# Patient Record
Sex: Female | Born: 1950 | Race: White | Hispanic: No | Marital: Married | Smoking: Never smoker
Health system: Southern US, Community
[De-identification: ages and names within clinical notes are randomized; demographics above are authoritative.]

## PROBLEM LIST (undated history)

## (undated) DIAGNOSIS — I739 Peripheral vascular disease, unspecified: Secondary | ICD-10-CM

## (undated) DIAGNOSIS — E119 Type 2 diabetes mellitus without complications: Secondary | ICD-10-CM

## (undated) DIAGNOSIS — I639 Cerebral infarction, unspecified: Secondary | ICD-10-CM

## (undated) DIAGNOSIS — E785 Hyperlipidemia, unspecified: Secondary | ICD-10-CM

## (undated) DIAGNOSIS — I10 Essential (primary) hypertension: Secondary | ICD-10-CM

## (undated) DIAGNOSIS — C801 Malignant (primary) neoplasm, unspecified: Secondary | ICD-10-CM

## (undated) DIAGNOSIS — E039 Hypothyroidism, unspecified: Secondary | ICD-10-CM

## (undated) HISTORY — PX: TONSILLECTOMY: SUR1361

## (undated) HISTORY — PX: APPENDECTOMY: SHX54

## (undated) HISTORY — PX: CORONARY ARTERY BYPASS GRAFT: SHX141

## (undated) HISTORY — DX: Cerebral infarction, unspecified: I63.9

## (undated) HISTORY — DX: Peripheral vascular disease, unspecified: I73.9

## (undated) HISTORY — PX: CATARACT EXTRACTION: SUR2

---

## 2007-03-03 ENCOUNTER — Ambulatory Visit: Payer: Self-pay | Admitting: Vascular Surgery

## 2010-06-06 NOTE — Procedures (Signed)
BYPASS GRAFT EVALUATION   INDICATION:  Followup, right fem-pop bypass graft done at [facility].   HISTORY:  Diabetes:  Yes.  Cardiac:  No.  Hypertension:  No.  Smoking:  No.  Previous Surgery:  Please see above.   SINGLE LEVEL ARTERIAL EXAM                               RIGHT              LEFT  Brachial:                                       132  Anterior tibial:             166                159  Posterior tibial:            142                147  Peroneal:  Ankle/brachial index:        1.26               1.20   PREVIOUS ABI:  Date:  RIGHT:  LEFT:   LOWER EXTREMITY BYPASS GRAFT DUPLEX EXAM:   DUPLEX:  Patent right fem-pop bypass graft with no evidence of focal  stenosis.   IMPRESSION:  1. Patent right femoropopliteal bypass graft with no evidence of focal      stenosis.  2. Normal ankle brachial index with biphasic Doppler waveform noted in      bilateral legs.  3. Status post right femoropopliteal bypass graft.  4. Right lesser saphenous vein and distal greater saphenous vein      appears to have no reflux.   ___________________________________________  Nelda Severe. Kellie Simmering, M.D.   MG/MEDQ  D:  03/03/2007  T:  03/04/2007  Job:  [id number]

## 2010-06-06 NOTE — Consult Note (Signed)
VASCULAR SURGERY CONSULTATION   Kendra Walker, Kendra Walker  DOB:  [birth year]                                       03/03/2007  [id number]   Patient is a 60 year old diabetic (type 1) female referred for vascular  consultation by Dr. Isabell Jarvis from the wound center in [place].  This 68-year-  old lady fractured her right ankle in 10/07, requiring surgery with  multiple plates and screws.  She states that she lost blood flow to  her foot two days later, and there was consideration for surgery at that  time, but because of her deteriorating condition, which included renal  insufficiency and hypotension, that was not done at that time.  She  developed some infection and had further surgery on her ankle in the  ensuing days and weeks.  Eventually, she had a vascular evaluation and  underwent a right common femoral to popliteal bypass using an in situ  saphenous vein graft in [place], [place].  She continued to have a  nonhealing ulcer over her right medial malleolus, which has been  debrided by the orthopedic surgeons in the past.  She was also seen by  Dr. Gildardo Cranker recently in evaluation from an orthopedic  standpoint, who did not recommend any further exploration at this time.  She has been treated in the wound center in [place] with hypobaric oxygen  treatments and has had multiple types of graft material apparently  placed in [place], [place] prior to her moving to [place] in 11/08.  She  continues to have a nonhealing area in the right leg.  She has had no  history of deep venous thrombosis, thrombophlebitis, or pulmonary  emboli.   PAST MEDICAL HISTORY:  1. Type 1 diabetes mellitus.  2. Coronary artery disease, status post MI in 04/04.  3. Negative for hypertension, hyperlipidemia, and congestive heart      failure.  She does have COPD.   PREVIOUS SURGERY:  1. Right femoropopliteal bypass graft in 07/08.  2. Coronary artery bypass graft, 04/04.  3. Thyroidectomy.  4.  Tonsillectomy.  5. Appendectomy.   FAMILY HISTORY:  Positive for coronary artery disease in her  grandmother, diabetes in her grandfather.  Negative for stroke.   SOCIAL HISTORY:  She is married and has one child.  Is retired.  She  does not use tobacco or alcohol.   REVIEW OF SYSTEMS:  Please see health history form.   MEDICATIONS:  Please see health history form.   ALLERGIES:  Procrit.   PHYSICAL EXAMINATION:  Blood pressure 122/68.  Heart rate is 98.  Respirations 16.  Generally, she is a middle-aged female who is in no  apparent distress.  She is alert and oriented x3.  Her neck is supple,  3+ carotid pulses are palpable.  No bruits are audible.  Neurologic:  Normal.  No palpable adenopathy in the neck.  Upper extremity pulses are  3+ bilaterally.  Chest:  Clear to auscultation.  Cardiovascular:  Regular rhythm with no murmurs.  Abdomen is soft and nontender with no  masses.  She has a well-healed median sternotomy and incisions.  Extremities exam reveals the right leg to have a 3+ femoral graft pulse  along the medial aspect of the right thigh with a well-perfused right  foot.  She does have incisions from her previous orthopedic procedure  from the ankle fracture.  There is a nonhealing ulcer over the medial  malleolus measuring about 1.5 cm in diameter, which has no purulent  drainage.  No orthopedic hardware is visible.  No bone is visible.  The  left foot is well perfused with a 2+ dorsalis pedis and posterior tibial  pulse palpable.   Lower extremity arterial Dopplers reveal an ABI of 1.2 on the right and  1.2 on the left.  Waveforms are biphasic distally, and her bypass graft  of the right leg is widely patent with no evidence of stenosis.   I reassured her regarding her arterial situation that there is no  evidence of arterial insufficiency, and the graft is working well.  Any  further treatment would be related to this wound and either plastic  surgery or  orthopedic decisions regarding coverage or further  explorations.   Kendra Walker, M.D.  Electronically Signed  JDL/MEDQ  D:  03/03/2007  T:  03/04/2007  Job:  [id number]   cc:   Wynelle Cleveland  Dr. Isabell Jarvis, [facility]

## 2010-11-27 DIAGNOSIS — E611 Iron deficiency: Secondary | ICD-10-CM | POA: Insufficient documentation

## 2010-11-27 DIAGNOSIS — Z951 Presence of aortocoronary bypass graft: Secondary | ICD-10-CM | POA: Insufficient documentation

## 2010-11-27 DIAGNOSIS — E11319 Type 2 diabetes mellitus with unspecified diabetic retinopathy without macular edema: Secondary | ICD-10-CM | POA: Insufficient documentation

## 2010-11-27 DIAGNOSIS — E875 Hyperkalemia: Secondary | ICD-10-CM | POA: Insufficient documentation

## 2011-05-14 DIAGNOSIS — N183 Chronic kidney disease, stage 3 unspecified: Secondary | ICD-10-CM | POA: Insufficient documentation

## 2011-07-19 DIAGNOSIS — S81809A Unspecified open wound, unspecified lower leg, initial encounter: Secondary | ICD-10-CM | POA: Insufficient documentation

## 2011-08-31 DIAGNOSIS — E271 Primary adrenocortical insufficiency: Secondary | ICD-10-CM | POA: Insufficient documentation

## 2011-09-27 DIAGNOSIS — D649 Anemia, unspecified: Secondary | ICD-10-CM | POA: Insufficient documentation

## 2012-01-28 DIAGNOSIS — E039 Hypothyroidism, unspecified: Secondary | ICD-10-CM | POA: Insufficient documentation

## 2013-04-28 ENCOUNTER — Encounter (HOSPITAL_COMMUNITY): Payer: Self-pay | Admitting: Pharmacy Technician

## 2013-05-08 NOTE — Discharge Instructions (Signed)
Kendra Walker  05/08/2013     Instructions    Activity: No Restrictions.   Diet: Resume Diet you were on at home.   Pain Medication: Tylenol if Needed.   CONTACT YOUR DOCTOR IF YOU HAVE PAIN, REDNESS IN YOUR EYE, OR DECREASED VISION.   Follow-up: may 12 th at 1:30  with Williams Che, MD.     Dr. Iona Hansen: [phone]         If you find that you cannot contact your physician, but feel that your signs and   Symptoms warrant a physician's attention, call the Emergency Room at   [phone] ext.[phone].

## 2013-05-11 ENCOUNTER — Ambulatory Visit (HOSPITAL_COMMUNITY)
Admission: RE | Admit: 2013-05-11 | Discharge: 2013-05-11 | Disposition: A | Discharge status: Home / Self Care | Payer: Medicare PPO | Source: Ambulatory Visit | Attending: Ophthalmology | Admitting: Ophthalmology

## 2013-05-11 ENCOUNTER — Encounter (HOSPITAL_COMMUNITY)
Admission: RE | Disposition: A | Discharge status: Home / Self Care | Payer: Self-pay | Source: Ambulatory Visit | Attending: Ophthalmology

## 2013-05-11 ENCOUNTER — Encounter (HOSPITAL_COMMUNITY): Payer: Self-pay | Admitting: *Deleted

## 2013-05-11 DIAGNOSIS — H26499 Other secondary cataract, unspecified eye: Secondary | ICD-10-CM | POA: Diagnosis not present

## 2013-05-11 HISTORY — DX: Hypothyroidism, unspecified: E03.9

## 2013-05-11 HISTORY — PX: YAG LASER APPLICATION: SHX6189

## 2013-05-11 HISTORY — DX: Hyperlipidemia, unspecified: E78.5

## 2013-05-11 HISTORY — DX: Type 2 diabetes mellitus without complications: E11.9

## 2013-05-11 HISTORY — DX: Essential (primary) hypertension: I10

## 2013-05-11 SURGERY — TREATMENT, USING YAG LASER
Anesthesia: LOCAL | Laterality: Right

## 2013-05-11 MED ORDER — TETRACAINE HCL 0.5 % OP SOLN
OPHTHALMIC | Status: AC
  Filled 2013-05-11: qty 2

## 2013-05-11 MED ORDER — APRACLONIDINE HCL 1 % OP SOLN
OPHTHALMIC | Status: AC
  Filled 2013-05-11: qty 0.1

## 2013-05-11 MED ORDER — APRACLONIDINE HCL 1 % OP SOLN
1.0000 [drp] | OPHTHALMIC | Status: AC
  Administered 2013-05-11 (×3): 1 [drp] via OPHTHALMIC

## 2013-05-11 MED ORDER — TROPICAMIDE 1 % OP SOLN
1.0000 [drp] | OPHTHALMIC | Status: AC
  Administered 2013-05-11 (×3): 1 [drp] via OPHTHALMIC

## 2013-05-11 MED ORDER — TROPICAMIDE 1 % OP SOLN
OPHTHALMIC | Status: AC
  Filled 2013-05-11: qty 3

## 2013-05-11 MED ORDER — TETRACAINE HCL 0.5 % OP SOLN
1.0000 [drp] | Freq: Once | OPHTHALMIC | Status: AC
  Administered 2013-05-11: 1 [drp] via OPHTHALMIC

## 2013-05-11 NOTE — Brief Op Note (Signed)
Kendra Walker 05/11/2013  Williams Che, MD  Yag Laser Self Test Completedyes. Procedure: Posterior Capsulotomy, right eye.  Eye Protection Worn by Staff yes. Laser In Use Sign on Door yes.  Laser: Nd:YAG Spot Size: Fixed Burst Mode: III Power Setting: 1.6 mJ/burst Position treated: posterior capsule Number of shots: 38  Total energy delivered: 60.8 mJ  The patient tolerated the procedure without difficulty. No complications were encountered.   Patient verbalizes understanding of discharge instructions yes.   Notes:moderately opacified posterior capsule

## 2013-05-11 NOTE — H&P (Signed)
I have reviewed the pre printed H&P, the patient was re-examined, and I have identified no significant interval changes in the patient's medical condition.  There is no change in the plan of care since the history and physical of record. 

## 2013-05-12 DIAGNOSIS — R8281 Pyuria: Secondary | ICD-10-CM | POA: Insufficient documentation

## 2013-05-13 ENCOUNTER — Encounter (HOSPITAL_COMMUNITY): Payer: Self-pay | Admitting: Ophthalmology

## 2013-05-25 NOTE — Op Note (Signed)
Procedure- no op note required

## 2013-11-11 ENCOUNTER — Encounter: Payer: Self-pay | Admitting: Cardiology

## 2013-11-11 ENCOUNTER — Ambulatory Visit (INDEPENDENT_AMBULATORY_CARE_PROVIDER_SITE_OTHER): Payer: 59 | Admitting: Cardiology

## 2013-11-11 VITALS — BP 113/68 | HR 73 | Ht 61.0 in | Wt 121.0 lb

## 2013-11-11 DIAGNOSIS — I2510 Atherosclerotic heart disease of native coronary artery without angina pectoris: Secondary | ICD-10-CM

## 2013-11-11 DIAGNOSIS — I739 Peripheral vascular disease, unspecified: Secondary | ICD-10-CM | POA: Insufficient documentation

## 2013-11-11 DIAGNOSIS — E785 Hyperlipidemia, unspecified: Secondary | ICD-10-CM | POA: Insufficient documentation

## 2013-11-11 DIAGNOSIS — Z136 Encounter for screening for cardiovascular disorders: Secondary | ICD-10-CM

## 2013-11-11 DIAGNOSIS — I10 Essential (primary) hypertension: Secondary | ICD-10-CM

## 2013-11-11 MED ORDER — ATORVASTATIN CALCIUM 80 MG PO TABS: 80.0000 mg | ORAL_TABLET | Freq: Every day | ORAL | Status: DC

## 2013-11-11 NOTE — Progress Notes (Addendum)
Clinical Summary Ms. Kelsay is a 63 y.o.female seen today for follow up of the following medical problems.   1. CAD -  previously followed by [facility]. From ntoes MI in 2004 requiring 2 vessel CABG (anatomy not described) in [place].  - LVEF March 2011 reported normal LVEF. Echo 04/2011 LVEF 55-60%. - denies any chest pain, denies any significant DOE. Highest level of activity is housework, which she tolerates well. - compliant with meds. She has been on plavix for unclear reasons, the exact history/indication is unclear. Reports heavy bruising on ASA/plavix.    2. PAD - hx of right common femoral to popliteal bypass in [place] - denies any claudication like pain  3. Hyperlipidemia - 07/2013 TC 212 TG 89 HDL 96 LDL 96 - compliant with statin  4. 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 Adrenal insufficiency - followed by endocrine  6.HTN - compliant with meds  7. CKD - followed by pcp  Past Medical History  Diagnosis Date  . Diabetes mellitus without complication   . Hypothyroidism   . Hyperlipemia   . Hypertension      Allergies  Allergen Reactions  . Bactrim [Sulfamethoxazole-Tmp Ds] Other (See Comments)    Elevates potassium.   . Tape Other (See Comments)    not allergic but causes sensitivity.  . Fluorescein Rash  . Procrit [Epoetin (Alfa)] Rash     Current Outpatient Prescriptions  Medication Sig Dispense Refill  . acetaminophen (TYLENOL) 500 MG tablet Take 500 mg by mouth daily as needed for mild pain or headache.      . Ascorbic Acid (VITAMIN C PO) Take 1 tablet by mouth daily.      Marland Kitchen aspirin EC 81 MG tablet Take 81 mg by mouth daily.      . cholecalciferol (VITAMIN D) 1000 UNITS tablet Take 1,000 Units by mouth daily.      . clopidogrel (PLAVIX) 75 MG tablet Take 75 mg by mouth daily with breakfast.      . FLUDROCORTISONE ACETATE PO Take 0.5 tablets by mouth daily.      . insulin aspart (NOVOLOG) 100 UNIT/ML injection Inject  into the skin as directed. Per insulin pump.      . Insulin Human (INSULIN PUMP) SOLN Inject into the skin.      Marland Kitchen iron polysaccharides (FERREX 150) 150 MG capsule Take 150 mg by mouth daily.      Marland Kitchen levothyroxine (SYNTHROID, LEVOTHROID) 125 MCG tablet Take 125 mcg by mouth daily before breakfast.      . magnesium chloride (SLOW-MAG) 64 MG TBEC SR tablet Take 1 tablet by mouth 2 (two) times daily.      . Multiple Vitamin (MULTIVITAMIN WITH MINERALS) TABS tablet Take 1 tablet by mouth daily.      . Multiple Vitamins-Minerals (PRESERVISION AREDS 2 PO) Take 1 tablet by mouth every other day.      . Omega-3 Fatty Acids (FISH OIL) 1000 MG CAPS Take 1 capsule by mouth daily.      . predniSONE (DELTASONE) 5 MG tablet Take 5 mg by mouth daily with breakfast.      . simvastatin (ZOCOR) 40 MG tablet Take 40 mg by mouth at bedtime.      . sodium polystyrene (KAYEXALATE) 15 GM/60ML suspension Take 15 g by mouth once.       No current facility-administered medications for this visit.     Past Surgical History  Procedure Laterality Date  . Tonsillectomy    .  Appendectomy    . Coronary artery bypass graft    . Cataract extraction Bilateral   . Yag laser application Right 0000000    Procedure: YAG LASER APPLICATION;  Surgeon: Williams Che, MD;  Location: [facility];  Service: Ophthalmology;  Laterality: Right;     Allergies  Allergen Reactions  . Bactrim [Sulfamethoxazole-Tmp Ds] Other (See Comments)    Elevates potassium.   . Tape Other (See Comments)    not allergic but causes sensitivity.  . Fluorescein Rash  . Procrit [Epoetin (Alfa)] Rash      No family history on file.   Social History Ms. Muzio reports that she has never smoked. She does not have any smokeless tobacco history on file. Ms. Matusek has no alcohol history on file.   Review of Systems CONSTITUTIONAL: No weight loss, fever, chills, weakness or fatigue.  HEENT: Eyes: No visual loss, blurred vision, double vision or  yellow sclerae.No hearing loss, sneezing, congestion, runny nose or sore throat.  SKIN: No rash or itching.  CARDIOVASCULAR: per HPI RESPIRATORY: No shortness of breath, cough or sputum.  GASTROINTESTINAL: No anorexia, nausea, vomiting or diarrhea. No abdominal pain or blood.  GENITOURINARY: No burning on urination, no polyuria NEUROLOGICAL: No headache, dizziness, syncope, paralysis, ataxia, numbness or tingling in the extremities. No change in bowel or bladder control.  MUSCULOSKELETAL: No muscle, back pain, joint pain or stiffness.  LYMPHATICS: No enlarged nodes. No history of splenectomy.  PSYCHIATRIC: No history of depression or anxiety.  ENDOCRINOLOGIC: No reports of sweating, cold or heat intolerance. No polyuria or polydipsia.  Marland Kitchen   Physical Examination p 73 bp 113/68 Wt 121 lbs BMI 23 Gen: resting comfortably, no acute distress HEENT: no scleral icterus, pupils equal round and reactive, no palptable cervical adenopathy,  CV: RRR, no m/r/g, no JVD, no carotid bruits Resp: Clear to auscultation bilaterally GI: abdomen is soft, non-tender, non-distended, normal bowel sounds, no hepatosplenomegaly MSK: extremities are warm, no edema.  Skin: warm, no rash Neuro:  no focal deficits Psych: appropriate affect   Diagnostic Studies  04/2011 Echo FINDINGS:  LEFT VENTRICLE The left ventricular size is normal. There is normal left ventricular wall  thickness. Left ventricular systolic function is normal. LV ejection fraction  = 55-60%. Left ventricular filling pattern is indeterminate. The left  ventricular wall motion is normal. LV WALL MOTION -  RIGHT VENTRICLE The right ventricle is normal in size and function. The right ventricular  systolic function is normal. The right ventricular wall motion is normal.  LEFT ATRIUM The left atrium is borderline dilated.  RIGHT ATRIUM  Right atrial size is normal. - AORTIC VALVE The aortic valve is normal in structure and function.  The aortic valve opens  well. Trace (trivial) aortic regurgitation. - MITRAL VALVE The mitral valve leaflets appear normal. There is no evidence of stenosis,  fluttering, or prolapse. There is mild mitral regurgitation. - TRICUSPID VALVE Structurally normal tricuspid valve. There is mild tricuspid regurgitation. - PULMONIC VALVE The pulmonic valve is normal in structure and function. Trace pulmonic  valvular regurgitation. - ARTERIES The aortic root is normal size. - VENOUS Pulmonary venous flow pattern is blunted. IVC size was normal. - EFFUSION There is no pericardial effusion. - - MMode/2D Measurements & Calculations IVSd: 0.95 cm LVIDd: 3.9 cm LVPWd: 0.99 cm LVIDs: 2.4 cm LA dim: 3.8 cm Ao root: 2.5 cm EDV(MOD-sp4): 24.0 ml ESV(MOD-sp4): 11.0 ml LVOT diam: 1.5 cm LAV(MOD-sp4): 41.5 ml Doppler Measurements & Calculations MV E max vel: 114.0  cm/sec MV A max vel: 82.4 cm/sec MV E/A: 1.4 Med Peak E' Vel: 7.6 cm/sec Lat Peak E' Vel: 12.2 cm/sec E/Lat E`: 9.4 E/Med E`: 15.0 MV dec time: 0.16 sec SV(LVOT): 45.4 ml Ao mean PG: 7.1 mmHg AVA (VTI): 1.3 cm2 LV V1 VTI: 27.2 cm TR max vel: 230.8 cm/sec TR max PG: 21.3 mmHg RVSP(TR): 26.3 mmHg RAP systole: 5.0 mmHg   11/11/13 EKG NSR  Assessment and Plan   1. CAD - denies any current symptoms, normal LVEF by last echo 2013 - no indication for continued plavix, reports heavy bruising. Will stop. - continue risk factor modification and secondary prevention  2. PAD - no current claudication symptoms, continue risk factor modification and secondary prevention  3. Hyperlipidemia - given known hx of cardiovascular disease, recommendation would be for high dose statin. Stop zocor, start atorva 80 mg daily.  4. HTN - at goal, continue current meds     Arnoldo Lenis, M.D.

## 2013-11-11 NOTE — Patient Instructions (Signed)
   Stop Zocor  Start Atorvastatin 80mg  daily. Sent to pharmacy. Continue all other medications.   Your physician wants you to follow up in:  1 year.  You will receive a reminder letter in the mail one-two months in advance.  If you don't receive a letter, please call our office to schedule the follow up appointment.

## 2014-11-15 ENCOUNTER — Encounter: Payer: Self-pay | Admitting: *Deleted

## 2014-11-15 ENCOUNTER — Ambulatory Visit (INDEPENDENT_AMBULATORY_CARE_PROVIDER_SITE_OTHER): Payer: Medicare HMO | Admitting: Cardiology

## 2014-11-15 ENCOUNTER — Encounter: Payer: Self-pay | Admitting: Cardiology

## 2014-11-15 VITALS — BP 153/72 | HR 72 | Ht 61.0 in | Wt 113.1 lb

## 2014-11-15 DIAGNOSIS — E785 Hyperlipidemia, unspecified: Secondary | ICD-10-CM | POA: Diagnosis not present

## 2014-11-15 DIAGNOSIS — I10 Essential (primary) hypertension: Secondary | ICD-10-CM

## 2014-11-15 DIAGNOSIS — I2510 Atherosclerotic heart disease of native coronary artery without angina pectoris: Secondary | ICD-10-CM | POA: Diagnosis not present

## 2014-11-15 DIAGNOSIS — R011 Cardiac murmur, unspecified: Secondary | ICD-10-CM

## 2014-11-15 DIAGNOSIS — I739 Peripheral vascular disease, unspecified: Secondary | ICD-10-CM

## 2014-11-15 NOTE — Patient Instructions (Signed)
Your physician recommends that you schedule a follow-up appointment in: 6 weeks with Dr. Harl Bowie  Your physician has recommended you make the following change in your medication:   [place] physician has requested that you have an echocardiogram. Echocardiography is a painless test that uses sound waves to create images of your heart. It provides your doctor with information about the size and shape of your heart and how well your heart's chambers and valves are working. This procedure takes approximately one hour. There are no restrictions for this procedure.  Thank you for choosing [facility]!!

## 2014-11-15 NOTE — Progress Notes (Signed)
Patient ID: Kendra Walker, female   DOB: 27-Mar-1950, 63 y.o.   MRN: [id number]     Clinical Summary Kendra Walker is a 64 y.o.female seen today for follow up of the following medical problems.   1. CAD - previously followed by Kendra Walker. From notes MI in 2004 requiring 2 vessel CABG (anatomy not described) in [place] Walker.  - LVEF March 2011 reported normal LVEF. Echo 04/2011 LVEF 55-60%.  - denies any chest pain. Noticed some DOE with activities, example with housework like vacuuming or sweeping. No LE edema, no orthopnea - compliant with meds. She remains on both ASA and plavix. Last clinic note mentions stopping plavix however she reports this was not communicated to her.   2. PAD - hx of right common femoral to popliteal bypass in [place] - denies any claudication like pain. No foot sores  3. Hyperlipidemia - 07/2013 TC 212 TG 89 HDL 96 LDL 96 - compliant with statin  4. 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 Adrenal insufficiency - followed by endocrine  6.HTN - does not check regularly at home - compliant with meds  7. CKD - followed by pcp Past Medical History  Diagnosis Date  . Diabetes mellitus without complication   . Hypothyroidism   . Hyperlipemia   . Hypertension      Allergies  Allergen Reactions  . Bactrim [Sulfamethoxazole-Trimethoprim] Other (See Comments)    Elevates potassium.   . Tape Other (See Comments)    not allergic but causes sensitivity.  . Fluorescein Rash  . Procrit [Epoetin (Alfa)] Rash     Current Outpatient Prescriptions  Medication Sig Dispense Refill  . acetaminophen (TYLENOL) 500 MG tablet Take 500 mg by mouth daily as needed for mild pain or headache.    . Ascorbic Acid (VITAMIN C PO) Take 1 tablet by mouth daily.    Marland Kitchen aspirin EC 81 MG tablet Take 81 mg by mouth daily.    Marland Kitchen atorvastatin (LIPITOR) 80 MG tablet Take 1 tablet (80 mg total) by mouth daily. 30 tablet 6  . cholecalciferol (VITAMIN D) 1000 UNITS  tablet Take 1,000 Units by mouth daily.    . clopidogrel (PLAVIX) 75 MG tablet Take 75 mg by mouth daily with breakfast.    . fludrocortisone (FLORINEF) 0.1 MG tablet Take 0.5 tablets by mouth daily.    . insulin aspart (NOVOLOG) 100 UNIT/ML injection Inject into the skin as directed. Per insulin pump.    . Insulin Human (INSULIN PUMP) SOLN Inject into the skin.    Marland Kitchen iron polysaccharides (FERREX 150) 150 MG capsule Take 150 mg by mouth daily.    Marland Kitchen levothyroxine (SYNTHROID, LEVOTHROID) 125 MCG tablet Take 125 mcg by mouth daily before breakfast.    . magnesium chloride (SLOW-MAG) 64 MG TBEC SR tablet Take 1 tablet by mouth 2 (two) times daily.    . Multiple Vitamin (MULTIVITAMIN WITH MINERALS) TABS tablet Take 1 tablet by mouth daily.    . Multiple Vitamins-Minerals (PRESERVISION AREDS 2 PO) Take 1 tablet by mouth daily.     . Omega-3 Fatty Acids (FISH OIL) 1000 MG CAPS Take 1 capsule by mouth daily.    . predniSONE (DELTASONE) 5 MG tablet Take 5 mg by mouth daily with breakfast.    . sodium polystyrene (KAYEXALATE) 15 GM/60ML suspension Take 15 g by mouth 2 (two) times a week.      No current facility-administered medications for this visit.     Past Surgical History  Procedure Laterality Date  . Tonsillectomy    . Appendectomy    . Coronary artery bypass graft    . Cataract extraction Bilateral   . Yag laser application Right 0000000    Procedure: YAG LASER APPLICATION;  Surgeon: Kendra Che, MD;  Location: [facility];  Service: Ophthalmology;  Laterality: Right;     Allergies  Allergen Reactions  . Bactrim [Sulfamethoxazole-Trimethoprim] Other (See Comments)    Elevates potassium.   . Tape Other (See Comments)    not allergic but causes sensitivity.  . Fluorescein Rash  . Procrit [Epoetin (Alfa)] Rash      No family history on file.   Social History [name] reports that she has never smoked. She has never used smokeless tobacco. [name] has no alcohol history  on file.   Review of Systems CONSTITUTIONAL: No weight loss, fever, chills, weakness or fatigue.  HEENT: Eyes: No visual loss, blurred vision, double vision or yellow sclerae.No hearing loss, sneezing, congestion, runny nose or sore throat.  SKIN: No rash or itching.  CARDIOVASCULAR: per HPI RESPIRATORY: No shortness of breath, cough or sputum.  GASTROINTESTINAL: No anorexia, nausea, vomiting or diarrhea. No abdominal pain or blood.  GENITOURINARY: No burning on urination, no polyuria NEUROLOGICAL: No headache, dizziness, syncope, paralysis, ataxia, numbness or tingling in the extremities. No change in bowel or bladder control.  MUSCULOSKELETAL: No muscle, back pain, joint pain or stiffness.  LYMPHATICS: No enlarged nodes. No history of splenectomy.  PSYCHIATRIC: No history of depression or anxiety.  ENDOCRINOLOGIC: No reports of sweating, cold or heat intolerance. No polyuria or polydipsia.  Marland Kitchen   Physical Examination Filed Vitals:   11/15/14 0853  BP: 153/72  Pulse: 72   Filed Vitals:   11/15/14 0853  Height: 5\' 1"  (1.549 m)  Weight: 113 lb 1.9 oz (51.311 kg)   Manual bp 128/70 Gen: resting comfortably, no acute distress HEENT: no scleral icterus, pupils equal round and reactive, no palptable cervical adenopathy,  CV: RRR, 2/6 systolic murmur RUSB, no jvd Resp: Clear to auscultation bilaterally GI: abdomen is soft, non-tender, non-distended, normal bowel sounds, no hepatosplenomegaly MSK: extremities are warm, no edema.  Skin: warm, no rash Neuro:  no focal deficits Psych: appropriate affect   Diagnostic Studies 04/2011 Echo FINDINGS:  LEFT VENTRICLE The left ventricular size is normal. There is normal left ventricular wall  thickness. Left ventricular systolic function is normal. LV ejection fraction  = 55-60%. Left ventricular filling pattern is indeterminate. The left  ventricular wall motion is normal. LV WALL MOTION -  RIGHT VENTRICLE The right ventricle is  normal in size and function. The right ventricular  systolic function is normal. The right ventricular wall motion is normal.  LEFT ATRIUM The left atrium is borderline dilated.  RIGHT ATRIUM  Right atrial size is normal. - AORTIC VALVE The aortic valve is normal in structure and function. The aortic valve opens  well. Trace (trivial) aortic regurgitation. - MITRAL VALVE The mitral valve leaflets appear normal. There is no evidence of stenosis,  fluttering, or prolapse. There is mild mitral regurgitation. - TRICUSPID VALVE Structurally normal tricuspid valve. There is mild tricuspid regurgitation. - PULMONIC VALVE The pulmonic valve is normal in structure and function. Trace pulmonic  valvular regurgitation. - ARTERIES The aortic root is normal size. - VENOUS Pulmonary venous flow pattern is blunted. IVC size was normal. - EFFUSION There is no pericardial effusion. - - MMode/2D Measurements & Calculations IVSd: 0.95 cm LVIDd: 3.9 cm LVPWd: 0.99 cm  LVIDs: 2.4 cm LA dim: 3.8 cm Ao root: 2.5 cm EDV(MOD-sp4): 24.0 ml ESV(MOD-sp4): 11.0 ml LVOT diam: 1.5 cm LAV(MOD-sp4): 41.5 ml Doppler Measurements & Calculations MV E max vel: 114.0 cm/sec MV A max vel: 82.4 cm/sec MV E/A: 1.4 Med Peak E' Vel: 7.6 cm/sec Lat Peak E' Vel: 12.2 cm/sec E/Lat E`: 9.4 E/Med E`: 15.0 MV dec time: 0.16 sec SV(LVOT): 45.4 ml Ao mean PG: 7.1 mmHg AVA (VTI): 1.3 cm2 LV V1 VTI: 27.2 cm TR max vel: 230.8 cm/sec TR max PG: 21.3 mmHg RVSP(TR): 26.3 mmHg RAP systole: 5.0 mmHg      Assessment and Plan  1. CAD - no chest pain but does note some DOE with recent activities - pending echo result for heart murmur may require stress testing. She reports last stress test was at [facility] several years ago.   2. PAD - no current claudication symptoms, continue risk factor modification and secondary prevention  3. Hyperlipidemia -continue high dose statin.    4. HTN - at goal, not  currently requiring bp meds. Due to adrenal insufficiency prior issues have been more related to hypotension  5. Heart murmur - obtain echo    F/u 6 weeks  Arnoldo Lenis, M.D.,

## 2014-12-01 ENCOUNTER — Other Ambulatory Visit: Payer: Self-pay

## 2014-12-01 ENCOUNTER — Ambulatory Visit (INDEPENDENT_AMBULATORY_CARE_PROVIDER_SITE_OTHER): Payer: Medicare HMO

## 2014-12-01 DIAGNOSIS — R011 Cardiac murmur, unspecified: Secondary | ICD-10-CM | POA: Diagnosis not present

## 2014-12-03 ENCOUNTER — Telehealth: Payer: Self-pay | Admitting: *Deleted

## 2014-12-03 NOTE — Telephone Encounter (Signed)
Pt aware, routed to pcp, confirmed 12/7 appt

## 2014-12-03 NOTE — Telephone Encounter (Signed)
-----   Message from Arnoldo Lenis, MD sent at 12/03/2014 11:36 AM EST ----- Overall echo looks good. Her aortic valve is a little thickened but works well, this is the cause of her murmur. Will discuss results further at f/u  Zandra Abts MD

## 2014-12-29 ENCOUNTER — Encounter: Payer: Self-pay | Admitting: *Deleted

## 2014-12-29 ENCOUNTER — Encounter: Payer: Medicare HMO | Admitting: Cardiology

## 2014-12-29 ENCOUNTER — Encounter: Payer: Self-pay | Admitting: Cardiology

## 2014-12-29 VITALS — BP 103/66 | HR 82 | Ht 61.0 in | Wt 113.0 lb

## 2014-12-29 DIAGNOSIS — I2510 Atherosclerotic heart disease of native coronary artery without angina pectoris: Secondary | ICD-10-CM

## 2014-12-29 DIAGNOSIS — R0602 Shortness of breath: Secondary | ICD-10-CM

## 2014-12-29 NOTE — Patient Instructions (Signed)
Your physician recommends that you schedule a follow-up appointment TO BE DETERMINED AFTER TESTING  Your physician recommends that you continue on your current medications as directed. Please refer to the Current Medication list given to you today.  Your physician has requested that you have a lexiscan myoview. For further information please visit [email or web address]. Please follow instruction sheet, as given.  Thank you for choosing [facility]!!   

## 2014-12-29 NOTE — Progress Notes (Addendum)
Patient ID: Kendra Walker, female   DOB: 12-17-1950, 64 y.o.   MRN: [id number]      Clinical Summary Kendra Walker is a 64 y.o.female seen today for focused visit on her history of CAD and recent chest pain and SOB.   1. CAD - previously followed by [facility]. 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 - LVEF March 2011 reported normal LVEF. Echo 04/2011 LVEF 55-60%. - 11/2014 echo LVEF 60-65%, mod AI  - reports some DOE with activities, example with housework like vacuuming or sweeping. No LE edema, no orthopnea  Past Medical History  Diagnosis Date  . Diabetes mellitus without complication ([place])   . Hypothyroidism   . Hyperlipemia   . Hypertension      Allergies  Allergen Reactions  . Bactrim [Sulfamethoxazole-Trimethoprim] Other (See Comments)    Elevates potassium.   . Tape Other (See Comments)    not allergic but causes sensitivity.  . Fluorescein Rash  . Procrit [Epoetin (Alfa)] Rash     Current Outpatient Prescriptions  Medication Sig Dispense Refill  . acetaminophen (TYLENOL) 500 MG tablet Take 500 mg by mouth daily as needed for mild pain or headache.    . Ascorbic Acid (VITAMIN C PO) Take 1 tablet by mouth daily.    Marland Kitchen aspirin EC 81 MG tablet Take 81 mg by mouth daily.    Marland Kitchen atorvastatin (LIPITOR) 80 MG tablet Take 1 tablet (80 mg total) by mouth daily. 30 tablet 6  . cholecalciferol (VITAMIN D) 1000 UNITS tablet Take 1,000 Units by mouth daily.    . fludrocortisone (FLORINEF) 0.1 MG tablet Take 0.5 tablets by mouth daily.    . insulin aspart (NOVOLOG) 100 UNIT/ML injection Inject into the skin as directed. Per insulin pump.    . Insulin Human (INSULIN PUMP) SOLN Inject into the skin.    Marland Kitchen iron polysaccharides (FERREX 150) 150 MG capsule Take 150 mg by mouth daily.    Marland Kitchen levothyroxine (SYNTHROID, LEVOTHROID) 100 MCG tablet Take 1 tablet by mouth daily.    . magnesium chloride (SLOW-MAG) 64 MG TBEC SR tablet Take 1 tablet by  mouth 3 (three) times daily.     . Multiple Vitamin (MULTIVITAMIN WITH MINERALS) TABS tablet Take 1 tablet by mouth daily.    . Multiple Vitamins-Minerals (PRESERVISION AREDS 2 PO) Take 1 tablet by mouth daily.     . Omega-3 Fatty Acids (FISH OIL) 1000 MG CAPS Take 1 capsule by mouth daily.    . predniSONE (DELTASONE) 5 MG tablet Take 5 mg by mouth daily with breakfast.    . sodium polystyrene (KAYEXALATE) 15 GM/60ML suspension Take 15 g by mouth 2 (two) times a week.      No current facility-administered medications for this visit.     Past Surgical History  Procedure Laterality Date  . Tonsillectomy    . Appendectomy    . Coronary artery bypass graft    . Cataract extraction Bilateral   . Yag laser application Right 0000000    Procedure: YAG LASER APPLICATION;  Surgeon: Williams Che, MD;  Location: [facility];  Service: Ophthalmology;  Laterality: Right;     Allergies  Allergen Reactions  . Bactrim [Sulfamethoxazole-Trimethoprim] Other (See Comments)    Elevates potassium.   . Tape Other (See Comments)    not allergic but causes sensitivity.  . Fluorescein Rash  . Procrit [Epoetin (Alfa)] Rash      No family history on file.  Social History Kendra Walker reports that she has never smoked. She has never used smokeless tobacco. Kendra Walker has no alcohol history on file.   Review of Systems CONSTITUTIONAL: No weight loss, fever, chills, weakness or fatigue.  HEENT: Eyes: No visual loss, blurred vision, double vision or yellow sclerae.No hearing loss, sneezing, congestion, runny nose or sore throat.  SKIN: No rash or itching.  CARDIOVASCULAR: per HPI RESPIRATORY: No cough or sputum.  GASTROINTESTINAL: No anorexia, nausea, vomiting or diarrhea. No abdominal pain or blood.  GENITOURINARY: No burning on urination, no polyuria NEUROLOGICAL: No headache, dizziness, syncope, paralysis, ataxia, numbness or tingling in the extremities. No change in bowel or bladder control.    MUSCULOSKELETAL: No muscle, back pain, joint pain or stiffness.  LYMPHATICS: No enlarged nodes. No history of splenectomy.  PSYCHIATRIC: No history of depression or anxiety.  ENDOCRINOLOGIC: No reports of sweating, cold or heat intolerance. No polyuria or polydipsia.  Marland Kitchen   Physical Examination Filed Vitals:   12/29/14 1043  BP: 103/66  Pulse: 82   Filed Weights   12/29/14 1043  Weight: 113 lb (51.256 kg)    Gen: resting comfortably, no acute distress HEENT: no scleral icterus, pupils equal round and reactive, no palptable cervical adenopathy,  CV: RRR, 2/6 systolic murmur RUSB, no jVD Resp: Clear to auscultation bilaterally GI: abdomen is soft, non-tender, non-distended, normal bowel sounds, no hepatosplenomegaly MSK: extremities are warm, no edema.  Skin: warm, no rash Neuro:  no focal deficits Psych: appropriate affect    Assessment and Plan  1. CAD - recent signiifcant DOE. Echo without potential etiology. Her last stress test was in 2011, a negative DSE at [facility]. Given her symptoms will obtain Lexiscan   F/u pending stress results      Arnoldo Lenis, M.D.

## 2014-12-31 NOTE — Addendum Note (Signed)
Addended by: Carlyle Dolly F on: 12/31/2014 08:04 PM   Modules accepted: Level of Service

## 2015-01-04 ENCOUNTER — Encounter (HOSPITAL_COMMUNITY): Payer: Medicare HMO

## 2015-01-10 ENCOUNTER — Encounter (HOSPITAL_COMMUNITY)
Admission: RE | Admit: 2015-01-10 | Discharge: 2015-01-10 | Disposition: A | Discharge status: Home / Self Care | Payer: Medicare HMO | Source: Ambulatory Visit | Attending: Cardiology | Admitting: Cardiology

## 2015-01-10 ENCOUNTER — Inpatient Hospital Stay (HOSPITAL_COMMUNITY): Admission: RE | Admit: 2015-01-10 | Payer: Medicare HMO | Source: Ambulatory Visit

## 2015-01-10 ENCOUNTER — Encounter (HOSPITAL_COMMUNITY): Payer: Self-pay

## 2015-01-10 DIAGNOSIS — R0602 Shortness of breath: Secondary | ICD-10-CM | POA: Insufficient documentation

## 2015-01-10 HISTORY — DX: Malignant (primary) neoplasm, unspecified: C80.1

## 2015-01-10 LAB — NM MYOCAR MULTI W/SPECT W/WALL MOTION / EF
LV dias vol: 51 mL
LV sys vol: 15 mL
Peak HR: 93 {beats}/min
RATE: 0
Rest HR: 74 {beats}/min
SDS: 2
SRS: 0
SSS: 2
TID: 0.98

## 2015-01-10 IMAGING — NM NM MYOCAR MULTI W/SPECT W/WALL MOTION & EF
2 series · 12 of 12 positions shown · non-contrast
Comparison: none

[Series 1: rest · 8.28mm/px · 6 of 64 frames shown]
[frame 6/64]
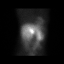
[frame 16/64]
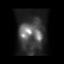
[frame 27/64]
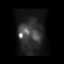
[frame 38/64]
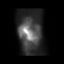
[frame 48/64]
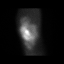
[frame 59/64]
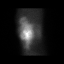

[Series 2: stress gated · 8.28mm/px · 6 of 64 frames shown]
[frame 6/64]
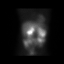
[frame 16/64]
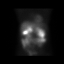
[frame 27/64]
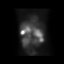
[frame 38/64]
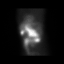
[frame 48/64]
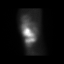
[frame 59/64]
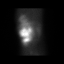

[12 of 12 positions shown; findings below may reference images not displayed]

Canned report from images found in remote index.

Refer to host system for actual result text.

## 2015-01-10 MED ORDER — SODIUM CHLORIDE 0.9 % IJ SOLN
INTRAMUSCULAR | Status: AC
  Administered 2015-01-10: 10 mL via INTRAVENOUS
  Filled 2015-01-10: qty 3

## 2015-01-10 MED ORDER — REGADENOSON 0.4 MG/5ML IV SOLN
INTRAVENOUS | Status: AC
  Administered 2015-01-10: 0.4 mg via INTRAVENOUS
  Filled 2015-01-10: qty 5

## 2015-01-10 MED ORDER — TECHNETIUM TC 99M SESTAMIBI - CARDIOLITE
10.0000 | Freq: Once | INTRAVENOUS | Status: AC | PRN
  Administered 2015-01-10: 10.8 via INTRAVENOUS

## 2015-01-10 MED ORDER — TECHNETIUM TC 99M SESTAMIBI GENERIC - CARDIOLITE
30.0000 | Freq: Once | INTRAVENOUS | Status: AC | PRN
  Administered 2015-01-10: 30 via INTRAVENOUS

## 2015-01-11 ENCOUNTER — Telehealth: Payer: Self-pay | Admitting: *Deleted

## 2015-01-11 NOTE — Telephone Encounter (Signed)
Pt aware, routed to pcp, scheduled 6 week f/u

## 2015-01-11 NOTE — Telephone Encounter (Signed)
-----   Message from Arnoldo Lenis, MD sent at 01/11/2015  1:04 PM EST ----- Stress test looks good, no evidence of new blockages. I would ask she follow up with her pcp to discuss noncardiac causes of her SOB as her heart studies have come back ok. I'd like to see her back in 6 weeks.   Zandra Abts MD

## 2015-01-27 DIAGNOSIS — E271 Primary adrenocortical insufficiency: Secondary | ICD-10-CM | POA: Diagnosis not present

## 2015-01-27 DIAGNOSIS — Z9641 Presence of insulin pump (external) (internal): Secondary | ICD-10-CM | POA: Diagnosis not present

## 2015-01-27 DIAGNOSIS — E2740 Unspecified adrenocortical insufficiency: Secondary | ICD-10-CM | POA: Diagnosis not present

## 2015-01-27 DIAGNOSIS — N189 Chronic kidney disease, unspecified: Secondary | ICD-10-CM | POA: Diagnosis not present

## 2015-01-27 DIAGNOSIS — E10319 Type 1 diabetes mellitus with unspecified diabetic retinopathy without macular edema: Secondary | ICD-10-CM | POA: Diagnosis not present

## 2015-01-27 DIAGNOSIS — E1065 Type 1 diabetes mellitus with hyperglycemia: Secondary | ICD-10-CM | POA: Diagnosis not present

## 2015-02-22 DIAGNOSIS — E039 Hypothyroidism, unspecified: Secondary | ICD-10-CM | POA: Diagnosis not present

## 2015-02-22 DIAGNOSIS — E1121 Type 2 diabetes mellitus with diabetic nephropathy: Secondary | ICD-10-CM | POA: Diagnosis not present

## 2015-02-22 DIAGNOSIS — I10 Essential (primary) hypertension: Secondary | ICD-10-CM | POA: Diagnosis not present

## 2015-02-22 DIAGNOSIS — D631 Anemia in chronic kidney disease: Secondary | ICD-10-CM | POA: Diagnosis not present

## 2015-02-22 DIAGNOSIS — E782 Mixed hyperlipidemia: Secondary | ICD-10-CM | POA: Diagnosis not present

## 2015-02-23 ENCOUNTER — Encounter: Payer: Self-pay | Admitting: Cardiology

## 2015-02-23 ENCOUNTER — Encounter: Payer: Self-pay | Admitting: *Deleted

## 2015-02-23 ENCOUNTER — Ambulatory Visit (INDEPENDENT_AMBULATORY_CARE_PROVIDER_SITE_OTHER): Payer: Medicare Other | Admitting: Cardiology

## 2015-02-23 VITALS — BP 125/76 | HR 76 | Ht 61.0 in | Wt 118.0 lb

## 2015-02-23 DIAGNOSIS — I10 Essential (primary) hypertension: Secondary | ICD-10-CM

## 2015-02-23 DIAGNOSIS — I2510 Atherosclerotic heart disease of native coronary artery without angina pectoris: Secondary | ICD-10-CM

## 2015-02-23 DIAGNOSIS — E785 Hyperlipidemia, unspecified: Secondary | ICD-10-CM

## 2015-02-23 DIAGNOSIS — I739 Peripheral vascular disease, unspecified: Secondary | ICD-10-CM

## 2015-02-23 NOTE — Progress Notes (Signed)
Patient ID: Kendra Walker, female   DOB: [birth year], 65 y.o.   MRN: [id number]     Clinical Summary Kendra Walker is a 65 y.o.female seen today for follow up of the following medical problems.   1. CAD - previously followed by [facility]. 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  - LVEF March 2011 reported normal LVEF. Echo 04/2011 LVEF 55-60%. - 12/2014 Lexiscan without ischemia - since last visit breathing somewhat improved. Reports deconditioning due to lack of exercise.  - compliant with meds  2. PAD - hx of right common femoral to popliteal bypass in [place] - she has not had regular vascular follow up since her surgery  - denies any claudication like pain. No foot sores  3. Hyperlipidemia - 07/2013 TC 212 TG 89 HDL 96 LDL 96 - compliant with statin  4. 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 Adrenal insufficiency - followed by endocrine  6.HTN - does not check regularly at home - compliant with meds  7. CKD - followed at [facility] Past Medical History  Diagnosis Date  . Diabetes mellitus without complication ([place])   . Hypothyroidism   . Hyperlipemia   . Hypertension   . Cancer (H[facility])     Squamous Cell     Allergies  Allergen Reactions  . Bactrim [Sulfamethoxazole-Trimethoprim] Other (See Comments)    Elevates potassium.   . Tape Other (See Comments)    not allergic but causes sensitivity.  . Fluorescein Rash  . Procrit [Epoetin (Alfa)] Rash     Current Outpatient Prescriptions  Medication Sig Dispense Refill  . acetaminophen (TYLENOL) 500 MG tablet Take 500 mg by mouth daily as needed for mild pain or headache.    . Ascorbic Acid (VITAMIN C PO) Take 1 tablet by mouth daily.    Marland Kitchen aspirin EC 81 MG tablet Take 81 mg by mouth daily.    Marland Kitchen atorvastatin (LIPITOR) 80 MG tablet Take 1 tablet (80 mg total) by mouth daily. 30 tablet 6  . cholecalciferol (VITAMIN D) 1000 UNITS tablet Take 1,000 Units by  mouth daily.    . fludrocortisone (FLORINEF) 0.1 MG tablet Take 0.5 tablets by mouth daily.    . insulin aspart (NOVOLOG) 100 UNIT/ML injection Inject into the skin as directed. Per insulin pump.    . Insulin Human (INSULIN PUMP) SOLN Inject into the skin.    Marland Kitchen iron polysaccharides (FERREX 150) 150 MG capsule Take 150 mg by mouth daily.    Marland Kitchen levothyroxine (SYNTHROID, LEVOTHROID) 100 MCG tablet Take 1 tablet by mouth daily.    . magnesium chloride (SLOW-MAG) 64 MG TBEC SR tablet Take 1 tablet by mouth 3 (three) times daily.     . Multiple Vitamin (MULTIVITAMIN WITH MINERALS) TABS tablet Take 1 tablet by mouth daily.    . Multiple Vitamins-Minerals (PRESERVISION AREDS 2 PO) Take 1 tablet by mouth daily.     . Omega-3 Fatty Acids (FISH OIL) 1000 MG CAPS Take 1 capsule by mouth daily.    . predniSONE (DELTASONE) 5 MG tablet Take 5 mg by mouth daily with breakfast.    . sodium polystyrene (KAYEXALATE) 15 GM/60ML suspension Take 15 g by mouth 2 (two) times a week.      No current facility-administered medications for this visit.     Past Surgical History  Procedure Laterality Date  . Tonsillectomy    . Appendectomy    . Coronary artery bypass graft    .  Cataract extraction Bilateral   . Yag laser application Right 0000000    Procedure: YAG LASER APPLICATION;  Surgeon: Williams Che, MD;  Location: [facility];  Service: Ophthalmology;  Laterality: Right;     Allergies  Allergen Reactions  . Bactrim [Sulfamethoxazole-Trimethoprim] Other (See Comments)    Elevates potassium.   . Tape Other (See Comments)    not allergic but causes sensitivity.  . Fluorescein Rash  . Procrit [Epoetin (Alfa)] Rash      No family history on file.   Social History Kendra Walker reports that she has never smoked. She has never used smokeless tobacco. Kendra Walker has no alcohol history on file.   Review of Systems CONSTITUTIONAL: No weight loss, fever, chills, weakness or fatigue.  HEENT: Eyes: No  visual loss, blurred vision, double vision or yellow sclerae.No hearing loss, sneezing, congestion, runny nose or sore throat.  SKIN: No rash or itching.  CARDIOVASCULAR: per HPI RESPIRATORY: No cough or sputum.  GASTROINTESTINAL: No anorexia, nausea, vomiting or diarrhea. No abdominal pain or blood.  GENITOURINARY: No burning on urination, no polyuria NEUROLOGICAL: No headache, dizziness, syncope, paralysis, ataxia, numbness or tingling in the extremities. No change in bowel or bladder control.  MUSCULOSKELETAL: No muscle, back pain, joint pain or stiffness.  LYMPHATICS: No enlarged nodes. No history of splenectomy.  PSYCHIATRIC: No history of depression or anxiety.  ENDOCRINOLOGIC: No reports of sweating, cold or heat intolerance. No polyuria or polydipsia.  Marland Kitchen   Physical Examination Filed Vitals:   02/23/15 1424  BP: 125/76  Pulse: 76   Filed Vitals:   02/23/15 1424  Height: 5\' 1"  (1.549 m)  Weight: 118 lb (53.524 kg)    Gen: resting comfortably, no acute distress HEENT: no scleral icterus, pupils equal round and reactive, no palptable cervical adenopathy,  CV: RRR, no m/r/g, no jvd Resp: Clear to auscultation bilaterally GI: abdomen is soft, non-tender, non-distended, normal bowel sounds, no hepatosplenomegaly MSK: extremities are warm, no edema.  Skin: warm, no rash Neuro:  no focal deficits Psych: appropriate affect   Diagnostic Studies 04/2011 Echo FINDINGS:  LEFT VENTRICLE The left ventricular size is normal. There is normal left ventricular wall  thickness. Left ventricular systolic function is normal. LV ejection fraction  = 55-60%. Left ventricular filling pattern is indeterminate. The left  ventricular wall motion is normal. LV WALL MOTION -  RIGHT VENTRICLE The right ventricle is normal in size and function. The right ventricular  systolic function is normal. The right ventricular wall motion is normal.  LEFT ATRIUM The left atrium is borderline  dilated.  RIGHT ATRIUM  Right atrial size is normal. - AORTIC VALVE The aortic valve is normal in structure and function. The aortic valve opens  well. Trace (trivial) aortic regurgitation. - MITRAL VALVE The mitral valve leaflets appear normal. There is no evidence of stenosis,  fluttering, or prolapse. There is mild mitral regurgitation. - TRICUSPID VALVE Structurally normal tricuspid valve. There is mild tricuspid regurgitation. - PULMONIC VALVE The pulmonic valve is normal in structure and function. Trace pulmonic  valvular regurgitation. - ARTERIES The aortic root is normal size. - VENOUS Pulmonary venous flow pattern is blunted. IVC size was normal. - EFFUSION There is no pericardial effusion. - - MMode/2D Measurements & Calculations IVSd: 0.95 cm LVIDd: 3.9 cm LVPWd: 0.99 cm LVIDs: 2.4 cm LA dim: 3.8 cm Ao root: 2.5 cm EDV(MOD-sp4): 24.0 ml ESV(MOD-sp4): 11.0 ml LVOT diam: 1.5 cm LAV(MOD-sp4): 41.5 ml Doppler Measurements & Calculations MV E max vel:  114.0 cm/sec MV A max vel: 82.4 cm/sec MV E/A: 1.4 Med Peak E' Vel: 7.6 cm/sec Lat Peak E' Vel: 12.2 cm/sec E/Lat E`: 9.4 E/Med E`: 15.0 MV dec time: 0.16 sec SV(LVOT): 45.4 ml Ao mean PG: 7.1 mmHg AVA (VTI): 1.3 cm2 LV V1 VTI: 27.2 cm TR max vel: 230.8 cm/sec TR max PG: 21.3 mmHg RVSP(TR): 26.3 mmHg RAP systole: 5.0 mmHg   11/2014 echo Study Conclusions  - Left ventricle: The cavity size was normal. Systolic function was normal. The estimated ejection fraction was in the range of 60% to 65%. Wall motion was normal; there were no regional wall motion abnormalities. Diastolic dysfunction, grade indeterminate. Medial annular velocity is slightly low with elevated E/e&' ratio of 18, indicative of elevated filling pressures. Mild to moderate concentric left ventricular hypertrophy. - Aortic valve: Moderately calcified annulus. Trileaflet. There was mild regurgitation. - Mitral  valve: Mildly calcified annulus. Mildly thickened leaflets . There was trivial regurgitation. - Right ventricle: Systolic function was normal. TAPSE: 17.6 mm . - Tricuspid valve: There was mild-moderate regurgitation. - Pulmonary arteries: Systolic pressure was mildly increased. PA peak pressure: 35 mm Hg (S).   12/2014 Lexiscan MPI  No diagnostic ST segment abnormalities. Rare PACs.  Very small fixed perfusion defect at the inferior apex most consistent with attenuation artifact rather than scar. No ischemic findings.  This is a low risk study.  Nuclear stress EF: 70%.    Assessment and Plan   1. CAD - recent lexiscan without ischemia - SOB is improving since last visit, probable deconditioning as the etiology. No evidence of significant cardiac pathology at this time.   2. PAD - no current claudication symptoms - we will refer her to vascular in [place] given her prior bypass in 2008, she has not had any follow up since that time.   3. Hyperlipidemia -continue high dose statin.  - request labs from pcp  4. HTN - at goal, not currently requiring bp meds. Due to adrenal insufficiency prior issues have been more related to hypotension       Arnoldo Lenis, M.D.

## 2015-02-23 NOTE — Patient Instructions (Signed)
Your physician wants you to follow-up in: [place] DR. BRANCH You will receive a reminder letter in the mail two months in advance. If you don't receive a letter, please call our office to schedule the follow-up appointment.  Your physician recommends that you continue on your current medications as directed. Please refer to the Current Medication list given to you today.  You have been referred to VASCULAR   Thank you for choosing [place]!!

## 2015-03-01 ENCOUNTER — Other Ambulatory Visit: Payer: Self-pay | Admitting: *Deleted

## 2015-03-01 DIAGNOSIS — E1121 Type 2 diabetes mellitus with diabetic nephropathy: Secondary | ICD-10-CM | POA: Diagnosis not present

## 2015-03-01 DIAGNOSIS — E279 Disorder of adrenal gland, unspecified: Secondary | ICD-10-CM | POA: Diagnosis not present

## 2015-03-01 DIAGNOSIS — E039 Hypothyroidism, unspecified: Secondary | ICD-10-CM | POA: Diagnosis not present

## 2015-03-01 DIAGNOSIS — I739 Peripheral vascular disease, unspecified: Secondary | ICD-10-CM

## 2015-03-01 DIAGNOSIS — E782 Mixed hyperlipidemia: Secondary | ICD-10-CM | POA: Diagnosis not present

## 2015-03-01 DIAGNOSIS — E1169 Type 2 diabetes mellitus with other specified complication: Secondary | ICD-10-CM | POA: Diagnosis not present

## 2015-03-14 ENCOUNTER — Other Ambulatory Visit: Payer: Self-pay

## 2015-03-14 DIAGNOSIS — I7092 Chronic total occlusion of artery of the extremities: Secondary | ICD-10-CM

## 2015-03-24 DIAGNOSIS — I739 Peripheral vascular disease, unspecified: Secondary | ICD-10-CM | POA: Diagnosis not present

## 2015-03-24 DIAGNOSIS — E1142 Type 2 diabetes mellitus with diabetic polyneuropathy: Secondary | ICD-10-CM | POA: Diagnosis not present

## 2015-03-24 DIAGNOSIS — M2041 Other hammer toe(s) (acquired), right foot: Secondary | ICD-10-CM | POA: Diagnosis not present

## 2015-03-24 DIAGNOSIS — M2042 Other hammer toe(s) (acquired), left foot: Secondary | ICD-10-CM | POA: Diagnosis not present

## 2015-03-24 DIAGNOSIS — B351 Tinea unguium: Secondary | ICD-10-CM | POA: Diagnosis not present

## 2015-04-25 ENCOUNTER — Encounter (HOSPITAL_COMMUNITY): Payer: Medicare Other

## 2015-04-25 ENCOUNTER — Other Ambulatory Visit (HOSPITAL_COMMUNITY): Payer: Medicare Other

## 2015-04-25 ENCOUNTER — Encounter: Payer: Medicare Other | Admitting: Surgery

## 2015-04-29 DIAGNOSIS — Z794 Long term (current) use of insulin: Secondary | ICD-10-CM | POA: Diagnosis not present

## 2015-04-29 DIAGNOSIS — E108 Type 1 diabetes mellitus with unspecified complications: Secondary | ICD-10-CM | POA: Diagnosis not present

## 2015-05-12 DIAGNOSIS — E271 Primary adrenocortical insufficiency: Secondary | ICD-10-CM | POA: Diagnosis not present

## 2015-05-12 DIAGNOSIS — E109 Type 1 diabetes mellitus without complications: Secondary | ICD-10-CM | POA: Diagnosis not present

## 2015-05-12 DIAGNOSIS — Z9641 Presence of insulin pump (external) (internal): Secondary | ICD-10-CM | POA: Diagnosis not present

## 2015-05-12 DIAGNOSIS — E2740 Unspecified adrenocortical insufficiency: Secondary | ICD-10-CM | POA: Diagnosis not present

## 2015-05-12 DIAGNOSIS — Z7952 Long term (current) use of systemic steroids: Secondary | ICD-10-CM | POA: Diagnosis not present

## 2015-05-12 DIAGNOSIS — Z794 Long term (current) use of insulin: Secondary | ICD-10-CM | POA: Diagnosis not present

## 2015-05-12 DIAGNOSIS — N189 Chronic kidney disease, unspecified: Secondary | ICD-10-CM | POA: Diagnosis not present

## 2015-05-12 DIAGNOSIS — E1022 Type 1 diabetes mellitus with diabetic chronic kidney disease: Secondary | ICD-10-CM | POA: Diagnosis not present

## 2015-05-12 DIAGNOSIS — E1065 Type 1 diabetes mellitus with hyperglycemia: Secondary | ICD-10-CM | POA: Diagnosis not present

## 2015-06-16 DIAGNOSIS — E10319 Type 1 diabetes mellitus with unspecified diabetic retinopathy without macular edema: Secondary | ICD-10-CM | POA: Diagnosis not present

## 2015-06-16 DIAGNOSIS — I252 Old myocardial infarction: Secondary | ICD-10-CM | POA: Diagnosis not present

## 2015-06-16 DIAGNOSIS — N183 Chronic kidney disease, stage 3 (moderate): Secondary | ICD-10-CM | POA: Diagnosis not present

## 2015-06-16 DIAGNOSIS — E271 Primary adrenocortical insufficiency: Secondary | ICD-10-CM | POA: Diagnosis not present

## 2015-06-16 DIAGNOSIS — R809 Proteinuria, unspecified: Secondary | ICD-10-CM | POA: Diagnosis not present

## 2015-06-16 DIAGNOSIS — Z7952 Long term (current) use of systemic steroids: Secondary | ICD-10-CM | POA: Diagnosis not present

## 2015-06-16 DIAGNOSIS — Z794 Long term (current) use of insulin: Secondary | ICD-10-CM | POA: Diagnosis not present

## 2015-06-16 DIAGNOSIS — D649 Anemia, unspecified: Secondary | ICD-10-CM | POA: Diagnosis not present

## 2015-06-16 DIAGNOSIS — E2740 Unspecified adrenocortical insufficiency: Secondary | ICD-10-CM | POA: Diagnosis not present

## 2015-06-16 DIAGNOSIS — I129 Hypertensive chronic kidney disease with stage 1 through stage 4 chronic kidney disease, or unspecified chronic kidney disease: Secondary | ICD-10-CM | POA: Diagnosis not present

## 2015-06-16 DIAGNOSIS — E1022 Type 1 diabetes mellitus with diabetic chronic kidney disease: Secondary | ICD-10-CM | POA: Diagnosis not present

## 2015-06-16 DIAGNOSIS — E1042 Type 1 diabetes mellitus with diabetic polyneuropathy: Secondary | ICD-10-CM | POA: Diagnosis not present

## 2015-06-16 DIAGNOSIS — Z79899 Other long term (current) drug therapy: Secondary | ICD-10-CM | POA: Diagnosis not present

## 2015-06-16 DIAGNOSIS — E785 Hyperlipidemia, unspecified: Secondary | ICD-10-CM | POA: Diagnosis not present

## 2015-06-16 DIAGNOSIS — E875 Hyperkalemia: Secondary | ICD-10-CM | POA: Diagnosis not present

## 2015-06-16 DIAGNOSIS — Z951 Presence of aortocoronary bypass graft: Secondary | ICD-10-CM | POA: Diagnosis not present

## 2015-07-07 DIAGNOSIS — M2041 Other hammer toe(s) (acquired), right foot: Secondary | ICD-10-CM | POA: Diagnosis not present

## 2015-07-07 DIAGNOSIS — E1142 Type 2 diabetes mellitus with diabetic polyneuropathy: Secondary | ICD-10-CM | POA: Diagnosis not present

## 2015-07-07 DIAGNOSIS — B351 Tinea unguium: Secondary | ICD-10-CM | POA: Diagnosis not present

## 2015-07-07 DIAGNOSIS — M2042 Other hammer toe(s) (acquired), left foot: Secondary | ICD-10-CM | POA: Diagnosis not present

## 2015-07-07 DIAGNOSIS — I739 Peripheral vascular disease, unspecified: Secondary | ICD-10-CM | POA: Diagnosis not present

## 2015-07-11 DIAGNOSIS — Z1231 Encounter for screening mammogram for malignant neoplasm of breast: Secondary | ICD-10-CM | POA: Diagnosis not present

## 2015-07-27 DIAGNOSIS — E109 Type 1 diabetes mellitus without complications: Secondary | ICD-10-CM | POA: Diagnosis not present

## 2015-08-10 DIAGNOSIS — Z794 Long term (current) use of insulin: Secondary | ICD-10-CM | POA: Diagnosis not present

## 2015-08-10 DIAGNOSIS — E108 Type 1 diabetes mellitus with unspecified complications: Secondary | ICD-10-CM | POA: Diagnosis not present

## 2015-08-23 ENCOUNTER — Ambulatory Visit (INDEPENDENT_AMBULATORY_CARE_PROVIDER_SITE_OTHER): Payer: Medicare Other | Admitting: Cardiology

## 2015-08-23 ENCOUNTER — Encounter: Payer: Self-pay | Admitting: Cardiology

## 2015-08-23 VITALS — BP 110/66 | HR 69 | Ht 61.0 in | Wt 118.0 lb

## 2015-08-23 DIAGNOSIS — I2510 Atherosclerotic heart disease of native coronary artery without angina pectoris: Secondary | ICD-10-CM

## 2015-08-23 DIAGNOSIS — E785 Hyperlipidemia, unspecified: Secondary | ICD-10-CM

## 2015-08-23 DIAGNOSIS — I10 Essential (primary) hypertension: Secondary | ICD-10-CM

## 2015-08-23 DIAGNOSIS — I739 Peripheral vascular disease, unspecified: Secondary | ICD-10-CM | POA: Diagnosis not present

## 2015-08-23 NOTE — Progress Notes (Signed)
Clinical Summary Ms. Bota is a 65 y.o.female seen today for follow up of the following medical problems.   1. CAD - previously followed by [facility]. 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  - LVEF March 2011 reported normal LVEF. Echo 04/2011 LVEF 55-60%. - 12/2014 Lexiscan without ischemia - breathing overall stable since last visit. Fairly sedentary lifestyle due to an old ankle injru. No recent chest pain - compliant with meds  2. PAD - hx of right common femoral to popliteal bypass in [place] - denies any claudication like pain. No foot sores. Has upcoming appt with vascular.   3. Hyperlipidemia - 02/2015 TC 148 TG 38 HDL 104 LDL 36 - compliant with statin  4. 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 Adrenal insufficiency - followed by endocrine  6.HTN - does not check regularly at home -she is compliant with meds  7. CKD - followed at [facility] Past Medical History:  Diagnosis Date  . Cancer (HCC)    Squamous Cell  . Diabetes mellitus without complication ([place])   . Hyperlipemia   . Hypertension   . Hypothyroidism      Allergies  Allergen Reactions  . Bactrim [Sulfamethoxazole-Trimethoprim] Other (See Comments)    Elevates potassium.   . Tape Other (See Comments)    not allergic but causes sensitivity.  . Fluorescein Rash  . Procrit [Epoetin (Alfa)] Rash     Current Outpatient Prescriptions  Medication Sig Dispense Refill  . acetaminophen (TYLENOL) 500 MG tablet Take 500 mg by mouth daily as needed for mild pain or headache.    . Ascorbic Acid (VITAMIN C PO) Take 1 tablet by mouth daily.    Marland Kitchen aspirin EC 81 MG tablet Take 81 mg by mouth daily.    Marland Kitchen atorvastatin (LIPITOR) 80 MG tablet Take 1 tablet (80 mg total) by mouth daily. 30 tablet 6  . cholecalciferol (VITAMIN D) 1000 UNITS tablet Take 1,000 Units by mouth daily.    . fludrocortisone (FLORINEF) 0.1 MG tablet Take 0.5  tablets by mouth daily.    . insulin aspart (NOVOLOG) 100 UNIT/ML injection Inject into the skin as directed. Per insulin pump.    . iron polysaccharides (FERREX 150) 150 MG capsule Take 150 mg by mouth daily.    Marland Kitchen levothyroxine (SYNTHROID, LEVOTHROID) 100 MCG tablet Take 1 tablet by mouth daily.    . magnesium chloride (SLOW-MAG) 64 MG TBEC SR tablet Take 1 tablet by mouth 3 (three) times daily.     . Multiple Vitamin (MULTIVITAMIN WITH MINERALS) TABS tablet Take 1 tablet by mouth daily.    . Multiple Vitamins-Minerals (PRESERVISION AREDS 2 PO) Take 1 tablet by mouth daily.     . Omega-3 Fatty Acids (FISH OIL) 1000 MG CAPS Take 1 capsule by mouth daily.    . predniSONE (DELTASONE) 5 MG tablet Take 5 mg by mouth daily with breakfast.    . sodium polystyrene (KAYEXALATE) 15 GM/60ML suspension Take 15 g by mouth 2 (two) times a week.      No current facility-administered medications for this visit.      Past Surgical History:  Procedure Laterality Date  . APPENDECTOMY    . CATARACT EXTRACTION Bilateral   . CORONARY ARTERY BYPASS GRAFT    . TONSILLECTOMY    . YAG LASER APPLICATION Right 0000000   Procedure: YAG LASER APPLICATION;  Surgeon: Williams Che, MD;  Location: [facility];  Service: Ophthalmology;  Laterality: Right;     Allergies  Allergen Reactions  . Bactrim [Sulfamethoxazole-Trimethoprim] Other (See Comments)    Elevates potassium.   . Tape Other (See Comments)    not allergic but causes sensitivity.  . Fluorescein Rash  . Procrit [Epoetin (Alfa)] Rash      No family history on file.   Social History Ms. Reeb reports that she has never smoked. She has never used smokeless tobacco. Ms. Mcglocklin has no alcohol history on file.   Review of Systems CONSTITUTIONAL: No weight loss, fever, chills, weakness or fatigue.  HEENT: Eyes: No visual loss, blurred vision, double vision or yellow sclerae.No hearing loss, sneezing, congestion, runny nose or sore throat.    SKIN: No rash or itching.  CARDIOVASCULAR: per HPI RESPIRATORY: No shortness of breath, cough or sputum.  GASTROINTESTINAL: No anorexia, nausea, vomiting or diarrhea. No abdominal pain or blood.  GENITOURINARY: No burning on urination, no polyuria NEUROLOGICAL: No headache, dizziness, syncope, paralysis, ataxia, numbness or tingling in the extremities. No change in bowel or bladder control.  MUSCULOSKELETAL: No muscle, back pain, joint pain or stiffness.  LYMPHATICS: No enlarged nodes. No history of splenectomy.  PSYCHIATRIC: No history of depression or anxiety.  ENDOCRINOLOGIC: No reports of sweating, cold or heat intolerance. No polyuria or polydipsia.  Marland Kitchen   Physical Examination Vitals:   08/23/15 1043  BP: 110/66  Pulse: 69   Vitals:   08/23/15 1043  Weight: 118 lb (53.5 kg)  Height: 5\' 1"  (1.549 m)    Gen: resting comfortably, no acute distress HEENT: no scleral icterus, pupils equal round and reactive, no palptable cervical adenopathy,  CV: RRR, no m/r/g, no jvd Resp: Clear to auscultation bilaterally GI: abdomen is soft, non-tender, non-distended, normal bowel sounds, no hepatosplenomegaly MSK: extremities are warm, no edema.  Skin: warm, no rash Neuro:  no focal deficits Psych: appropriate affect   Diagnostic Studies 04/2011 Echo FINDINGS:  LEFT VENTRICLE The left ventricular size is normal. There is normal left ventricular wall  thickness. Left ventricular systolic function is normal. LV ejection fraction  = 55-60%. Left ventricular filling pattern is indeterminate. The left  ventricular wall motion is normal. LV WALL MOTION -  RIGHT VENTRICLE The right ventricle is normal in size and function. The right ventricular  systolic function is normal. The right ventricular wall motion is normal.  LEFT ATRIUM The left atrium is borderline dilated.  RIGHT ATRIUM  Right atrial size is normal. - AORTIC VALVE The aortic valve is normal in structure and  function. The aortic valve opens  well. Trace (trivial) aortic regurgitation. - MITRAL VALVE The mitral valve leaflets appear normal. There is no evidence of stenosis,  fluttering, or prolapse. There is mild mitral regurgitation. - TRICUSPID VALVE Structurally normal tricuspid valve. There is mild tricuspid regurgitation. - PULMONIC VALVE The pulmonic valve is normal in structure and function. Trace pulmonic  valvular regurgitation. - ARTERIES The aortic root is normal size. - VENOUS Pulmonary venous flow pattern is blunted. IVC size was normal. - EFFUSION There is no pericardial effusion. - - MMode/2D Measurements & Calculations IVSd: 0.95 cm LVIDd: 3.9 cm LVPWd: 0.99 cm LVIDs: 2.4 cm LA dim: 3.8 cm Ao root: 2.5 cm EDV(MOD-sp4): 24.0 ml ESV(MOD-sp4): 11.0 ml LVOT diam: 1.5 cm LAV(MOD-sp4): 41.5 ml Doppler Measurements & Calculations MV E max vel: 114.0 cm/sec MV A max vel: 82.4 cm/sec MV E/A: 1.4 Med Peak E' Vel: 7.6 cm/sec Lat Peak E' Vel: 12.2 cm/sec E/Lat E`: 9.4 E/Med  E`: 15.0 MV dec time: 0.16 sec SV(LVOT): 45.4 ml Ao mean PG: 7.1 mmHg AVA (VTI): 1.3 cm2 LV V1 VTI: 27.2 cm TR max vel: 230.8 cm/sec TR max PG: 21.3 mmHg RVSP(TR): 26.3 mmHg RAP systole: 5.0 mmHg   11/2014 echo Study Conclusions  - Left ventricle: The cavity size was normal. Systolic function was normal. The estimated ejection fraction was in the range of 60% to 65%. Wall motion was normal; there were no regional wall motion abnormalities. Diastolic dysfunction, grade indeterminate. Medial annular velocity is slightly low with elevated E/e&' ratio of 18, indicative of elevated filling pressures. Mild to moderate concentric left ventricular hypertrophy. - Aortic valve: Moderately calcified annulus. Trileaflet. There was mild regurgitation. - Mitral valve: Mildly calcified annulus. Mildly thickened leaflets . There was trivial regurgitation. - Right ventricle:  Systolic function was normal. TAPSE: 17.6 mm . - Tricuspid valve: There was mild-moderate regurgitation. - Pulmonary arteries: Systolic pressure was mildly increased. PA peak pressure: 35 mm Hg (S).   12/2014 Lexiscan MPI  No diagnostic ST segment abnormalities. Rare PACs.  Very small fixed perfusion defect at the inferior apex most consistent with attenuation artifact rather than scar. No ischemic findings.  This is a low risk study.  Nuclear stress EF: 70%.       Assessment and Plan  1. CAD - recent lexiscan without ischemia - no recent chest pain, stable chronic SOB likely related to deconditioning - continue current meds  2. PAD - no claudication symptoms - has f/u with vascular coming up  3. Hyperlipidemia -continue high dose statin.    4. HTN - at goal, not currently requiring bp meds. Due to adrenal insufficiency prior issues have been more related to hypotension    F/u 1 year  Arnoldo Lenis, M.D.

## 2015-08-23 NOTE — Patient Instructions (Signed)
Medication Instructions:  Continue all current medications.  Labwork: None.  Testing/Procedures: None.  Follow-Up: Your physician wants you to follow up in:  1 year.  You will receive a reminder letter in the mail one-two months in advance.  If you don't receive a letter, please call our office to schedule the follow up appointment   Any Other Special Instructions Will Be Listed Below (If Applicable).  If you need a refill on your cardiac medications before your next appointment, please call your pharmacy.

## 2015-08-29 DIAGNOSIS — E782 Mixed hyperlipidemia: Secondary | ICD-10-CM | POA: Diagnosis not present

## 2015-08-29 DIAGNOSIS — I10 Essential (primary) hypertension: Secondary | ICD-10-CM | POA: Diagnosis not present

## 2015-08-29 DIAGNOSIS — E1169 Type 2 diabetes mellitus with other specified complication: Secondary | ICD-10-CM | POA: Diagnosis not present

## 2015-09-01 DIAGNOSIS — E1121 Type 2 diabetes mellitus with diabetic nephropathy: Secondary | ICD-10-CM | POA: Diagnosis not present

## 2015-09-01 DIAGNOSIS — E782 Mixed hyperlipidemia: Secondary | ICD-10-CM | POA: Diagnosis not present

## 2015-09-01 DIAGNOSIS — E1169 Type 2 diabetes mellitus with other specified complication: Secondary | ICD-10-CM | POA: Diagnosis not present

## 2015-09-01 DIAGNOSIS — E279 Disorder of adrenal gland, unspecified: Secondary | ICD-10-CM | POA: Diagnosis not present

## 2015-09-01 DIAGNOSIS — H6123 Impacted cerumen, bilateral: Secondary | ICD-10-CM | POA: Diagnosis not present

## 2015-09-01 DIAGNOSIS — E039 Hypothyroidism, unspecified: Secondary | ICD-10-CM | POA: Diagnosis not present

## 2015-09-12 DIAGNOSIS — E1065 Type 1 diabetes mellitus with hyperglycemia: Secondary | ICD-10-CM | POA: Diagnosis not present

## 2015-09-12 DIAGNOSIS — E10649 Type 1 diabetes mellitus with hypoglycemia without coma: Secondary | ICD-10-CM | POA: Diagnosis not present

## 2015-09-12 DIAGNOSIS — E271 Primary adrenocortical insufficiency: Secondary | ICD-10-CM | POA: Diagnosis not present

## 2015-09-12 DIAGNOSIS — N183 Chronic kidney disease, stage 3 (moderate): Secondary | ICD-10-CM | POA: Diagnosis not present

## 2015-09-12 DIAGNOSIS — E039 Hypothyroidism, unspecified: Secondary | ICD-10-CM | POA: Diagnosis not present

## 2015-09-12 DIAGNOSIS — Z794 Long term (current) use of insulin: Secondary | ICD-10-CM | POA: Diagnosis not present

## 2015-09-12 DIAGNOSIS — Z7982 Long term (current) use of aspirin: Secondary | ICD-10-CM | POA: Diagnosis not present

## 2015-09-12 DIAGNOSIS — Z9641 Presence of insulin pump (external) (internal): Secondary | ICD-10-CM | POA: Diagnosis not present

## 2015-09-20 DIAGNOSIS — H35373 Puckering of macula, bilateral: Secondary | ICD-10-CM | POA: Diagnosis not present

## 2015-09-20 DIAGNOSIS — H353222 Exudative age-related macular degeneration, left eye, with inactive choroidal neovascularization: Secondary | ICD-10-CM | POA: Diagnosis not present

## 2015-09-20 DIAGNOSIS — E113593 Type 2 diabetes mellitus with proliferative diabetic retinopathy without macular edema, bilateral: Secondary | ICD-10-CM | POA: Diagnosis not present

## 2015-09-20 DIAGNOSIS — H353133 Nonexudative age-related macular degeneration, bilateral, advanced atrophic without subfoveal involvement: Secondary | ICD-10-CM | POA: Diagnosis not present

## 2015-09-27 DIAGNOSIS — E10649 Type 1 diabetes mellitus with hypoglycemia without coma: Secondary | ICD-10-CM | POA: Diagnosis not present

## 2015-10-25 DIAGNOSIS — Z23 Encounter for immunization: Secondary | ICD-10-CM | POA: Diagnosis not present

## 2015-11-23 ENCOUNTER — Encounter: Payer: Self-pay | Admitting: Surgery

## 2015-11-28 ENCOUNTER — Ambulatory Visit (HOSPITAL_COMMUNITY)
Admission: RE | Admit: 2015-11-28 | Discharge: 2015-11-28 | Disposition: A | Discharge status: Home / Self Care | Payer: Medicare Other | Source: Ambulatory Visit | Attending: Surgery | Admitting: Surgery

## 2015-11-28 ENCOUNTER — Ambulatory Visit (INDEPENDENT_AMBULATORY_CARE_PROVIDER_SITE_OTHER): Payer: Medicare Other | Admitting: Surgery

## 2015-11-28 ENCOUNTER — Ambulatory Visit (INDEPENDENT_AMBULATORY_CARE_PROVIDER_SITE_OTHER)
Admission: RE | Admit: 2015-11-28 | Discharge: 2015-11-28 | Disposition: A | Discharge status: Home / Self Care | Payer: Medicare Other | Source: Ambulatory Visit | Attending: Surgery | Admitting: Surgery

## 2015-11-28 ENCOUNTER — Encounter: Payer: Self-pay | Admitting: Surgery

## 2015-11-28 VITALS — BP 134/64 | HR 63 | Temp 98.0°F | Resp 16 | Ht 62.0 in | Wt 123.0 lb

## 2015-11-28 DIAGNOSIS — I7092 Chronic total occlusion of artery of the extremities: Secondary | ICD-10-CM | POA: Insufficient documentation

## 2015-11-28 DIAGNOSIS — I70211 Atherosclerosis of native arteries of extremities with intermittent claudication, right leg: Secondary | ICD-10-CM

## 2015-11-28 DIAGNOSIS — R0989 Other specified symptoms and signs involving the circulatory and respiratory systems: Secondary | ICD-10-CM | POA: Diagnosis present

## 2015-11-28 NOTE — Progress Notes (Signed)
[facility] [place]  Patient name: Kendra Walker MRN: [id number] DOB: Sep 25, 1950 Sex: female  REFERRING PHYSICIAN: Dr. Harl Bowie  REASON FOR CONSULT: establish vascular care  HPI: Kendra Walker is a 65 y.o. female, who is referred today for reestablishing vascular care.  The patient has a history of a right femoral to above-knee popliteal artery bypass graft with vein in 2008 in [place].  This was done for a nonhealing wound.  The patient had a right ankle fracture with orthopedic hardware and became infected.  Fortunately she was able to have all of this heal.  She has not had any vascular follow-up since her surgery.  She does state that she gets cramping in her legs with walking approximately one half of an acre.  She denies rest pain or open wounds.  The patient suffers from coronary artery disease.  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  She has a Lexi-scan from 2016 without ischemia.  She suffers from hypercholesterolemia which is treated with a statin.  She has a history of chronic renal insufficiency.  Baseline creatinine is in the high 1 and a low 2 range.  She suffers from diabetes on an insulin pump.  She has had some resistance from the injection site and has switched to the contralateral side.  She also suffers from Addison's disease.  Past Medical History:  Diagnosis Date  . Cancer (HCC)    Squamous Cell  . Diabetes mellitus without complication ([place])   . Hyperlipemia   . Hypertension   . Hypothyroidism     No family history on file.  SOCIAL HISTORY: Social History   Social History  . Marital status: Married    Spouse name: Kendra Walker  . Number of children: Kendra Walker  . Years of education: Kendra Walker   Occupational History  . Not on file.   Social History Main Topics  . Smoking status: Never Smoker  . Smokeless tobacco: Never Used  . Alcohol use Not on file  . Drug use: Unknown  . Sexual activity: Not on file    Other Topics Concern  . Not on file   Social History Narrative  . No narrative on file    Allergies  Allergen Reactions  . Bactrim [Sulfamethoxazole-Trimethoprim] Other (See Comments)    Elevates potassium.   . Tape Other (See Comments)    not allergic but causes sensitivity.  . Fluorescein Rash  . Procrit [Epoetin (Alfa)] Rash    Current Outpatient Prescriptions  Medication Sig Dispense Refill  . acetaminophen (TYLENOL) 500 MG tablet Take 500 mg by mouth daily as needed for mild pain or headache.    . Ascorbic Acid (VITAMIN C PO) Take 1 tablet by mouth daily.    Marland Kitchen aspirin EC 81 MG tablet Take 81 mg by mouth daily.    Marland Kitchen atorvastatin (LIPITOR) 80 MG tablet Take 1 tablet (80 mg total) by mouth daily. 30 tablet 6  . cholecalciferol (VITAMIN D) 1000 UNITS tablet Take 1,000 Units by mouth daily.    . fludrocortisone (FLORINEF) 0.1 MG tablet Take 0.5 tablets by mouth daily.    Marland Kitchen HUMALOG 100 UNIT/ML injection as directed.    . iron polysaccharides (FERREX 150) 150 MG capsule Take 150 mg by mouth daily.    Marland Kitchen levothyroxine (SYNTHROID, LEVOTHROID) 100 MCG tablet Take 1 tablet by mouth daily.    . magnesium chloride (SLOW-MAG) 64 MG TBEC SR tablet Take 1 tablet by mouth 3 (three) times daily.     Marland Kitchen  Multiple Vitamin (MULTIVITAMIN WITH MINERALS) TABS tablet Take 1 tablet by mouth daily.    . Multiple Vitamins-Minerals (PRESERVISION AREDS 2 PO) Take 1 tablet by mouth daily.     . Omega-3 Fatty Acids (FISH OIL) 1000 MG CAPS Take 1 capsule by mouth daily.    . predniSONE (DELTASONE) 5 MG tablet Take 5 mg by mouth daily with breakfast.    . sodium polystyrene (KAYEXALATE) 15 GM/60ML suspension Take 15 g by mouth 2 (two) times a week.      No current facility-administered medications for this visit.     REVIEW OF SYSTEMS:  [X]  denotes positive finding, [ ]  denotes negative finding Cardiac  Comments:  Chest pain or chest pressure:    Shortness of breath upon exertion: x   Short of breath  when lying flat:    Irregular heart rhythm: x       Vascular    Pain in calf, thigh, or hip brought on by ambulation: x   Pain in feet at night that wakes you up from your sleep:     Blood clot in your veins:    Leg swelling:         Pulmonary    Oxygen at home:    Productive cough:     Wheezing:         Neurologic    Sudden weakness in arms or legs:     Sudden numbness in arms or legs:     Sudden onset of difficulty speaking or slurred speech:    Temporary loss of vision in one eye:     Problems with dizziness:         Gastrointestinal    Blood in stool:     Vomited blood:         Genitourinary    Burning when urinating:     Blood in urine:        Psychiatric    Major depression:         Hematologic    Bleeding problems:    Problems with blood clotting too easily:        Skin    Rashes or ulcers:        Constitutional    Fever or chills:      PHYSICAL EXAM: Vitals:   11/28/15 1105  BP: 134/64  Pulse: 63  Resp: 16  Temp: 98 F (36.7 C)  TempSrc: Oral  SpO2: 97%  Weight: 123 lb (55.8 kg)  Height: 5\' 2"  (1.575 m)    GENERAL: The patient is a well-nourished female, in no acute distress. The vital signs are documented above. CARDIAC: There is a regular rate and rhythm.  VASCULAR: palpable left dorsalis pedis pulse, nonpalpable right. PULMONARY: There is good air exchange bilaterally without wheezing or rales. ABDOMEN: Soft and non-tender with normal pitched bowel sounds. No pulsatile mass MUSCULOSKELETAL: There are no major deformities or cyanosis. NEUROLOGIC: No focal weakness or paresthesias are detected. SKIN: There are no ulcers or rashes noted. PSYCHIATRIC: The patient has a normal affect.  DATA:  I have reviewed her vascular lab study today.  There is a widely patent right lower extremity bypass graft with no evidence of stenosis.  ABIs 1.08 on the right and 1.14 on the left.  ASSESSMENT AND PLAN: Status post right femoral-popliteal bypass  graft for infection/ulcer in 2008 in [place]: Her bypass graft is widely patent.  She is compliant with her medications.  I have her scheduled for ultrasound surveillance with follow-up  in one year   Annamarie Major, MD [facility] [phone] Pager [phone]

## 2015-12-14 NOTE — Addendum Note (Signed)
Addended by: Lianne Cure A on: 12/14/2015 08:57 AM   Modules accepted: Orders

## 2015-12-19 DIAGNOSIS — Z794 Long term (current) use of insulin: Secondary | ICD-10-CM | POA: Diagnosis not present

## 2015-12-19 DIAGNOSIS — G8929 Other chronic pain: Secondary | ICD-10-CM | POA: Diagnosis not present

## 2015-12-19 DIAGNOSIS — E10319 Type 1 diabetes mellitus with unspecified diabetic retinopathy without macular edema: Secondary | ICD-10-CM | POA: Diagnosis not present

## 2015-12-19 DIAGNOSIS — E785 Hyperlipidemia, unspecified: Secondary | ICD-10-CM | POA: Diagnosis not present

## 2015-12-19 DIAGNOSIS — E271 Primary adrenocortical insufficiency: Secondary | ICD-10-CM | POA: Diagnosis not present

## 2015-12-19 DIAGNOSIS — I2510 Atherosclerotic heart disease of native coronary artery without angina pectoris: Secondary | ICD-10-CM | POA: Diagnosis not present

## 2015-12-19 DIAGNOSIS — E1051 Type 1 diabetes mellitus with diabetic peripheral angiopathy without gangrene: Secondary | ICD-10-CM | POA: Diagnosis not present

## 2015-12-19 DIAGNOSIS — E1022 Type 1 diabetes mellitus with diabetic chronic kidney disease: Secondary | ICD-10-CM | POA: Diagnosis not present

## 2015-12-19 DIAGNOSIS — N183 Chronic kidney disease, stage 3 (moderate): Secondary | ICD-10-CM | POA: Diagnosis not present

## 2015-12-19 DIAGNOSIS — Z9641 Presence of insulin pump (external) (internal): Secondary | ICD-10-CM | POA: Diagnosis not present

## 2015-12-19 DIAGNOSIS — Z951 Presence of aortocoronary bypass graft: Secondary | ICD-10-CM | POA: Diagnosis not present

## 2015-12-19 DIAGNOSIS — E039 Hypothyroidism, unspecified: Secondary | ICD-10-CM | POA: Diagnosis not present

## 2015-12-19 DIAGNOSIS — Z9889 Other specified postprocedural states: Secondary | ICD-10-CM | POA: Diagnosis not present

## 2015-12-19 DIAGNOSIS — Z7982 Long term (current) use of aspirin: Secondary | ICD-10-CM | POA: Diagnosis not present

## 2015-12-19 DIAGNOSIS — E1065 Type 1 diabetes mellitus with hyperglycemia: Secondary | ICD-10-CM | POA: Diagnosis not present

## 2016-02-01 DIAGNOSIS — Z4681 Encounter for fitting and adjustment of insulin pump: Secondary | ICD-10-CM | POA: Diagnosis not present

## 2016-02-01 DIAGNOSIS — E109 Type 1 diabetes mellitus without complications: Secondary | ICD-10-CM | POA: Diagnosis not present

## 2016-02-27 DIAGNOSIS — E272 Addisonian crisis: Secondary | ICD-10-CM | POA: Diagnosis not present

## 2016-02-27 DIAGNOSIS — R112 Nausea with vomiting, unspecified: Secondary | ICD-10-CM | POA: Diagnosis not present

## 2016-02-27 DIAGNOSIS — R197 Diarrhea, unspecified: Secondary | ICD-10-CM | POA: Diagnosis not present

## 2016-02-27 DIAGNOSIS — E271 Primary adrenocortical insufficiency: Secondary | ICD-10-CM | POA: Diagnosis not present

## 2016-02-27 DIAGNOSIS — J189 Pneumonia, unspecified organism: Secondary | ICD-10-CM | POA: Diagnosis not present

## 2016-02-27 DIAGNOSIS — R531 Weakness: Secondary | ICD-10-CM | POA: Diagnosis not present

## 2016-02-27 DIAGNOSIS — A419 Sepsis, unspecified organism: Secondary | ICD-10-CM | POA: Diagnosis not present

## 2016-02-27 DIAGNOSIS — R6521 Severe sepsis with septic shock: Secondary | ICD-10-CM | POA: Diagnosis not present

## 2016-02-27 DIAGNOSIS — N390 Urinary tract infection, site not specified: Secondary | ICD-10-CM | POA: Diagnosis not present

## 2016-02-27 DIAGNOSIS — R404 Transient alteration of awareness: Secondary | ICD-10-CM | POA: Diagnosis not present

## 2016-02-27 DIAGNOSIS — R509 Fever, unspecified: Secondary | ICD-10-CM | POA: Diagnosis not present

## 2016-02-28 DIAGNOSIS — E875 Hyperkalemia: Secondary | ICD-10-CM | POA: Diagnosis present

## 2016-02-28 DIAGNOSIS — Z79899 Other long term (current) drug therapy: Secondary | ICD-10-CM | POA: Diagnosis not present

## 2016-02-28 DIAGNOSIS — J189 Pneumonia, unspecified organism: Secondary | ICD-10-CM | POA: Diagnosis not present

## 2016-02-28 DIAGNOSIS — I110 Hypertensive heart disease with heart failure: Secondary | ICD-10-CM | POA: Diagnosis present

## 2016-02-28 DIAGNOSIS — R112 Nausea with vomiting, unspecified: Secondary | ICD-10-CM | POA: Diagnosis not present

## 2016-02-28 DIAGNOSIS — E272 Addisonian crisis: Secondary | ICD-10-CM | POA: Diagnosis present

## 2016-02-28 DIAGNOSIS — N390 Urinary tract infection, site not specified: Secondary | ICD-10-CM | POA: Diagnosis present

## 2016-02-28 DIAGNOSIS — E1051 Type 1 diabetes mellitus with diabetic peripheral angiopathy without gangrene: Secondary | ICD-10-CM | POA: Diagnosis present

## 2016-02-28 DIAGNOSIS — R197 Diarrhea, unspecified: Secondary | ICD-10-CM | POA: Diagnosis not present

## 2016-02-28 DIAGNOSIS — H3530 Unspecified macular degeneration: Secondary | ICD-10-CM | POA: Diagnosis present

## 2016-02-28 DIAGNOSIS — R6521 Severe sepsis with septic shock: Secondary | ICD-10-CM | POA: Diagnosis not present

## 2016-02-28 DIAGNOSIS — I509 Heart failure, unspecified: Secondary | ICD-10-CM | POA: Diagnosis present

## 2016-02-28 DIAGNOSIS — Z9641 Presence of insulin pump (external) (internal): Secondary | ICD-10-CM | POA: Diagnosis present

## 2016-02-28 DIAGNOSIS — N189 Chronic kidney disease, unspecified: Secondary | ICD-10-CM | POA: Diagnosis present

## 2016-02-28 DIAGNOSIS — Z91040 Latex allergy status: Secondary | ICD-10-CM | POA: Diagnosis not present

## 2016-02-28 DIAGNOSIS — I130 Hypertensive heart and chronic kidney disease with heart failure and stage 1 through stage 4 chronic kidney disease, or unspecified chronic kidney disease: Secondary | ICD-10-CM | POA: Diagnosis present

## 2016-02-28 DIAGNOSIS — I959 Hypotension, unspecified: Secondary | ICD-10-CM | POA: Diagnosis present

## 2016-02-28 DIAGNOSIS — Z881 Allergy status to other antibiotic agents status: Secondary | ICD-10-CM | POA: Diagnosis not present

## 2016-02-28 DIAGNOSIS — R918 Other nonspecific abnormal finding of lung field: Secondary | ICD-10-CM | POA: Diagnosis not present

## 2016-02-28 DIAGNOSIS — Z888 Allergy status to other drugs, medicaments and biological substances status: Secondary | ICD-10-CM | POA: Diagnosis not present

## 2016-02-28 DIAGNOSIS — E1022 Type 1 diabetes mellitus with diabetic chronic kidney disease: Secondary | ICD-10-CM | POA: Diagnosis present

## 2016-02-28 DIAGNOSIS — E271 Primary adrenocortical insufficiency: Secondary | ICD-10-CM | POA: Diagnosis present

## 2016-02-28 DIAGNOSIS — E039 Hypothyroidism, unspecified: Secondary | ICD-10-CM | POA: Diagnosis present

## 2016-02-28 DIAGNOSIS — E785 Hyperlipidemia, unspecified: Secondary | ICD-10-CM | POA: Diagnosis present

## 2016-02-28 DIAGNOSIS — I129 Hypertensive chronic kidney disease with stage 1 through stage 4 chronic kidney disease, or unspecified chronic kidney disease: Secondary | ICD-10-CM | POA: Diagnosis present

## 2016-02-28 DIAGNOSIS — R509 Fever, unspecified: Secondary | ICD-10-CM | POA: Diagnosis not present

## 2016-02-28 DIAGNOSIS — A419 Sepsis, unspecified organism: Secondary | ICD-10-CM | POA: Diagnosis not present

## 2016-02-28 DIAGNOSIS — Z7952 Long term (current) use of systemic steroids: Secondary | ICD-10-CM | POA: Diagnosis not present

## 2016-03-01 DIAGNOSIS — R509 Fever, unspecified: Secondary | ICD-10-CM | POA: Diagnosis not present

## 2016-03-01 DIAGNOSIS — R6521 Severe sepsis with septic shock: Secondary | ICD-10-CM | POA: Diagnosis not present

## 2016-03-01 DIAGNOSIS — N170 Acute kidney failure with tubular necrosis: Secondary | ICD-10-CM | POA: Diagnosis present

## 2016-03-01 DIAGNOSIS — E272 Addisonian crisis: Secondary | ICD-10-CM | POA: Diagnosis not present

## 2016-03-01 DIAGNOSIS — R7989 Other specified abnormal findings of blood chemistry: Secondary | ICD-10-CM | POA: Diagnosis not present

## 2016-03-01 DIAGNOSIS — R918 Other nonspecific abnormal finding of lung field: Secondary | ICD-10-CM | POA: Diagnosis not present

## 2016-03-01 DIAGNOSIS — N390 Urinary tract infection, site not specified: Secondary | ICD-10-CM | POA: Diagnosis not present

## 2016-03-01 DIAGNOSIS — Z7952 Long term (current) use of systemic steroids: Secondary | ICD-10-CM | POA: Diagnosis not present

## 2016-03-01 DIAGNOSIS — E870 Hyperosmolality and hypernatremia: Secondary | ICD-10-CM | POA: Diagnosis not present

## 2016-03-01 DIAGNOSIS — Z743 Need for continuous supervision: Secondary | ICD-10-CM | POA: Diagnosis not present

## 2016-03-01 DIAGNOSIS — R9431 Abnormal electrocardiogram [ECG] [EKG]: Secondary | ICD-10-CM | POA: Diagnosis not present

## 2016-03-01 DIAGNOSIS — Z794 Long term (current) use of insulin: Secondary | ICD-10-CM | POA: Diagnosis not present

## 2016-03-01 DIAGNOSIS — E271 Primary adrenocortical insufficiency: Secondary | ICD-10-CM | POA: Diagnosis not present

## 2016-03-01 DIAGNOSIS — R748 Abnormal levels of other serum enzymes: Secondary | ICD-10-CM | POA: Diagnosis not present

## 2016-03-01 DIAGNOSIS — R000 Tachycardia, unspecified: Secondary | ICD-10-CM | POA: Diagnosis not present

## 2016-03-01 DIAGNOSIS — I248 Other forms of acute ischemic heart disease: Secondary | ICD-10-CM | POA: Diagnosis present

## 2016-03-01 DIAGNOSIS — Z951 Presence of aortocoronary bypass graft: Secondary | ICD-10-CM | POA: Diagnosis not present

## 2016-03-01 DIAGNOSIS — I129 Hypertensive chronic kidney disease with stage 1 through stage 4 chronic kidney disease, or unspecified chronic kidney disease: Secondary | ICD-10-CM | POA: Diagnosis not present

## 2016-03-01 DIAGNOSIS — E108 Type 1 diabetes mellitus with unspecified complications: Secondary | ICD-10-CM | POA: Diagnosis present

## 2016-03-01 DIAGNOSIS — R579 Shock, unspecified: Secondary | ICD-10-CM | POA: Diagnosis not present

## 2016-03-01 DIAGNOSIS — A419 Sepsis, unspecified organism: Secondary | ICD-10-CM | POA: Diagnosis not present

## 2016-03-01 DIAGNOSIS — E1065 Type 1 diabetes mellitus with hyperglycemia: Secondary | ICD-10-CM | POA: Diagnosis not present

## 2016-03-01 DIAGNOSIS — I2510 Atherosclerotic heart disease of native coronary artery without angina pectoris: Secondary | ICD-10-CM | POA: Diagnosis not present

## 2016-03-01 DIAGNOSIS — I081 Rheumatic disorders of both mitral and tricuspid valves: Secondary | ICD-10-CM | POA: Diagnosis not present

## 2016-03-01 DIAGNOSIS — J9601 Acute respiratory failure with hypoxia: Secondary | ICD-10-CM | POA: Diagnosis not present

## 2016-03-01 DIAGNOSIS — I10 Essential (primary) hypertension: Secondary | ICD-10-CM | POA: Diagnosis not present

## 2016-03-01 DIAGNOSIS — I270 Primary pulmonary hypertension: Secondary | ICD-10-CM | POA: Diagnosis not present

## 2016-03-01 DIAGNOSIS — N179 Acute kidney failure, unspecified: Secondary | ICD-10-CM | POA: Diagnosis not present

## 2016-03-01 DIAGNOSIS — Z7982 Long term (current) use of aspirin: Secondary | ICD-10-CM | POA: Diagnosis not present

## 2016-03-01 DIAGNOSIS — N189 Chronic kidney disease, unspecified: Secondary | ICD-10-CM | POA: Diagnosis not present

## 2016-03-01 DIAGNOSIS — R112 Nausea with vomiting, unspecified: Secondary | ICD-10-CM | POA: Diagnosis not present

## 2016-03-01 DIAGNOSIS — E1022 Type 1 diabetes mellitus with diabetic chronic kidney disease: Secondary | ICD-10-CM | POA: Diagnosis not present

## 2016-03-01 DIAGNOSIS — E039 Hypothyroidism, unspecified: Secondary | ICD-10-CM | POA: Diagnosis not present

## 2016-03-01 DIAGNOSIS — Z9641 Presence of insulin pump (external) (internal): Secondary | ICD-10-CM | POA: Diagnosis not present

## 2016-03-01 DIAGNOSIS — B349 Viral infection, unspecified: Secondary | ICD-10-CM | POA: Diagnosis not present

## 2016-03-01 DIAGNOSIS — E785 Hyperlipidemia, unspecified: Secondary | ICD-10-CM | POA: Diagnosis not present

## 2016-03-01 DIAGNOSIS — J189 Pneumonia, unspecified organism: Secondary | ICD-10-CM | POA: Diagnosis not present

## 2016-03-01 DIAGNOSIS — N183 Chronic kidney disease, stage 3 (moderate): Secondary | ICD-10-CM | POA: Diagnosis not present

## 2016-03-07 DIAGNOSIS — R7989 Other specified abnormal findings of blood chemistry: Secondary | ICD-10-CM | POA: Insufficient documentation

## 2016-03-07 DIAGNOSIS — J189 Pneumonia, unspecified organism: Secondary | ICD-10-CM | POA: Insufficient documentation

## 2016-03-07 DIAGNOSIS — R778 Other specified abnormalities of plasma proteins: Secondary | ICD-10-CM | POA: Insufficient documentation

## 2016-03-07 DIAGNOSIS — R6521 Severe sepsis with septic shock: Secondary | ICD-10-CM | POA: Insufficient documentation

## 2016-03-07 DIAGNOSIS — A419 Sepsis, unspecified organism: Secondary | ICD-10-CM | POA: Insufficient documentation

## 2016-03-10 DIAGNOSIS — Z951 Presence of aortocoronary bypass graft: Secondary | ICD-10-CM | POA: Diagnosis not present

## 2016-03-10 DIAGNOSIS — Z7982 Long term (current) use of aspirin: Secondary | ICD-10-CM | POA: Diagnosis not present

## 2016-03-10 DIAGNOSIS — Z794 Long term (current) use of insulin: Secondary | ICD-10-CM | POA: Diagnosis not present

## 2016-03-10 DIAGNOSIS — N183 Chronic kidney disease, stage 3 (moderate): Secondary | ICD-10-CM | POA: Diagnosis not present

## 2016-03-10 DIAGNOSIS — E1022 Type 1 diabetes mellitus with diabetic chronic kidney disease: Secondary | ICD-10-CM | POA: Diagnosis not present

## 2016-03-10 DIAGNOSIS — J189 Pneumonia, unspecified organism: Secondary | ICD-10-CM | POA: Diagnosis not present

## 2016-03-10 DIAGNOSIS — Z9641 Presence of insulin pump (external) (internal): Secondary | ICD-10-CM | POA: Diagnosis not present

## 2016-03-10 DIAGNOSIS — Z7952 Long term (current) use of systemic steroids: Secondary | ICD-10-CM | POA: Diagnosis not present

## 2016-03-10 DIAGNOSIS — E271 Primary adrenocortical insufficiency: Secondary | ICD-10-CM | POA: Diagnosis not present

## 2016-03-10 DIAGNOSIS — E039 Hypothyroidism, unspecified: Secondary | ICD-10-CM | POA: Diagnosis not present

## 2016-03-10 DIAGNOSIS — I2510 Atherosclerotic heart disease of native coronary artery without angina pectoris: Secondary | ICD-10-CM | POA: Diagnosis not present

## 2016-03-13 DIAGNOSIS — N183 Chronic kidney disease, stage 3 (moderate): Secondary | ICD-10-CM | POA: Diagnosis not present

## 2016-03-13 DIAGNOSIS — E039 Hypothyroidism, unspecified: Secondary | ICD-10-CM | POA: Diagnosis not present

## 2016-03-13 DIAGNOSIS — I2510 Atherosclerotic heart disease of native coronary artery without angina pectoris: Secondary | ICD-10-CM | POA: Diagnosis not present

## 2016-03-13 DIAGNOSIS — E271 Primary adrenocortical insufficiency: Secondary | ICD-10-CM | POA: Diagnosis not present

## 2016-03-13 DIAGNOSIS — E1022 Type 1 diabetes mellitus with diabetic chronic kidney disease: Secondary | ICD-10-CM | POA: Diagnosis not present

## 2016-03-13 DIAGNOSIS — J189 Pneumonia, unspecified organism: Secondary | ICD-10-CM | POA: Diagnosis not present

## 2016-03-14 DIAGNOSIS — E271 Primary adrenocortical insufficiency: Secondary | ICD-10-CM | POA: Diagnosis not present

## 2016-03-14 DIAGNOSIS — I2510 Atherosclerotic heart disease of native coronary artery without angina pectoris: Secondary | ICD-10-CM | POA: Diagnosis not present

## 2016-03-14 DIAGNOSIS — J189 Pneumonia, unspecified organism: Secondary | ICD-10-CM | POA: Diagnosis not present

## 2016-03-14 DIAGNOSIS — N183 Chronic kidney disease, stage 3 (moderate): Secondary | ICD-10-CM | POA: Diagnosis not present

## 2016-03-14 DIAGNOSIS — E1022 Type 1 diabetes mellitus with diabetic chronic kidney disease: Secondary | ICD-10-CM | POA: Diagnosis not present

## 2016-03-14 DIAGNOSIS — E039 Hypothyroidism, unspecified: Secondary | ICD-10-CM | POA: Diagnosis not present

## 2016-03-16 DIAGNOSIS — E039 Hypothyroidism, unspecified: Secondary | ICD-10-CM | POA: Diagnosis not present

## 2016-03-16 DIAGNOSIS — I2510 Atherosclerotic heart disease of native coronary artery without angina pectoris: Secondary | ICD-10-CM | POA: Diagnosis not present

## 2016-03-16 DIAGNOSIS — E1022 Type 1 diabetes mellitus with diabetic chronic kidney disease: Secondary | ICD-10-CM | POA: Diagnosis not present

## 2016-03-16 DIAGNOSIS — N183 Chronic kidney disease, stage 3 (moderate): Secondary | ICD-10-CM | POA: Diagnosis not present

## 2016-03-16 DIAGNOSIS — E271 Primary adrenocortical insufficiency: Secondary | ICD-10-CM | POA: Diagnosis not present

## 2016-03-16 DIAGNOSIS — J189 Pneumonia, unspecified organism: Secondary | ICD-10-CM | POA: Diagnosis not present

## 2016-03-19 DIAGNOSIS — E271 Primary adrenocortical insufficiency: Secondary | ICD-10-CM | POA: Diagnosis not present

## 2016-03-19 DIAGNOSIS — I248 Other forms of acute ischemic heart disease: Secondary | ICD-10-CM | POA: Diagnosis not present

## 2016-03-19 DIAGNOSIS — D631 Anemia in chronic kidney disease: Secondary | ICD-10-CM | POA: Diagnosis not present

## 2016-03-19 DIAGNOSIS — E1022 Type 1 diabetes mellitus with diabetic chronic kidney disease: Secondary | ICD-10-CM | POA: Diagnosis not present

## 2016-03-19 DIAGNOSIS — N390 Urinary tract infection, site not specified: Secondary | ICD-10-CM | POA: Diagnosis not present

## 2016-03-19 DIAGNOSIS — E1169 Type 2 diabetes mellitus with other specified complication: Secondary | ICD-10-CM | POA: Diagnosis not present

## 2016-03-19 DIAGNOSIS — E782 Mixed hyperlipidemia: Secondary | ICD-10-CM | POA: Diagnosis not present

## 2016-03-19 DIAGNOSIS — A4189 Other specified sepsis: Secondary | ICD-10-CM | POA: Diagnosis not present

## 2016-03-19 DIAGNOSIS — N183 Chronic kidney disease, stage 3 (moderate): Secondary | ICD-10-CM | POA: Diagnosis not present

## 2016-03-19 DIAGNOSIS — I2510 Atherosclerotic heart disease of native coronary artery without angina pectoris: Secondary | ICD-10-CM | POA: Diagnosis not present

## 2016-03-19 DIAGNOSIS — E272 Addisonian crisis: Secondary | ICD-10-CM | POA: Diagnosis not present

## 2016-03-19 DIAGNOSIS — E1121 Type 2 diabetes mellitus with diabetic nephropathy: Secondary | ICD-10-CM | POA: Diagnosis not present

## 2016-03-19 DIAGNOSIS — E039 Hypothyroidism, unspecified: Secondary | ICD-10-CM | POA: Diagnosis not present

## 2016-03-19 DIAGNOSIS — J189 Pneumonia, unspecified organism: Secondary | ICD-10-CM | POA: Diagnosis not present

## 2016-03-21 DIAGNOSIS — N183 Chronic kidney disease, stage 3 (moderate): Secondary | ICD-10-CM | POA: Diagnosis not present

## 2016-03-21 DIAGNOSIS — E039 Hypothyroidism, unspecified: Secondary | ICD-10-CM | POA: Diagnosis not present

## 2016-03-21 DIAGNOSIS — J189 Pneumonia, unspecified organism: Secondary | ICD-10-CM | POA: Diagnosis not present

## 2016-03-21 DIAGNOSIS — I2510 Atherosclerotic heart disease of native coronary artery without angina pectoris: Secondary | ICD-10-CM | POA: Diagnosis not present

## 2016-03-21 DIAGNOSIS — E271 Primary adrenocortical insufficiency: Secondary | ICD-10-CM | POA: Diagnosis not present

## 2016-03-21 DIAGNOSIS — E1022 Type 1 diabetes mellitus with diabetic chronic kidney disease: Secondary | ICD-10-CM | POA: Diagnosis not present

## 2016-03-23 DIAGNOSIS — J189 Pneumonia, unspecified organism: Secondary | ICD-10-CM | POA: Diagnosis not present

## 2016-03-23 DIAGNOSIS — I2510 Atherosclerotic heart disease of native coronary artery without angina pectoris: Secondary | ICD-10-CM | POA: Diagnosis not present

## 2016-03-23 DIAGNOSIS — E271 Primary adrenocortical insufficiency: Secondary | ICD-10-CM | POA: Diagnosis not present

## 2016-03-23 DIAGNOSIS — N183 Chronic kidney disease, stage 3 (moderate): Secondary | ICD-10-CM | POA: Diagnosis not present

## 2016-03-23 DIAGNOSIS — E039 Hypothyroidism, unspecified: Secondary | ICD-10-CM | POA: Diagnosis not present

## 2016-03-23 DIAGNOSIS — E1022 Type 1 diabetes mellitus with diabetic chronic kidney disease: Secondary | ICD-10-CM | POA: Diagnosis not present

## 2016-03-26 DIAGNOSIS — E1022 Type 1 diabetes mellitus with diabetic chronic kidney disease: Secondary | ICD-10-CM | POA: Diagnosis not present

## 2016-03-26 DIAGNOSIS — I2510 Atherosclerotic heart disease of native coronary artery without angina pectoris: Secondary | ICD-10-CM | POA: Diagnosis not present

## 2016-03-26 DIAGNOSIS — N183 Chronic kidney disease, stage 3 (moderate): Secondary | ICD-10-CM | POA: Diagnosis not present

## 2016-03-26 DIAGNOSIS — E271 Primary adrenocortical insufficiency: Secondary | ICD-10-CM | POA: Diagnosis not present

## 2016-03-26 DIAGNOSIS — J189 Pneumonia, unspecified organism: Secondary | ICD-10-CM | POA: Diagnosis not present

## 2016-03-26 DIAGNOSIS — E039 Hypothyroidism, unspecified: Secondary | ICD-10-CM | POA: Diagnosis not present

## 2016-03-28 DIAGNOSIS — E039 Hypothyroidism, unspecified: Secondary | ICD-10-CM | POA: Diagnosis not present

## 2016-03-28 DIAGNOSIS — E271 Primary adrenocortical insufficiency: Secondary | ICD-10-CM | POA: Diagnosis not present

## 2016-03-28 DIAGNOSIS — I2510 Atherosclerotic heart disease of native coronary artery without angina pectoris: Secondary | ICD-10-CM | POA: Diagnosis not present

## 2016-03-28 DIAGNOSIS — J189 Pneumonia, unspecified organism: Secondary | ICD-10-CM | POA: Diagnosis not present

## 2016-03-28 DIAGNOSIS — N183 Chronic kidney disease, stage 3 (moderate): Secondary | ICD-10-CM | POA: Diagnosis not present

## 2016-03-28 DIAGNOSIS — E1022 Type 1 diabetes mellitus with diabetic chronic kidney disease: Secondary | ICD-10-CM | POA: Diagnosis not present

## 2016-04-02 DIAGNOSIS — Z9641 Presence of insulin pump (external) (internal): Secondary | ICD-10-CM | POA: Diagnosis not present

## 2016-04-02 DIAGNOSIS — N183 Chronic kidney disease, stage 3 (moderate): Secondary | ICD-10-CM | POA: Diagnosis not present

## 2016-04-02 DIAGNOSIS — Z794 Long term (current) use of insulin: Secondary | ICD-10-CM | POA: Diagnosis not present

## 2016-04-02 DIAGNOSIS — E271 Primary adrenocortical insufficiency: Secondary | ICD-10-CM | POA: Diagnosis not present

## 2016-04-02 DIAGNOSIS — E039 Hypothyroidism, unspecified: Secondary | ICD-10-CM | POA: Diagnosis not present

## 2016-04-02 DIAGNOSIS — E1065 Type 1 diabetes mellitus with hyperglycemia: Secondary | ICD-10-CM | POA: Diagnosis not present

## 2016-04-02 DIAGNOSIS — E1022 Type 1 diabetes mellitus with diabetic chronic kidney disease: Secondary | ICD-10-CM | POA: Diagnosis not present

## 2016-04-03 DIAGNOSIS — I2510 Atherosclerotic heart disease of native coronary artery without angina pectoris: Secondary | ICD-10-CM | POA: Diagnosis not present

## 2016-04-03 DIAGNOSIS — J189 Pneumonia, unspecified organism: Secondary | ICD-10-CM | POA: Diagnosis not present

## 2016-04-03 DIAGNOSIS — N183 Chronic kidney disease, stage 3 (moderate): Secondary | ICD-10-CM | POA: Diagnosis not present

## 2016-04-03 DIAGNOSIS — E039 Hypothyroidism, unspecified: Secondary | ICD-10-CM | POA: Diagnosis not present

## 2016-04-03 DIAGNOSIS — E271 Primary adrenocortical insufficiency: Secondary | ICD-10-CM | POA: Diagnosis not present

## 2016-04-03 DIAGNOSIS — E1022 Type 1 diabetes mellitus with diabetic chronic kidney disease: Secondary | ICD-10-CM | POA: Diagnosis not present

## 2016-04-05 DIAGNOSIS — I2510 Atherosclerotic heart disease of native coronary artery without angina pectoris: Secondary | ICD-10-CM | POA: Diagnosis not present

## 2016-04-05 DIAGNOSIS — J189 Pneumonia, unspecified organism: Secondary | ICD-10-CM | POA: Diagnosis not present

## 2016-04-05 DIAGNOSIS — N183 Chronic kidney disease, stage 3 (moderate): Secondary | ICD-10-CM | POA: Diagnosis not present

## 2016-04-05 DIAGNOSIS — E271 Primary adrenocortical insufficiency: Secondary | ICD-10-CM | POA: Diagnosis not present

## 2016-04-05 DIAGNOSIS — E1022 Type 1 diabetes mellitus with diabetic chronic kidney disease: Secondary | ICD-10-CM | POA: Diagnosis not present

## 2016-04-05 DIAGNOSIS — E039 Hypothyroidism, unspecified: Secondary | ICD-10-CM | POA: Diagnosis not present

## 2016-04-23 DIAGNOSIS — E039 Hypothyroidism, unspecified: Secondary | ICD-10-CM | POA: Diagnosis not present

## 2016-04-23 DIAGNOSIS — R5383 Other fatigue: Secondary | ICD-10-CM | POA: Diagnosis not present

## 2016-04-23 DIAGNOSIS — E782 Mixed hyperlipidemia: Secondary | ICD-10-CM | POA: Diagnosis not present

## 2016-04-23 DIAGNOSIS — I10 Essential (primary) hypertension: Secondary | ICD-10-CM | POA: Diagnosis not present

## 2016-04-23 DIAGNOSIS — E1169 Type 2 diabetes mellitus with other specified complication: Secondary | ICD-10-CM | POA: Diagnosis not present

## 2016-04-23 DIAGNOSIS — D631 Anemia in chronic kidney disease: Secondary | ICD-10-CM | POA: Diagnosis not present

## 2016-04-26 DIAGNOSIS — Z23 Encounter for immunization: Secondary | ICD-10-CM | POA: Diagnosis not present

## 2016-04-26 DIAGNOSIS — Z0001 Encounter for general adult medical examination with abnormal findings: Secondary | ICD-10-CM | POA: Diagnosis not present

## 2016-04-26 DIAGNOSIS — E039 Hypothyroidism, unspecified: Secondary | ICD-10-CM | POA: Diagnosis not present

## 2016-04-26 DIAGNOSIS — D631 Anemia in chronic kidney disease: Secondary | ICD-10-CM | POA: Diagnosis not present

## 2016-04-26 DIAGNOSIS — E2749 Other adrenocortical insufficiency: Secondary | ICD-10-CM | POA: Diagnosis not present

## 2016-04-26 DIAGNOSIS — I10 Essential (primary) hypertension: Secondary | ICD-10-CM | POA: Diagnosis not present

## 2016-04-26 DIAGNOSIS — E782 Mixed hyperlipidemia: Secondary | ICD-10-CM | POA: Diagnosis not present

## 2016-04-26 DIAGNOSIS — E1021 Type 1 diabetes mellitus with diabetic nephropathy: Secondary | ICD-10-CM | POA: Diagnosis not present

## 2016-06-21 DIAGNOSIS — Z9641 Presence of insulin pump (external) (internal): Secondary | ICD-10-CM | POA: Diagnosis not present

## 2016-06-21 DIAGNOSIS — I252 Old myocardial infarction: Secondary | ICD-10-CM | POA: Diagnosis not present

## 2016-06-21 DIAGNOSIS — E785 Hyperlipidemia, unspecified: Secondary | ICD-10-CM | POA: Diagnosis not present

## 2016-06-21 DIAGNOSIS — E10319 Type 1 diabetes mellitus with unspecified diabetic retinopathy without macular edema: Secondary | ICD-10-CM | POA: Diagnosis not present

## 2016-06-21 DIAGNOSIS — E1022 Type 1 diabetes mellitus with diabetic chronic kidney disease: Secondary | ICD-10-CM | POA: Diagnosis not present

## 2016-06-21 DIAGNOSIS — D631 Anemia in chronic kidney disease: Secondary | ICD-10-CM | POA: Diagnosis not present

## 2016-06-21 DIAGNOSIS — I2510 Atherosclerotic heart disease of native coronary artery without angina pectoris: Secondary | ICD-10-CM | POA: Diagnosis not present

## 2016-06-21 DIAGNOSIS — E1051 Type 1 diabetes mellitus with diabetic peripheral angiopathy without gangrene: Secondary | ICD-10-CM | POA: Diagnosis not present

## 2016-06-21 DIAGNOSIS — I129 Hypertensive chronic kidney disease with stage 1 through stage 4 chronic kidney disease, or unspecified chronic kidney disease: Secondary | ICD-10-CM | POA: Diagnosis not present

## 2016-06-21 DIAGNOSIS — E875 Hyperkalemia: Secondary | ICD-10-CM | POA: Diagnosis not present

## 2016-06-21 DIAGNOSIS — E1042 Type 1 diabetes mellitus with diabetic polyneuropathy: Secondary | ICD-10-CM | POA: Diagnosis not present

## 2016-06-21 DIAGNOSIS — E271 Primary adrenocortical insufficiency: Secondary | ICD-10-CM | POA: Diagnosis not present

## 2016-06-21 DIAGNOSIS — N183 Chronic kidney disease, stage 3 (moderate): Secondary | ICD-10-CM | POA: Diagnosis not present

## 2016-06-21 DIAGNOSIS — N179 Acute kidney failure, unspecified: Secondary | ICD-10-CM | POA: Diagnosis not present

## 2016-06-21 DIAGNOSIS — Z951 Presence of aortocoronary bypass graft: Secondary | ICD-10-CM | POA: Diagnosis not present

## 2016-06-21 DIAGNOSIS — R809 Proteinuria, unspecified: Secondary | ICD-10-CM | POA: Diagnosis not present

## 2016-07-04 DIAGNOSIS — Z79899 Other long term (current) drug therapy: Secondary | ICD-10-CM | POA: Diagnosis not present

## 2016-07-04 DIAGNOSIS — Z4681 Encounter for fitting and adjustment of insulin pump: Secondary | ICD-10-CM | POA: Diagnosis not present

## 2016-07-04 DIAGNOSIS — E108 Type 1 diabetes mellitus with unspecified complications: Secondary | ICD-10-CM | POA: Diagnosis not present

## 2016-07-04 DIAGNOSIS — E1065 Type 1 diabetes mellitus with hyperglycemia: Secondary | ICD-10-CM | POA: Diagnosis not present

## 2016-07-04 DIAGNOSIS — E271 Primary adrenocortical insufficiency: Secondary | ICD-10-CM | POA: Diagnosis not present

## 2016-07-04 DIAGNOSIS — N183 Chronic kidney disease, stage 3 (moderate): Secondary | ICD-10-CM | POA: Diagnosis not present

## 2016-07-04 DIAGNOSIS — Z794 Long term (current) use of insulin: Secondary | ICD-10-CM | POA: Diagnosis not present

## 2016-07-04 DIAGNOSIS — Z9641 Presence of insulin pump (external) (internal): Secondary | ICD-10-CM | POA: Diagnosis not present

## 2016-07-04 DIAGNOSIS — E1022 Type 1 diabetes mellitus with diabetic chronic kidney disease: Secondary | ICD-10-CM | POA: Diagnosis not present

## 2016-07-04 DIAGNOSIS — E039 Hypothyroidism, unspecified: Secondary | ICD-10-CM | POA: Diagnosis not present

## 2016-07-12 DIAGNOSIS — Z1231 Encounter for screening mammogram for malignant neoplasm of breast: Secondary | ICD-10-CM | POA: Diagnosis not present

## 2016-07-17 DIAGNOSIS — E1342 Other specified diabetes mellitus with diabetic polyneuropathy: Secondary | ICD-10-CM | POA: Diagnosis not present

## 2016-07-17 DIAGNOSIS — B351 Tinea unguium: Secondary | ICD-10-CM | POA: Diagnosis not present

## 2016-07-17 DIAGNOSIS — L851 Acquired keratosis [keratoderma] palmaris et plantaris: Secondary | ICD-10-CM | POA: Diagnosis not present

## 2016-07-30 DIAGNOSIS — E113593 Type 2 diabetes mellitus with proliferative diabetic retinopathy without macular edema, bilateral: Secondary | ICD-10-CM | POA: Diagnosis not present

## 2016-07-30 DIAGNOSIS — Z961 Presence of intraocular lens: Secondary | ICD-10-CM | POA: Diagnosis not present

## 2016-07-30 DIAGNOSIS — H353132 Nonexudative age-related macular degeneration, bilateral, intermediate dry stage: Secondary | ICD-10-CM | POA: Diagnosis not present

## 2016-08-01 DIAGNOSIS — N189 Chronic kidney disease, unspecified: Secondary | ICD-10-CM | POA: Diagnosis not present

## 2016-08-13 DIAGNOSIS — Z124 Encounter for screening for malignant neoplasm of cervix: Secondary | ICD-10-CM | POA: Diagnosis not present

## 2016-08-13 DIAGNOSIS — N952 Postmenopausal atrophic vaginitis: Secondary | ICD-10-CM | POA: Diagnosis not present

## 2016-08-13 DIAGNOSIS — Z6822 Body mass index (BMI) 22.0-22.9, adult: Secondary | ICD-10-CM | POA: Diagnosis not present

## 2016-08-13 DIAGNOSIS — Z01419 Encounter for gynecological examination (general) (routine) without abnormal findings: Secondary | ICD-10-CM | POA: Diagnosis not present

## 2016-08-23 DIAGNOSIS — H353222 Exudative age-related macular degeneration, left eye, with inactive choroidal neovascularization: Secondary | ICD-10-CM | POA: Diagnosis not present

## 2016-08-23 DIAGNOSIS — E113593 Type 2 diabetes mellitus with proliferative diabetic retinopathy without macular edema, bilateral: Secondary | ICD-10-CM | POA: Diagnosis not present

## 2016-08-23 DIAGNOSIS — H353133 Nonexudative age-related macular degeneration, bilateral, advanced atrophic without subfoveal involvement: Secondary | ICD-10-CM | POA: Diagnosis not present

## 2016-08-23 DIAGNOSIS — H35373 Puckering of macula, bilateral: Secondary | ICD-10-CM | POA: Diagnosis not present

## 2016-09-04 DIAGNOSIS — N189 Chronic kidney disease, unspecified: Secondary | ICD-10-CM | POA: Diagnosis not present

## 2016-09-05 DIAGNOSIS — E872 Acidosis, unspecified: Secondary | ICD-10-CM | POA: Insufficient documentation

## 2016-09-12 ENCOUNTER — Encounter: Payer: Self-pay | Admitting: *Deleted

## 2016-09-12 ENCOUNTER — Ambulatory Visit (INDEPENDENT_AMBULATORY_CARE_PROVIDER_SITE_OTHER): Payer: Medicare Other | Admitting: Cardiology

## 2016-09-12 ENCOUNTER — Encounter: Payer: Self-pay | Admitting: Cardiology

## 2016-09-12 VITALS — BP 114/52 | HR 66 | Ht 61.0 in | Wt 121.4 lb

## 2016-09-12 DIAGNOSIS — E782 Mixed hyperlipidemia: Secondary | ICD-10-CM

## 2016-09-12 DIAGNOSIS — I2510 Atherosclerotic heart disease of native coronary artery without angina pectoris: Secondary | ICD-10-CM | POA: Diagnosis not present

## 2016-09-12 DIAGNOSIS — I10 Essential (primary) hypertension: Secondary | ICD-10-CM

## 2016-09-12 DIAGNOSIS — R0602 Shortness of breath: Secondary | ICD-10-CM | POA: Diagnosis not present

## 2016-09-12 DIAGNOSIS — I739 Peripheral vascular disease, unspecified: Secondary | ICD-10-CM

## 2016-09-12 NOTE — Progress Notes (Signed)
Clinical Summary Ms. Hatlestad is a 66 y.o.female seen today for follow up of the following medical problems.   1. CAD - previously followed by [facility]. 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  - LVEF March 2011 reported normal LVEF. Echo 04/2011 LVEF 55-60%. - 12/2014 Lexiscan without ischemia - breathing overall stable since last visit. Fairly sedentary lifestyle due to an old ankle injury. No recent chest pain - compliant with meds  - no recent chest pain. Can have some DOE at times, example sweeping floors at home which is somewhat new. No recent edema. No cough or wheezing   2. PAD - hx of right common femoral to popliteal bypass in [place] - followed by vascular  - reports recent leg pains over the last few months. Bilateral R>L thigh and calf, tightness/cramping feeling with walking. Better with rest.   3. Hyperlipidemia - 02/2015 TC 148 TG 38 HDL 104 LDL 36 - she remains compliant with statin  4. 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 Adrenal insufficiency - followed by endocrine  6.HTN - compliant with home bp meds  7. CKD - followed at [facility]   Past Medical History:  Diagnosis Date  . Cancer (HCC)    Squamous Cell  . Diabetes mellitus without complication ([place])   . Hyperlipemia   . Hypertension   . Hypothyroidism      Allergies  Allergen Reactions  . Bactrim [Sulfamethoxazole-Trimethoprim] Other (See Comments)    Elevates potassium.   . Tape Other (See Comments)    not allergic but causes sensitivity.  . Fluorescein Rash  . Procrit [Epoetin (Alfa)] Rash     Current Outpatient Prescriptions  Medication Sig Dispense Refill  . acetaminophen (TYLENOL) 500 MG tablet Take 500 mg by mouth daily as needed for mild pain or headache.    . Ascorbic Acid (VITAMIN C PO) Take 1 tablet by mouth daily.    Marland Kitchen aspirin EC 81 MG tablet Take 81 mg by mouth daily.    Marland Kitchen atorvastatin  (LIPITOR) 80 MG tablet Take 1 tablet (80 mg total) by mouth daily. 30 tablet 6  . cholecalciferol (VITAMIN D) 1000 UNITS tablet Take 1,000 Units by mouth daily.    . fludrocortisone (FLORINEF) 0.1 MG tablet Take 0.5 tablets by mouth daily.    Marland Kitchen HUMALOG 100 UNIT/ML injection as directed.    . iron polysaccharides (FERREX 150) 150 MG capsule Take 150 mg by mouth daily.    Marland Kitchen levothyroxine (SYNTHROID, LEVOTHROID) 100 MCG tablet Take 1 tablet by mouth daily.    . magnesium chloride (SLOW-MAG) 64 MG TBEC SR tablet Take 1 tablet by mouth 3 (three) times daily.     . Multiple Vitamin (MULTIVITAMIN WITH MINERALS) TABS tablet Take 1 tablet by mouth daily.    . Multiple Vitamins-Minerals (PRESERVISION AREDS 2 PO) Take 1 tablet by mouth daily.     . Omega-3 Fatty Acids (FISH OIL) 1000 MG CAPS Take 1 capsule by mouth daily.    . predniSONE (DELTASONE) 5 MG tablet Take 5 mg by mouth daily with breakfast.    . sodium polystyrene (KAYEXALATE) 15 GM/60ML suspension Take 15 g by mouth 2 (two) times a week.      No current facility-administered medications for this visit.      Past Surgical History:  Procedure Laterality Date  . APPENDECTOMY    . CATARACT EXTRACTION Bilateral   . CORONARY ARTERY BYPASS  GRAFT    . TONSILLECTOMY    . YAG LASER APPLICATION Right 02/21/8655   Procedure: YAG LASER APPLICATION;  Surgeon: Williams Che, MD;  Location: [facility];  Service: Ophthalmology;  Laterality: Right;     Allergies  Allergen Reactions  . Bactrim [Sulfamethoxazole-Trimethoprim] Other (See Comments)    Elevates potassium.   . Tape Other (See Comments)    not allergic but causes sensitivity.  . Fluorescein Rash  . Procrit [Epoetin (Alfa)] Rash      No family history on file.   Social History Ms. Lakins reports that she has never smoked. She has never used smokeless tobacco. Ms. Condrey has no alcohol history on file.   Review of Systems CONSTITUTIONAL: No weight loss, fever, chills, weakness  or fatigue.  HEENT: Eyes: No visual loss, blurred vision, double vision or yellow sclerae.No hearing loss, sneezing, congestion, runny nose or sore throat.  SKIN: No rash or itching.  CARDIOVASCULAR: per hpi RESPIRATORY: per hpi GASTROINTESTINAL: No anorexia, nausea, vomiting or diarrhea. No abdominal pain or blood.  GENITOURINARY: No burning on urination, no polyuria NEUROLOGICAL: No headache, dizziness, syncope, paralysis, ataxia, numbness or tingling in the extremities. No change in bowel or bladder control.  MUSCULOSKELETAL: No muscle, back pain, joint pain or stiffness.  LYMPHATICS: No enlarged nodes. No history of splenectomy.  PSYCHIATRIC: No history of depression or anxiety.  ENDOCRINOLOGIC: No reports of sweating, cold or heat intolerance. No polyuria or polydipsia.  Marland Kitchen   Physical Examination Vitals:   09/12/16 0819  BP: (!) 114/52  Pulse: 66  SpO2: 97%   Vitals:   09/12/16 0819  Weight: 121 lb 6.4 oz (55.1 kg)  Height: 5\' 1"  (1.549 m)    Gen: resting comfortably, no acute distress HEENT: no scleral icterus, pupils equal round and reactive, no palptable cervical adenopathy,  CV: RRR, 2/6 systolic murmur rusb, no jvd Resp: Clear to auscultation bilaterally GI: abdomen is soft, non-tender, non-distended, normal bowel sounds, no hepatosplenomegaly MSK: extremities are warm, no edema.  Skin: warm, no rash Neuro:  no focal deficits Psych: appropriate affect   Diagnostic Studies  04/2011 Echo FINDINGS:  LEFT VENTRICLE The left ventricular size is normal. There is normal left ventricular wall  thickness. Left ventricular systolic function is normal. LV ejection fraction  = 55-60%. Left ventricular filling pattern is indeterminate. The left  ventricular wall motion is normal. LV WALL MOTION -  RIGHT VENTRICLE The right ventricle is normal in size and function. The right ventricular  systolic function is normal. The right ventricular wall motion is normal.  LEFT  ATRIUM The left atrium is borderline dilated.  RIGHT ATRIUM  Right atrial size is normal. - AORTIC VALVE The aortic valve is normal in structure and function. The aortic valve opens  well. Trace (trivial) aortic regurgitation. - MITRAL VALVE The mitral valve leaflets appear normal. There is no evidence of stenosis,  fluttering, or prolapse. There is mild mitral regurgitation. - TRICUSPID VALVE Structurally normal tricuspid valve. There is mild tricuspid regurgitation. - PULMONIC VALVE The pulmonic valve is normal in structure and function. Trace pulmonic  valvular regurgitation. - ARTERIES The aortic root is normal size. - VENOUS Pulmonary venous flow pattern is blunted. IVC size was normal. - EFFUSION There is no pericardial effusion. - - MMode/2D Measurements &Calculations IVSd: 0.95 cm LVIDd: 3.9 cm LVPWd: 0.99 cm LVIDs: 2.4 cm LA dim: 3.8 cm Ao root: 2.5 cm EDV(MOD-sp4): 24.0 ml ESV(MOD-sp4): 11.0 ml LVOT diam: 1.5 cm LAV(MOD-sp4): 41.5 ml Doppler Measurements &  Calculations MV E max vel: 114.0 cm/sec MV A max vel: 82.4 cm/sec MV E/A: 1.4 Med Peak E' Vel: 7.6 cm/sec Lat Peak E' Vel: 12.2 cm/sec E/Lat E`: 9.4 E/Med E`: 15.0 MV dec time: 0.16 sec SV(LVOT): 45.4 ml Ao mean PG: 7.1 mmHg AVA (VTI): 1.3 cm2 LV V1 VTI: 27.2 cm TR max vel: 230.8 cm/sec TR max PG: 21.3 mmHg RVSP(TR): 26.3 mmHg RAP systole: 5.0 mmHg  11/2014 echo Study Conclusions  - Left ventricle: The cavity size was normal. Systolic function was normal. The estimated ejection fraction was in the range of 60% to 65%. Wall motion was normal; there were no regional wall motion abnormalities. Diastolic dysfunction, grade indeterminate. Medial annular velocity is slightly low with elevated E/e&' ratio of 18, indicative of elevated filling pressures. Mild to moderate concentric left ventricular hypertrophy. - Aortic valve: Moderately calcified annulus. Trileaflet. There  was mild regurgitation. - Mitral valve: Mildly calcified annulus. Mildly thickened leaflets . There was trivial regurgitation. - Right ventricle: Systolic function was normal. TAPSE: 17.6 mm . - Tricuspid valve: There was mild-moderate regurgitation. - Pulmonary arteries: Systolic pressure was mildly increased. PA peak pressure: 35 mm Hg (S).   12/2014 Lexiscan MPI  No diagnostic ST segment abnormalities. Rare PACs.  Very small fixed perfusion defect at the inferior apex most consistent with attenuation artifact rather than scar. No ischemic findings.  This is a low risk study.  Nuclear stress EF: 70%.     Assessment and Plan  1. CAD - fairly recent lexiscan without ischemia - some recent SOB/DOE. We will order an echo  2. PAD - she will f/u with vascular  3. Hyperlipidemia -she will continue statin.    4. HTN - . Due to adrenal insufficiency prior issues have been more related to hypotension - bp at goal, continue current   F/u 3 months   Arnoldo Lenis, M.D.

## 2016-09-12 NOTE — Patient Instructions (Signed)
Your physician recommends that you schedule a follow-up appointment in: 3 MONTHS WITH DR BRANCH  Your physician recommends that you continue on your current medications as directed. Please refer to the Current Medication list given to you today.  Your physician has requested that you have an echocardiogram. Echocardiography is a painless test that uses sound waves to create images of your heart. It provides your doctor with information about the size and shape of your heart and how well your heart's chambers and valves are working. This procedure takes approximately one hour. There are no restrictions for this procedure.   Thank you for choosing [facility]!!    

## 2016-09-18 ENCOUNTER — Ambulatory Visit (INDEPENDENT_AMBULATORY_CARE_PROVIDER_SITE_OTHER)
Admission: RE | Admit: 2016-09-18 | Discharge: 2016-09-18 | Disposition: A | Discharge status: Home / Self Care | Payer: Medicare Other | Source: Ambulatory Visit | Attending: Surgery | Admitting: Surgery

## 2016-09-18 ENCOUNTER — Ambulatory Visit (HOSPITAL_COMMUNITY)
Admission: RE | Admit: 2016-09-18 | Discharge: 2016-09-18 | Disposition: A | Discharge status: Home / Self Care | Payer: Medicare Other | Source: Ambulatory Visit | Attending: Vascular Surgery | Admitting: Vascular Surgery

## 2016-09-18 DIAGNOSIS — I70211 Atherosclerosis of native arteries of extremities with intermittent claudication, right leg: Secondary | ICD-10-CM

## 2016-09-18 DIAGNOSIS — I771 Stricture of artery: Secondary | ICD-10-CM | POA: Insufficient documentation

## 2016-09-18 DIAGNOSIS — Z95820 Peripheral vascular angioplasty status with implants and grafts: Secondary | ICD-10-CM | POA: Insufficient documentation

## 2016-09-20 ENCOUNTER — Ambulatory Visit (INDEPENDENT_AMBULATORY_CARE_PROVIDER_SITE_OTHER): Payer: Medicare Other

## 2016-09-20 ENCOUNTER — Other Ambulatory Visit: Payer: Self-pay

## 2016-09-20 DIAGNOSIS — R0602 Shortness of breath: Secondary | ICD-10-CM

## 2016-09-25 DIAGNOSIS — E1342 Other specified diabetes mellitus with diabetic polyneuropathy: Secondary | ICD-10-CM | POA: Diagnosis not present

## 2016-09-25 DIAGNOSIS — B351 Tinea unguium: Secondary | ICD-10-CM | POA: Diagnosis not present

## 2016-09-25 DIAGNOSIS — L851 Acquired keratosis [keratoderma] palmaris et plantaris: Secondary | ICD-10-CM | POA: Diagnosis not present

## 2016-09-26 ENCOUNTER — Telehealth: Payer: Self-pay | Admitting: *Deleted

## 2016-09-26 NOTE — Telephone Encounter (Signed)
Pt requesting echo results done on 8/30 - pt aware that Dr Harl Bowie hasn't dictated as of yet but would forward message - pt voiced understanding

## 2016-09-27 NOTE — Telephone Encounter (Signed)
-----   Message from Arnoldo Lenis, MD sent at 09/27/2016  9:51 AM EDT ----- Echo looks good. Heart pumping function remains normal. She does have some stiffness to the heart muscle which we have seen before, this can cause some SOB at times. The treatment for this is primarily controlling her bp. Sometimes we try to limit salt intake of start a fluid pill but for her given her adrenal problems would not consider at this time   Zandra Abts MD

## 2016-09-27 NOTE — Telephone Encounter (Signed)
Pt aware voiced understanding - routed to pcp

## 2016-10-01 ENCOUNTER — Encounter: Payer: Self-pay | Admitting: Family

## 2016-10-02 DIAGNOSIS — N183 Chronic kidney disease, stage 3 (moderate): Secondary | ICD-10-CM | POA: Diagnosis not present

## 2016-10-02 DIAGNOSIS — Z4681 Encounter for fitting and adjustment of insulin pump: Secondary | ICD-10-CM | POA: Diagnosis not present

## 2016-10-02 DIAGNOSIS — E1022 Type 1 diabetes mellitus with diabetic chronic kidney disease: Secondary | ICD-10-CM | POA: Diagnosis not present

## 2016-10-02 DIAGNOSIS — E039 Hypothyroidism, unspecified: Secondary | ICD-10-CM | POA: Diagnosis not present

## 2016-10-02 DIAGNOSIS — E1065 Type 1 diabetes mellitus with hyperglycemia: Secondary | ICD-10-CM | POA: Diagnosis not present

## 2016-10-02 DIAGNOSIS — E10319 Type 1 diabetes mellitus with unspecified diabetic retinopathy without macular edema: Secondary | ICD-10-CM | POA: Diagnosis not present

## 2016-10-02 DIAGNOSIS — E271 Primary adrenocortical insufficiency: Secondary | ICD-10-CM | POA: Diagnosis not present

## 2016-10-02 DIAGNOSIS — Z794 Long term (current) use of insulin: Secondary | ICD-10-CM | POA: Diagnosis not present

## 2016-10-02 DIAGNOSIS — Z9641 Presence of insulin pump (external) (internal): Secondary | ICD-10-CM | POA: Diagnosis not present

## 2016-10-03 ENCOUNTER — Ambulatory Visit (INDEPENDENT_AMBULATORY_CARE_PROVIDER_SITE_OTHER): Payer: Medicare Other | Admitting: Family

## 2016-10-03 ENCOUNTER — Encounter: Payer: Self-pay | Admitting: Family

## 2016-10-03 VITALS — BP 131/61 | HR 78 | Temp 98.0°F | Resp 14 | Ht 62.0 in | Wt 119.0 lb

## 2016-10-03 DIAGNOSIS — I779 Disorder of arteries and arterioles, unspecified: Secondary | ICD-10-CM | POA: Diagnosis not present

## 2016-10-03 DIAGNOSIS — Z95828 Presence of other vascular implants and grafts: Secondary | ICD-10-CM

## 2016-10-03 DIAGNOSIS — I739 Peripheral vascular disease, unspecified: Secondary | ICD-10-CM | POA: Diagnosis not present

## 2016-10-03 NOTE — Patient Instructions (Signed)

## 2016-10-03 NOTE — Progress Notes (Signed)
[facility] [place]   CC: Follow up peripheral artery occlusive disease  History of Present Illness Kendra Walker is a 66 y.o. female who has a history of a right femoral to above-knee popliteal artery bypass graft with vein in 2008 in [place].  This was done for a nonhealing wound.  The patient had a right ankle fracture with orthopedic hardware and became infected.  Fortunately she was able to have all of this heal.  She has not had any vascular follow-up since her surgery.  She does state that she gets cramping in both calves with walking approximately 50 feet, relieved by rest.  Dr. Trula Slade last evaluated pt on 11-28-15. At that time her right leg bypass graft was widely patent with no evidence of stenosis.   Her walking is also limited by dyspnea, states her cardiologist is aware, Dr. Harl Bowie. She denies rest pain or open wounds.  The patient suffers from coronary artery disease.  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  She has a Lexi-scan from 2016 without ischemia.  She suffers from hypercholesterolemia which is treated with a statin.   She has a history of chronic renal insufficiency. 09-04-16 serum creatinine was 1.63 (review of records). Her nephrologist is connected with [facility].  She also suffers from Addison's disease.   She denies any hx of stroke or TIA.   Pt Diabetic: Yes, states yesterday her A1C was 8.1, was having too many hypoglycemic episodes. Her endocrinologist is in the [place] at [facility]. She was diagnosed with DM in her mid teens. She uses an insulin pump.  Pt smoker: non-smoker  Pt meds include: Statin :Yes Betablocker: No ASA: Yes Other anticoagulants/antiplatelets: no  Past Medical History:  Diagnosis Date  . Cancer (HCC)    Squamous Cell  . Diabetes mellitus without complication ([place])   . Hyperlipemia   . Hypertension   . Hypothyroidism     Social History Social History  Substance Use Topics   . Smoking status: Never Smoker  . Smokeless tobacco: Never Used  . Alcohol use No    Family History Family History  Problem Relation Age of Onset  . Heart disease Mother   . Hypertension Mother   . Hypertension Father     Past Surgical History:  Procedure Laterality Date  . APPENDECTOMY    . CATARACT EXTRACTION Bilateral   . CORONARY ARTERY BYPASS GRAFT    . TONSILLECTOMY    . YAG LASER APPLICATION Right 06/26/5407   Procedure: YAG LASER APPLICATION;  Surgeon: Williams Che, MD;  Location: [facility];  Service: Ophthalmology;  Laterality: Right;    Allergies  Allergen Reactions  . Bactrim [Sulfamethoxazole-Trimethoprim] Other (See Comments)    Elevates potassium.   . Tape Other (See Comments)    not allergic but causes sensitivity.  . Fluorescein Rash  . Procrit [Epoetin (Alfa)] Rash    Current Outpatient Prescriptions  Medication Sig Dispense Refill  . acetaminophen (TYLENOL) 500 MG tablet Take 500 mg by mouth daily as needed for mild pain or headache.    . Ascorbic Acid (VITAMIN C PO) Take 1 tablet by mouth daily.    Marland Kitchen aspirin EC 81 MG tablet Take 81 mg by mouth daily.    Marland Kitchen atorvastatin (LIPITOR) 80 MG tablet Take 1 tablet (80 mg total) by mouth daily. 30 tablet 6  . cholecalciferol (VITAMIN D) 1000 UNITS tablet Take 1,000 Units by mouth daily.    Marland Kitchen  fludrocortisone (FLORINEF) 0.1 MG tablet Take 0.5 tablets by mouth daily.    Marland Kitchen HUMALOG 100 UNIT/ML injection as directed.    . iron polysaccharides (FERREX 150) 150 MG capsule Take 150 mg by mouth daily.    Marland Kitchen levothyroxine (SYNTHROID, LEVOTHROID) 100 MCG tablet Take 1 tablet by mouth daily.    . magnesium chloride (SLOW-MAG) 64 MG TBEC SR tablet Take 1 tablet by mouth 3 (three) times daily.     . Multiple Vitamin (MULTIVITAMIN WITH MINERALS) TABS tablet Take 1 tablet by mouth daily.    . Multiple Vitamins-Minerals (PRESERVISION AREDS 2 PO) Take 1 tablet by mouth daily.     . Omega-3 Fatty Acids (FISH OIL) 1000 MG CAPS Take  1 capsule by mouth daily.    . Patiromer Sorbitex Calcium (VELTASSA PO) Take by mouth 2 (two) times a week. Packet    . predniSONE (DELTASONE) 5 MG tablet Take 5 mg by mouth daily with breakfast.    . SODIUM BICARBONATE PO Take by mouth daily. 10 gram     No current facility-administered medications for this visit.     ROS: See HPI for pertinent positives and negatives.   Physical Examination  Vitals:   10/03/16 1307  BP: 131/61  Pulse: 78  Resp: 14  Temp: 98 F (36.7 C)  SpO2: 97%  Weight: 119 lb (54 kg)  Height: 5\' 2"  (1.575 m)   Body mass index is 21.77 kg/m.  General: A&O x 3, WDWN, petite female, moon facies. Gait: mild limp Eyes: PERRLA. Pulmonary: Respirations are non labored, CTAB, good air movement Cardiac: regular rhythm with occasional to frequent premature contractions, no detected murmur.         Carotid Bruits Right Left   Negative Negative   Radial pulses are 1+ palpable bilaterally   Adominal aortic pulse is not palpable                         VASCULAR EXAM: Extremities without ischemic changes, without Gangrene; without open wounds. Right ankle with scar tissue and mild depression malformation with darkened discoloration, limited ROM of right ankle.                                                                                                           LE Pulses Right Left       FEMORAL  1+ palpable  1+ palpable        POPLITEAL  not palpable   not palpable       POSTERIOR TIBIAL  not palpable   not palpable        DORSALIS PEDIS      ANTERIOR TIBIAL 1+ palpable  2+ palpable    Abdomen: soft, NT, no palpable masses. Skin: no rashes, no ulcers noted. See Extremities.  Musculoskeletal: no muscle wasting or atrophy. See Extremities.  Neurologic: A&O X 3; Appropriate Affect ; SENSATION: normal; MOTOR FUNCTION:  moving all extremities equally, motor strength 5/5 in upper extremities, 3/5 in lower extremities.  Speech is fluent/normal. CN  2-12  intact.    ASSESSMENT: Kendra Walker is a 66 y.o. female who is s/p right femoral-popliteal bypass graft for infection/ulcer in 2008 in [place]. I discussed with Dr. Donzetta Matters the 305 cm/s velocity at the outflow artery from her right leg bypass graft, but her calf symptoms are equal; see Plan. There are no signs of ischemia in her feet or legs.    DATA  Right LE Arterial Duplex (09-18-16): Plaque in the proximal graft is not flow limiting. Right fem-pop bypass graft with no visualized stenosis, the distal anastomosis is somewhat difficult to visualize due to scar tissue and depth, however, increased velocity noted in the outflow artery (305 cm/s) suggesting 50-74% stenosis.  Increased velocity since the prior exam on 11-28-15.   ABI (Date: 09-18-16)  R:   ABI: 1.51 (was 1.08 on 11-28-15),   PT: bi  DP: bi  TBI:  0.81  L:   ABI: 1.13 (was 1.14),   PT: bi  DP: bi  TBI: 0.97  Right ABI is falsely elevated with dampened biphasic waveform. Normal left ABI with biphasic waveforms. Normal bilateral TBI.     PLAN:  Daily seated leg exercises discussed since her right ankle has limited ROM.  Based on the patient's vascular studies and examination, pt will return to clinic in 6 months with right LE arterial duplex and ABI's, see Dr. Trula Slade afterward. I advised her to notify us if she develops concerns re the circulation in her feet or legs.   I discussed in depth with the patient the nature of atherosclerosis, and emphasized the importance of maximal medical management including strict control of blood pressure, blood glucose, and lipid levels, obtaining regular exercise, and continued cessation of smoking.  The patient is aware that without maximal medical management the underlying atherosclerotic disease process will progress, limiting the benefit of any interventions.  The patient was given information about PAD including signs, symptoms, treatment, what symptoms should  prompt the patient to seek immediate medical care, and risk reduction measures to take.  Clemon Chambers, RN, MSN, FNP-C [facility] Phone: [phone]  Clinic MD: Donzetta Matters  10/03/16 1:23 PM

## 2016-10-18 NOTE — Addendum Note (Signed)
Addended by: Lianne Cure A on: 10/18/2016 02:52 PM   Modules accepted: Orders

## 2016-10-22 DIAGNOSIS — D631 Anemia in chronic kidney disease: Secondary | ICD-10-CM | POA: Diagnosis not present

## 2016-10-22 DIAGNOSIS — I10 Essential (primary) hypertension: Secondary | ICD-10-CM | POA: Diagnosis not present

## 2016-10-22 DIAGNOSIS — E1121 Type 2 diabetes mellitus with diabetic nephropathy: Secondary | ICD-10-CM | POA: Diagnosis not present

## 2016-10-22 DIAGNOSIS — E782 Mixed hyperlipidemia: Secondary | ICD-10-CM | POA: Diagnosis not present

## 2016-10-22 DIAGNOSIS — E1169 Type 2 diabetes mellitus with other specified complication: Secondary | ICD-10-CM | POA: Diagnosis not present

## 2016-10-22 DIAGNOSIS — N189 Chronic kidney disease, unspecified: Secondary | ICD-10-CM | POA: Diagnosis not present

## 2016-10-22 DIAGNOSIS — E559 Vitamin D deficiency, unspecified: Secondary | ICD-10-CM | POA: Diagnosis not present

## 2016-10-22 DIAGNOSIS — E039 Hypothyroidism, unspecified: Secondary | ICD-10-CM | POA: Diagnosis not present

## 2016-10-22 DIAGNOSIS — R5383 Other fatigue: Secondary | ICD-10-CM | POA: Diagnosis not present

## 2016-10-24 DIAGNOSIS — E1021 Type 1 diabetes mellitus with diabetic nephropathy: Secondary | ICD-10-CM | POA: Diagnosis not present

## 2016-10-24 DIAGNOSIS — D631 Anemia in chronic kidney disease: Secondary | ICD-10-CM | POA: Diagnosis not present

## 2016-10-24 DIAGNOSIS — Z23 Encounter for immunization: Secondary | ICD-10-CM | POA: Diagnosis not present

## 2016-10-24 DIAGNOSIS — I5032 Chronic diastolic (congestive) heart failure: Secondary | ICD-10-CM | POA: Diagnosis not present

## 2016-10-24 DIAGNOSIS — E782 Mixed hyperlipidemia: Secondary | ICD-10-CM | POA: Diagnosis not present

## 2016-10-24 DIAGNOSIS — I10 Essential (primary) hypertension: Secondary | ICD-10-CM | POA: Diagnosis not present

## 2016-10-24 DIAGNOSIS — I739 Peripheral vascular disease, unspecified: Secondary | ICD-10-CM | POA: Diagnosis not present

## 2016-10-24 DIAGNOSIS — Z6821 Body mass index (BMI) 21.0-21.9, adult: Secondary | ICD-10-CM | POA: Diagnosis not present

## 2016-12-03 ENCOUNTER — Encounter (HOSPITAL_COMMUNITY): Payer: Medicare Other

## 2016-12-03 ENCOUNTER — Ambulatory Visit: Payer: Medicare Other | Admitting: Surgery

## 2016-12-04 DIAGNOSIS — B351 Tinea unguium: Secondary | ICD-10-CM | POA: Diagnosis not present

## 2016-12-04 DIAGNOSIS — L851 Acquired keratosis [keratoderma] palmaris et plantaris: Secondary | ICD-10-CM | POA: Diagnosis not present

## 2016-12-04 DIAGNOSIS — E1342 Other specified diabetes mellitus with diabetic polyneuropathy: Secondary | ICD-10-CM | POA: Diagnosis not present

## 2016-12-24 ENCOUNTER — Encounter: Payer: Self-pay | Admitting: *Deleted

## 2016-12-24 ENCOUNTER — Encounter: Payer: Self-pay | Admitting: Cardiology

## 2016-12-24 ENCOUNTER — Other Ambulatory Visit: Payer: Self-pay

## 2016-12-24 ENCOUNTER — Ambulatory Visit (INDEPENDENT_AMBULATORY_CARE_PROVIDER_SITE_OTHER): Payer: Medicare Other | Admitting: Cardiology

## 2016-12-24 VITALS — BP 126/62 | HR 74 | Ht 62.0 in | Wt 120.0 lb

## 2016-12-24 DIAGNOSIS — I2510 Atherosclerotic heart disease of native coronary artery without angina pectoris: Secondary | ICD-10-CM | POA: Diagnosis not present

## 2016-12-24 DIAGNOSIS — I779 Disorder of arteries and arterioles, unspecified: Secondary | ICD-10-CM

## 2016-12-24 DIAGNOSIS — E782 Mixed hyperlipidemia: Secondary | ICD-10-CM

## 2016-12-24 DIAGNOSIS — I10 Essential (primary) hypertension: Secondary | ICD-10-CM

## 2016-12-24 NOTE — Patient Instructions (Signed)

## 2016-12-24 NOTE — Progress Notes (Signed)
Clinical Summary Kendra Walker is a 66 y.o.female seen today for follow up of the following medical problems.   1. CAD - previously followed by [facility]. 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  - LVEF March 2011 reported normal LVEF. Echo 04/2011 LVEF 55-60%. - 12/2014 Lexiscan without ischemia - 08/2016 echo LVEF 60-65%, grade II diastolic dysfunction   - no recent chest pain. Can have some DOE at times, example sweeping floors at home which is somewhat new. No recent edema. No cough or wheezing  - breathing is overall stable. No recent edema.  - no recent chest pain - no coughing   2. PAD - hx of right common femoral to popliteal bypass in [place] - followed by vascular   3. Hyperlipidemia - 02/2015 TC 148 TG 38 HDL 104 LDL 36 - compliant with statin  4. 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 Adrenal insufficiency - followed by endocrine  6.HTN - she is compliant withmeds  7. CKD - followed at [facility]  8. Chronic diastolic HF - echo 06/3873 LVEF 60-65%, grade II diastolic dysfunction. Mild to moderate RV dysfunction. PASP 23.  - reports some SOB, but no LE edema or orthopnea, no PND  .  Past Medical History:  Diagnosis Date  . Cancer (HCC)    Squamous Cell  . Diabetes mellitus without complication ([place])   . Hyperlipemia   . Hypertension   . Hypothyroidism      Allergies  Allergen Reactions  . Bactrim [Sulfamethoxazole-Trimethoprim] Other (See Comments)    Elevates potassium.   . Tape Other (See Comments)    not allergic but causes sensitivity.  . Fluorescein Rash  . Procrit [Epoetin (Alfa)] Rash     Current Outpatient Medications  Medication Sig Dispense Refill  . acetaminophen (TYLENOL) 500 MG tablet Take 500 mg by mouth daily as needed for mild pain or headache.    . Ascorbic Acid (VITAMIN C PO) Take 1 tablet by mouth daily.    Marland Kitchen aspirin EC 81 MG tablet Take 81 mg by  mouth daily.    Marland Kitchen atorvastatin (LIPITOR) 80 MG tablet Take 1 tablet (80 mg total) by mouth daily. 30 tablet 6  . cholecalciferol (VITAMIN D) 1000 UNITS tablet Take 1,000 Units by mouth daily.    . fludrocortisone (FLORINEF) 0.1 MG tablet Take 0.5 tablets by mouth daily.    Marland Kitchen HUMALOG 100 UNIT/ML injection as directed.    . iron polysaccharides (FERREX 150) 150 MG capsule Take 150 mg by mouth daily.    Marland Kitchen levothyroxine (SYNTHROID, LEVOTHROID) 100 MCG tablet Take 1 tablet by mouth daily.    . magnesium chloride (SLOW-MAG) 64 MG TBEC SR tablet Take 1 tablet by mouth 3 (three) times daily.     . Multiple Vitamin (MULTIVITAMIN WITH MINERALS) TABS tablet Take 1 tablet by mouth daily.    . Multiple Vitamins-Minerals (PRESERVISION AREDS 2 PO) Take 1 tablet by mouth daily.     . Omega-3 Fatty Acids (FISH OIL) 1000 MG CAPS Take 1 capsule by mouth daily.    . Patiromer Sorbitex Calcium (VELTASSA PO) Take by mouth 2 (two) times a week. Packet    . predniSONE (DELTASONE) 5 MG tablet Take 5 mg by mouth daily with breakfast.    . SODIUM BICARBONATE PO Take by mouth daily. 10 gram     No current facility-administered medications for this visit.  Past Surgical History:  Procedure Laterality Date  . APPENDECTOMY    . CATARACT EXTRACTION Bilateral   . CORONARY ARTERY BYPASS GRAFT    . TONSILLECTOMY    . YAG LASER APPLICATION Right 4/66/5993   Procedure: YAG LASER APPLICATION;  Surgeon: Williams Che, MD;  Location: [facility];  Service: Ophthalmology;  Laterality: Right;     Allergies  Allergen Reactions  . Bactrim [Sulfamethoxazole-Trimethoprim] Other (See Comments)    Elevates potassium.   . Tape Other (See Comments)    not allergic but causes sensitivity.  . Fluorescein Rash  . Procrit [Epoetin (Alfa)] Rash      Family History  Problem Relation Age of Onset  . Heart disease Mother   . Hypertension Mother   . Hypertension Father      Social History Ms. Rother reports that  has  never smoked. she has never used smokeless tobacco. Ms. Pharo reports that she does not drink alcohol.   Review of Systems CONSTITUTIONAL: No weight loss, fever, chills, weakness or fatigue.  HEENT: Eyes: No visual loss, blurred vision, double vision or yellow sclerae.No hearing loss, sneezing, congestion, runny nose or sore throat.  SKIN: No rash or itching.  CARDIOVASCULAR: per hpi RESPIRATORY: per hpi GASTROINTESTINAL: No anorexia, nausea, vomiting or diarrhea. No abdominal pain or blood.  GENITOURINARY: No burning on urination, no polyuria NEUROLOGICAL: No headache, dizziness, syncope, paralysis, ataxia, numbness or tingling in the extremities. No change in bowel or bladder control.  MUSCULOSKELETAL: No muscle, back pain, joint pain or stiffness.  LYMPHATICS: No enlarged nodes. No history of splenectomy.  PSYCHIATRIC: No history of depression or anxiety.  ENDOCRINOLOGIC: No reports of sweating, cold or heat intolerance. No polyuria or polydipsia.  Marland Kitchen   Physical Examination Vitals:   12/24/16 1051  BP: 126/62  Pulse: 74  SpO2: 97%   Vitals:   12/24/16 1051  Weight: 120 lb (54.4 kg)  Height: 5\' 2"  (1.575 m)    Gen: resting comfortably, no acute distress HEENT: no scleral icterus, pupils equal round and reactive, no palptable cervical adenopathy,  CV: RRR, no m/r/g, no jvd Resp: Clear to auscultation bilaterally GI: abdomen is soft, non-tender, non-distended, normal bowel sounds, no hepatosplenomegaly MSK: extremities are warm, no edema.  Skin: warm, no rash Neuro:  no focal deficits Psych: appropriate affect   Diagnostic Studies  04/2011 Echo FINDINGS:  LEFT VENTRICLE The left ventricular size is normal. There is normal left ventricular wall  thickness. Left ventricular systolic function is normal. LV ejection fraction  = 55-60%. Left ventricular filling pattern is indeterminate. The left  ventricular wall motion is normal. LV WALL MOTION -  RIGHT  VENTRICLE The right ventricle is normal in size and function. The right ventricular  systolic function is normal. The right ventricular wall motion is normal.  LEFT ATRIUM The left atrium is borderline dilated.  RIGHT ATRIUM  Right atrial size is normal. - AORTIC VALVE The aortic valve is normal in structure and function. The aortic valve opens  well. Trace (trivial) aortic regurgitation. - MITRAL VALVE The mitral valve leaflets appear normal. There is no evidence of stenosis,  fluttering, or prolapse. There is mild mitral regurgitation. - TRICUSPID VALVE Structurally normal tricuspid valve. There is mild tricuspid regurgitation. - PULMONIC VALVE The pulmonic valve is normal in structure and function. Trace pulmonic  valvular regurgitation. - ARTERIES The aortic root is normal size. - VENOUS Pulmonary venous flow pattern is blunted. IVC size was normal. - EFFUSION There is no pericardial effusion. - -  MMode/2D Measurements &Calculations IVSd: 0.95 cm LVIDd: 3.9 cm LVPWd: 0.99 cm LVIDs: 2.4 cm LA dim: 3.8 cm Ao root: 2.5 cm EDV(MOD-sp4): 24.0 ml ESV(MOD-sp4): 11.0 ml LVOT diam: 1.5 cm LAV(MOD-sp4): 41.5 ml Doppler Measurements &Calculations MV E max vel: 114.0 cm/sec MV A max vel: 82.4 cm/sec MV E/A: 1.4 Med Peak E' Vel: 7.6 cm/sec Lat Peak E' Vel: 12.2 cm/sec E/Lat E`: 9.4 E/Med E`: 15.0 MV dec time: 0.16 sec SV(LVOT): 45.4 ml Ao mean PG: 7.1 mmHg AVA (VTI): 1.3 cm2 LV V1 VTI: 27.2 cm TR max vel: 230.8 cm/sec TR max PG: 21.3 mmHg RVSP(TR): 26.3 mmHg RAP systole: 5.0 mmHg  11/2014 echo Study Conclusions  - Left ventricle: The cavity size was normal. Systolic function was normal. The estimated ejection fraction was in the range of 60% to 65%. Wall motion was normal; there were no regional wall motion abnormalities. Diastolic dysfunction, grade indeterminate. Medial annular velocity is slightly low with elevated E/e&' ratio of 18,  indicative of elevated filling pressures. Mild to moderate concentric left ventricular hypertrophy. - Aortic valve: Moderately calcified annulus. Trileaflet. There was mild regurgitation. - Mitral valve: Mildly calcified annulus. Mildly thickened leaflets . There was trivial regurgitation. - Right ventricle: Systolic function was normal. TAPSE: 17.6 mm . - Tricuspid valve: There was mild-moderate regurgitation. - Pulmonary arteries: Systolic pressure was mildly increased. PA peak pressure: 35 mm Hg (S).   12/2014 Lexiscan MPI  No diagnostic ST segment abnormalities. Rare PACs.  Very small fixed perfusion defect at the inferior apex most consistent with attenuation artifact rather than scar. No ischemic findings.  This is a low risk study.  Nuclear stress EF: 70%.  08/2016 echo Study Conclusions  - Left ventricle: The cavity size was normal. Wall thickness was   normal. Systolic function was normal. The estimated ejection   fraction was in the range of 60% to 65%. Features are consistent   with a pseudonormal left ventricular filling pattern, with   concomitant abnormal relaxation and increased filling pressure   (grade 2 diastolic dysfunction). Doppler parameters are   consistent with high ventricular filling pressure. - Aortic valve: Moderately calcified annulus. Trileaflet. There was   mild regurgitation. - Mitral valve: Calcified annulus. There was mild regurgitation. - Right ventricle: Systolic function was mildly to moderately   reduced.   Assessment and Plan  1. CAD - fairly recent lexiscan without ischemia. Echo with stable LVEF, grade II diastolic dysfunction. Appears euvolemic. At this time no clear cardiac etiology of her SOB. Consider PFTs if progressive issue. If negative and symptoms progress could consider cath, however would not pursue at this time given recent reassuring cardiac studies. - continue current meds  2. PAD - she will f/u with  vascular  3. Hyperlipidemia -continue statin.    4. HTN - . Due to adrenal insufficiency prior issues have been more related to hypotension - bp at goal, continue to monitor  F/u 3 months      Arnoldo Lenis, M.D.

## 2016-12-27 ENCOUNTER — Encounter: Payer: Self-pay | Admitting: Cardiology

## 2017-01-01 DIAGNOSIS — E1065 Type 1 diabetes mellitus with hyperglycemia: Secondary | ICD-10-CM | POA: Diagnosis not present

## 2017-01-01 DIAGNOSIS — E039 Hypothyroidism, unspecified: Secondary | ICD-10-CM | POA: Diagnosis not present

## 2017-01-01 DIAGNOSIS — E10649 Type 1 diabetes mellitus with hypoglycemia without coma: Secondary | ICD-10-CM | POA: Diagnosis not present

## 2017-01-01 DIAGNOSIS — E271 Primary adrenocortical insufficiency: Secondary | ICD-10-CM | POA: Diagnosis not present

## 2017-01-01 DIAGNOSIS — E1022 Type 1 diabetes mellitus with diabetic chronic kidney disease: Secondary | ICD-10-CM | POA: Diagnosis not present

## 2017-01-01 DIAGNOSIS — Z4681 Encounter for fitting and adjustment of insulin pump: Secondary | ICD-10-CM | POA: Diagnosis not present

## 2017-01-01 DIAGNOSIS — E11649 Type 2 diabetes mellitus with hypoglycemia without coma: Secondary | ICD-10-CM | POA: Insufficient documentation

## 2017-01-01 DIAGNOSIS — Z794 Long term (current) use of insulin: Secondary | ICD-10-CM | POA: Diagnosis not present

## 2017-01-10 DIAGNOSIS — E1069 Type 1 diabetes mellitus with other specified complication: Secondary | ICD-10-CM | POA: Diagnosis not present

## 2017-02-07 DIAGNOSIS — E063 Autoimmune thyroiditis: Secondary | ICD-10-CM | POA: Diagnosis not present

## 2017-02-07 DIAGNOSIS — Z794 Long term (current) use of insulin: Secondary | ICD-10-CM | POA: Diagnosis not present

## 2017-02-07 DIAGNOSIS — E271 Primary adrenocortical insufficiency: Secondary | ICD-10-CM | POA: Diagnosis not present

## 2017-02-07 DIAGNOSIS — N179 Acute kidney failure, unspecified: Secondary | ICD-10-CM | POA: Diagnosis not present

## 2017-02-07 DIAGNOSIS — I2510 Atherosclerotic heart disease of native coronary artery without angina pectoris: Secondary | ICD-10-CM | POA: Diagnosis not present

## 2017-02-07 DIAGNOSIS — I129 Hypertensive chronic kidney disease with stage 1 through stage 4 chronic kidney disease, or unspecified chronic kidney disease: Secondary | ICD-10-CM | POA: Diagnosis not present

## 2017-02-07 DIAGNOSIS — D631 Anemia in chronic kidney disease: Secondary | ICD-10-CM | POA: Diagnosis not present

## 2017-02-07 DIAGNOSIS — E872 Acidosis: Secondary | ICD-10-CM | POA: Diagnosis not present

## 2017-02-07 DIAGNOSIS — E875 Hyperkalemia: Secondary | ICD-10-CM | POA: Diagnosis not present

## 2017-02-07 DIAGNOSIS — N183 Chronic kidney disease, stage 3 (moderate): Secondary | ICD-10-CM | POA: Diagnosis not present

## 2017-02-07 DIAGNOSIS — Z951 Presence of aortocoronary bypass graft: Secondary | ICD-10-CM | POA: Diagnosis not present

## 2017-02-07 DIAGNOSIS — Z9641 Presence of insulin pump (external) (internal): Secondary | ICD-10-CM | POA: Diagnosis not present

## 2017-02-07 DIAGNOSIS — E1022 Type 1 diabetes mellitus with diabetic chronic kidney disease: Secondary | ICD-10-CM | POA: Diagnosis not present

## 2017-02-27 DIAGNOSIS — Z6821 Body mass index (BMI) 21.0-21.9, adult: Secondary | ICD-10-CM | POA: Diagnosis not present

## 2017-02-27 DIAGNOSIS — E1021 Type 1 diabetes mellitus with diabetic nephropathy: Secondary | ICD-10-CM | POA: Diagnosis not present

## 2017-02-27 DIAGNOSIS — J09X2 Influenza due to identified novel influenza A virus with other respiratory manifestations: Secondary | ICD-10-CM | POA: Diagnosis not present

## 2017-02-27 DIAGNOSIS — I10 Essential (primary) hypertension: Secondary | ICD-10-CM | POA: Diagnosis not present

## 2017-02-27 DIAGNOSIS — E782 Mixed hyperlipidemia: Secondary | ICD-10-CM | POA: Diagnosis not present

## 2017-02-27 DIAGNOSIS — E039 Hypothyroidism, unspecified: Secondary | ICD-10-CM | POA: Diagnosis not present

## 2017-02-27 DIAGNOSIS — D631 Anemia in chronic kidney disease: Secondary | ICD-10-CM | POA: Diagnosis not present

## 2017-03-12 DIAGNOSIS — D631 Anemia in chronic kidney disease: Secondary | ICD-10-CM | POA: Diagnosis not present

## 2017-03-12 DIAGNOSIS — I5032 Chronic diastolic (congestive) heart failure: Secondary | ICD-10-CM | POA: Diagnosis not present

## 2017-03-12 DIAGNOSIS — E782 Mixed hyperlipidemia: Secondary | ICD-10-CM | POA: Diagnosis not present

## 2017-03-12 DIAGNOSIS — I10 Essential (primary) hypertension: Secondary | ICD-10-CM | POA: Diagnosis not present

## 2017-03-12 DIAGNOSIS — J09X2 Influenza due to identified novel influenza A virus with other respiratory manifestations: Secondary | ICD-10-CM | POA: Diagnosis not present

## 2017-03-12 DIAGNOSIS — E1121 Type 2 diabetes mellitus with diabetic nephropathy: Secondary | ICD-10-CM | POA: Diagnosis not present

## 2017-03-12 DIAGNOSIS — E1021 Type 1 diabetes mellitus with diabetic nephropathy: Secondary | ICD-10-CM | POA: Diagnosis not present

## 2017-03-12 DIAGNOSIS — Z6821 Body mass index (BMI) 21.0-21.9, adult: Secondary | ICD-10-CM | POA: Diagnosis not present

## 2017-03-15 DIAGNOSIS — B351 Tinea unguium: Secondary | ICD-10-CM | POA: Diagnosis not present

## 2017-03-15 DIAGNOSIS — L851 Acquired keratosis [keratoderma] palmaris et plantaris: Secondary | ICD-10-CM | POA: Diagnosis not present

## 2017-03-15 DIAGNOSIS — E1342 Other specified diabetes mellitus with diabetic polyneuropathy: Secondary | ICD-10-CM | POA: Diagnosis not present

## 2017-03-21 ENCOUNTER — Other Ambulatory Visit: Payer: Self-pay

## 2017-03-25 DIAGNOSIS — E272 Addisonian crisis: Secondary | ICD-10-CM | POA: Diagnosis not present

## 2017-03-25 DIAGNOSIS — E1121 Type 2 diabetes mellitus with diabetic nephropathy: Secondary | ICD-10-CM | POA: Diagnosis not present

## 2017-03-25 DIAGNOSIS — E279 Disorder of adrenal gland, unspecified: Secondary | ICD-10-CM | POA: Diagnosis not present

## 2017-03-25 DIAGNOSIS — I10 Essential (primary) hypertension: Secondary | ICD-10-CM | POA: Diagnosis not present

## 2017-03-25 DIAGNOSIS — E782 Mixed hyperlipidemia: Secondary | ICD-10-CM | POA: Diagnosis not present

## 2017-03-25 DIAGNOSIS — E039 Hypothyroidism, unspecified: Secondary | ICD-10-CM | POA: Diagnosis not present

## 2017-03-25 DIAGNOSIS — D631 Anemia in chronic kidney disease: Secondary | ICD-10-CM | POA: Diagnosis not present

## 2017-03-28 DIAGNOSIS — Z6821 Body mass index (BMI) 21.0-21.9, adult: Secondary | ICD-10-CM | POA: Diagnosis not present

## 2017-03-28 DIAGNOSIS — D631 Anemia in chronic kidney disease: Secondary | ICD-10-CM | POA: Diagnosis not present

## 2017-03-28 DIAGNOSIS — E039 Hypothyroidism, unspecified: Secondary | ICD-10-CM | POA: Diagnosis not present

## 2017-03-28 DIAGNOSIS — I10 Essential (primary) hypertension: Secondary | ICD-10-CM | POA: Diagnosis not present

## 2017-03-28 DIAGNOSIS — E782 Mixed hyperlipidemia: Secondary | ICD-10-CM | POA: Diagnosis not present

## 2017-03-28 DIAGNOSIS — I739 Peripheral vascular disease, unspecified: Secondary | ICD-10-CM | POA: Diagnosis not present

## 2017-03-28 DIAGNOSIS — E1021 Type 1 diabetes mellitus with diabetic nephropathy: Secondary | ICD-10-CM | POA: Diagnosis not present

## 2017-03-28 DIAGNOSIS — H3530 Unspecified macular degeneration: Secondary | ICD-10-CM | POA: Diagnosis not present

## 2017-04-02 DIAGNOSIS — E271 Primary adrenocortical insufficiency: Secondary | ICD-10-CM | POA: Diagnosis not present

## 2017-04-02 DIAGNOSIS — E1065 Type 1 diabetes mellitus with hyperglycemia: Secondary | ICD-10-CM | POA: Diagnosis not present

## 2017-04-02 DIAGNOSIS — E109 Type 1 diabetes mellitus without complications: Secondary | ICD-10-CM | POA: Diagnosis not present

## 2017-04-02 DIAGNOSIS — N183 Chronic kidney disease, stage 3 (moderate): Secondary | ICD-10-CM | POA: Diagnosis not present

## 2017-04-02 DIAGNOSIS — Z794 Long term (current) use of insulin: Secondary | ICD-10-CM | POA: Diagnosis not present

## 2017-04-02 DIAGNOSIS — E039 Hypothyroidism, unspecified: Secondary | ICD-10-CM | POA: Diagnosis not present

## 2017-04-02 DIAGNOSIS — Z9641 Presence of insulin pump (external) (internal): Secondary | ICD-10-CM | POA: Diagnosis not present

## 2017-04-02 DIAGNOSIS — E108 Type 1 diabetes mellitus with unspecified complications: Secondary | ICD-10-CM | POA: Diagnosis not present

## 2017-04-02 DIAGNOSIS — E063 Autoimmune thyroiditis: Secondary | ICD-10-CM | POA: Diagnosis not present

## 2017-04-02 DIAGNOSIS — E10649 Type 1 diabetes mellitus with hypoglycemia without coma: Secondary | ICD-10-CM | POA: Diagnosis not present

## 2017-04-04 ENCOUNTER — Ambulatory Visit (HOSPITAL_COMMUNITY)
Admission: RE | Admit: 2017-04-04 | Discharge: 2017-04-04 | Disposition: A | Discharge status: Home / Self Care | Payer: Medicare Other | Source: Ambulatory Visit | Attending: Family | Admitting: Family

## 2017-04-04 ENCOUNTER — Ambulatory Visit (INDEPENDENT_AMBULATORY_CARE_PROVIDER_SITE_OTHER)
Admission: RE | Admit: 2017-04-04 | Discharge: 2017-04-04 | Disposition: A | Discharge status: Home / Self Care | Payer: Medicare Other | Source: Ambulatory Visit | Attending: Family | Admitting: Family

## 2017-04-04 DIAGNOSIS — Z95828 Presence of other vascular implants and grafts: Secondary | ICD-10-CM

## 2017-04-04 DIAGNOSIS — I739 Peripheral vascular disease, unspecified: Secondary | ICD-10-CM | POA: Diagnosis not present

## 2017-04-04 DIAGNOSIS — I779 Disorder of arteries and arterioles, unspecified: Secondary | ICD-10-CM | POA: Diagnosis not present

## 2017-04-08 ENCOUNTER — Ambulatory Visit (INDEPENDENT_AMBULATORY_CARE_PROVIDER_SITE_OTHER): Payer: Medicare Other | Admitting: Family

## 2017-04-08 ENCOUNTER — Other Ambulatory Visit (HOSPITAL_COMMUNITY): Payer: Medicare Other

## 2017-04-08 ENCOUNTER — Encounter (HOSPITAL_COMMUNITY): Payer: Medicare Other

## 2017-04-08 ENCOUNTER — Encounter: Payer: Self-pay | Admitting: Family

## 2017-04-08 VITALS — BP 151/62 | HR 80 | Temp 98.7°F | Resp 17 | Ht 62.0 in | Wt 117.6 lb

## 2017-04-08 DIAGNOSIS — E1051 Type 1 diabetes mellitus with diabetic peripheral angiopathy without gangrene: Secondary | ICD-10-CM | POA: Diagnosis not present

## 2017-04-08 DIAGNOSIS — I739 Peripheral vascular disease, unspecified: Secondary | ICD-10-CM | POA: Diagnosis not present

## 2017-04-08 DIAGNOSIS — I779 Disorder of arteries and arterioles, unspecified: Secondary | ICD-10-CM

## 2017-04-08 DIAGNOSIS — Z95828 Presence of other vascular implants and grafts: Secondary | ICD-10-CM | POA: Diagnosis not present

## 2017-04-08 NOTE — Progress Notes (Signed)
[facility] [place]   CC: Follow up peripheral artery occlusive disease  History of Present Illness Kendra Walker is a 67 y.o. female who has a history of a right femoral to above-knee popliteal artery bypass graft with vein in 2008 in [place]. This was done for a nonhealing wound. The patient had a right ankle fracture with orthopedic hardware and became infected. Fortunately she was able to have all of this heal. She has not had any vascular follow-up since her surgery when Dr. Trula Slade saw her on 11-28-15.   She does state that she gets cramping in both calves with walking approximately 50 feet, relieved by rest.  Dr. Trula Slade last evaluated pt on 11-28-15. At that time her right leg bypass graft was widely patent with no evidence of stenosis.   Her walking is also limited by dyspnea, states her cardiologist is aware, Dr. Harl Bowie. She denies rest pain or open wounds. She also states that her cardiologist is aware of occasional premature contractions of her cardiac rhythm.   The patient suffers from coronary artery disease. 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 She has a Lexi-scan from 2016 without ischemia. She suffers from hypercholesterolemia which is treated with a statin.  She has a history of chronic renal insufficiency. 09-04-16 serum creatinine was 1.63 (review of records). Her nephrologist is connected with [facility]  She also suffers from Addison's disease.   She denies any hx of stroke or TIA.   Pt Diabetic: Yes, states yesterday her A1C was 7.7 (had the flu, had to take extra steroids for Addison's), was having too many hypoglycemic episodes. Her endocrinologist is in the [place] at [facility] She was diagnosed with DM in her mid teens. She uses an insulin pump.  Pt smoker: non-smoker  Pt meds include: Statin :Yes Betablocker: No ASA: Yes Other anticoagulants/antiplatelets: no   Past Medical History:   Diagnosis Date  . Cancer (HCC)    Squamous Cell  . Diabetes mellitus without complication ([place])   . Hyperlipemia   . Hypertension   . Hypothyroidism     Social History Social History   Tobacco Use  . Smoking status: Never Smoker  . Smokeless tobacco: Never Used  Substance Use Topics  . Alcohol use: No    Alcohol/week: 0.0 oz  . Drug use: No    Family History Family History  Problem Relation Age of Onset  . Heart disease Mother   . Hypertension Mother   . Hypertension Father     Past Surgical History:  Procedure Laterality Date  . APPENDECTOMY    . CATARACT EXTRACTION Bilateral   . CORONARY ARTERY BYPASS GRAFT    . TONSILLECTOMY    . YAG LASER APPLICATION Right 0/16/5537   Procedure: YAG LASER APPLICATION;  Surgeon: Williams Che, MD;  Location: [facility];  Service: Ophthalmology;  Laterality: Right;    Allergies  Allergen Reactions  . Bactrim [Sulfamethoxazole-Trimethoprim] Other (See Comments)    Elevates potassium.   . Tape Other (See Comments)    not allergic but causes sensitivity.  . Fluorescein Rash  . Procrit [Epoetin (Alfa)] Rash    Current Outpatient Medications  Medication Sig Dispense Refill  . acetaminophen (TYLENOL) 500 MG tablet Take 500 mg by mouth daily as needed for mild pain or headache.    . Ascorbic Acid (VITAMIN C PO) Take 1 tablet by mouth daily.    Marland Kitchen aspirin EC 81 MG tablet Take 81  mg by mouth daily.    Marland Kitchen atorvastatin (LIPITOR) 80 MG tablet Take 1 tablet (80 mg total) by mouth daily. 30 tablet 6  . cholecalciferol (VITAMIN D) 1000 UNITS tablet Take 1,000 Units by mouth daily.    . fludrocortisone (FLORINEF) 0.1 MG tablet Take 0.5 tablets by mouth daily.    Marland Kitchen HUMALOG 100 UNIT/ML injection as directed.    . iron polysaccharides (FERREX 150) 150 MG capsule Take 150 mg by mouth daily.    Marland Kitchen levothyroxine (SYNTHROID, LEVOTHROID) 100 MCG tablet Take 1 tablet by mouth daily.    . magnesium chloride (SLOW-MAG) 64 MG TBEC SR tablet Take 1  tablet by mouth 3 (three) times daily.     . Multiple Vitamin (MULTIVITAMIN WITH MINERALS) TABS tablet Take 1 tablet by mouth daily.    . Multiple Vitamins-Minerals (PRESERVISION AREDS 2 PO) Take 1 tablet by mouth daily.     . Omega-3 Fatty Acids (FISH OIL) 1000 MG CAPS Take 1 capsule by mouth daily.    . Patiromer Sorbitex Calcium (VELTASSA PO) Take by mouth 2 (two) times a week. Packet    . predniSONE (DELTASONE) 5 MG tablet Take 5 mg by mouth daily with breakfast.    . SODIUM BICARBONATE PO Take by mouth daily. 10 gram    . VELTASSA 8.4 g packet MIX 8.4 GRAM IN ONE GLASS OF WATER AND DRINK EVERY MONDAY, [place] FROM OTHER MEDICATIONS  5   No current facility-administered medications for this visit.     ROS: See HPI for pertinent positives and negatives.   Physical Examination  Vitals:   04/08/17 1242 04/08/17 1245  BP: (!) 159/84 (!) 151/62  Pulse: 80   Resp: 17   Temp: 98.7 F (37.1 C)   TempSrc: Oral   SpO2: 97%   Weight: 117 lb 9.6 oz (53.3 kg)   Height: 5\' 2"  (1.575 m)    Body mass index is 21.51 kg/m.  General: A&O x 3, WDWN, petite female, moon facies. Gait: mild limp HENT: No gross abnormalities  Eyes: PERRLA. Pulmonary: Respirations are non labored, CTAB, good air movement in all fields  Cardiac: regular rhythm with occasional to frequent premature contractions, no detected murmur.         Carotid Bruits Right Left   Negative Negative   Radial pulses are 1+ palpable bilaterally   Adominal aortic pulse is not palpable                         VASCULAR EXAM: Extremities without ischemic changes, without Gangrene; without open wounds. Right ankle with scar tissue and mild depression malformation with darkened discoloration, limited ROM of right ankle. All toes of both feet are pink and warm with brisk capillary refill.  LE Pulses Right Left       FEMORAL  1+ palpable  1+ palpable        POPLITEAL  not palpable   not palpable       POSTERIOR TIBIAL  not palpable   not palpable        DORSALIS PEDIS      ANTERIOR TIBIAL 1+ palpable  not palpable    Abdomen: soft, NT, normal pitched bowel sounds, no palpable masses. Skin: no rashes, no ulcers noted. See Extremities.  Musculoskeletal: no muscle wasting or atrophy. See Extremities.  Neurologic: A&O X 3; appropriate affect, Sensation is normal; MOTOR FUNCTION:  moving all extremities equally, motor strength 5/5 throughout. Speech is fluent/normal. CN 2-12 intact. Psychiatric: Thought content is normal, mood appropriate for clinical situation.     ASSESSMENT: Kendra Walker is a 67 y.o. female who is s/p right femoral-popliteal bypass graft for infection/ulcer in 2008 in [place].  There are no signs of ischemia in her feet or legs.   She has equal bilateral calf pain with walking about 50 feet, this is not life limiting for her.   DATA  Right LE Arterial Duplex (04-04-17): Right: Outflow artery velocities are consistent with a 50-74% stenosis. 252 cm/s at outflow.  Tri and biphasic waveforms.  No significant change compared to previous study on 07-03-16. Right Graft(s): Fem-Pop bypass graft patent with no evidence of stenosis noted   ABI (Date: 04-04-17):  R:   ABI: 1.5 (was 1.5 on 09-18-16),   PT: bi  DP: bi  TBI:  0.65 (was 0.81)  L:   ABI: 1.07 (was 1.1),   PT: tri  DP: tri  TBI: 0.85 (was 0.97)  Stable right ABI with [place] vessels, biphasic waveforms, mild decline in right TBI.  Stable and normal left ABI with triphasic waveforms, slight decline on left TBI, yet normal.     PLAN:  Daily seated leg exercises discussed since her right ankle has limited ROM and she walks with a limp.  Based on the patient's vascular studies and examination, pt will return  to clinic in 9 months with right LE arterial duplex and ABI's,  I advised her to notify us if she develops concerns re the circulation in her feet or legs; will need ABI's and right LE arterial duplex if she returns sooner.    I discussed in depth with the patient the nature of atherosclerosis, and emphasized the importance of maximal medical management including strict control of blood pressure, blood glucose, and lipid levels, obtaining regular exercise, and continued cessation of smoking.  The patient is aware that without maximal medical management the underlying atherosclerotic disease process will progress, limiting the benefit of any interventions.  The patient was given information about PAD including signs, symptoms, treatment, what symptoms should prompt the patient to seek immediate medical care, and risk reduction measures to take.  Clemon Chambers, RN, MSN, FNP-C [facility] Phone: [phone]  Clinic MD: Trula Slade  04/08/17 12:59 PM

## 2017-04-08 NOTE — Patient Instructions (Signed)

## 2017-04-19 ENCOUNTER — Encounter: Payer: Self-pay | Admitting: Family Medicine

## 2017-05-24 DIAGNOSIS — B351 Tinea unguium: Secondary | ICD-10-CM | POA: Diagnosis not present

## 2017-05-24 DIAGNOSIS — E1342 Other specified diabetes mellitus with diabetic polyneuropathy: Secondary | ICD-10-CM | POA: Diagnosis not present

## 2017-05-24 DIAGNOSIS — L851 Acquired keratosis [keratoderma] palmaris et plantaris: Secondary | ICD-10-CM | POA: Diagnosis not present

## 2017-05-29 DIAGNOSIS — I5032 Chronic diastolic (congestive) heart failure: Secondary | ICD-10-CM | POA: Diagnosis not present

## 2017-05-29 DIAGNOSIS — I739 Peripheral vascular disease, unspecified: Secondary | ICD-10-CM | POA: Diagnosis not present

## 2017-05-29 DIAGNOSIS — I10 Essential (primary) hypertension: Secondary | ICD-10-CM | POA: Diagnosis not present

## 2017-05-29 DIAGNOSIS — N183 Chronic kidney disease, stage 3 (moderate): Secondary | ICD-10-CM | POA: Diagnosis not present

## 2017-05-29 DIAGNOSIS — D631 Anemia in chronic kidney disease: Secondary | ICD-10-CM | POA: Diagnosis not present

## 2017-05-29 DIAGNOSIS — E1121 Type 2 diabetes mellitus with diabetic nephropathy: Secondary | ICD-10-CM | POA: Diagnosis not present

## 2017-05-29 DIAGNOSIS — R5383 Other fatigue: Secondary | ICD-10-CM | POA: Diagnosis not present

## 2017-05-29 DIAGNOSIS — E039 Hypothyroidism, unspecified: Secondary | ICD-10-CM | POA: Diagnosis not present

## 2017-05-29 DIAGNOSIS — E782 Mixed hyperlipidemia: Secondary | ICD-10-CM | POA: Diagnosis not present

## 2017-07-01 ENCOUNTER — Encounter: Payer: Self-pay | Admitting: *Deleted

## 2017-07-01 ENCOUNTER — Ambulatory Visit (INDEPENDENT_AMBULATORY_CARE_PROVIDER_SITE_OTHER): Payer: Medicare Other | Admitting: Cardiology

## 2017-07-01 ENCOUNTER — Encounter: Payer: Self-pay | Admitting: Cardiology

## 2017-07-01 ENCOUNTER — Other Ambulatory Visit: Payer: Self-pay

## 2017-07-01 VITALS — BP 135/72 | HR 68 | Ht 61.0 in | Wt 121.0 lb

## 2017-07-01 DIAGNOSIS — I779 Disorder of arteries and arterioles, unspecified: Secondary | ICD-10-CM

## 2017-07-01 DIAGNOSIS — I739 Peripheral vascular disease, unspecified: Secondary | ICD-10-CM | POA: Diagnosis not present

## 2017-07-01 DIAGNOSIS — E782 Mixed hyperlipidemia: Secondary | ICD-10-CM | POA: Diagnosis not present

## 2017-07-01 DIAGNOSIS — I5032 Chronic diastolic (congestive) heart failure: Secondary | ICD-10-CM

## 2017-07-01 DIAGNOSIS — I2510 Atherosclerotic heart disease of native coronary artery without angina pectoris: Secondary | ICD-10-CM | POA: Diagnosis not present

## 2017-07-01 NOTE — Progress Notes (Signed)
Clinical Summary Ms. Morreale is a 67 y.o.female seen today for follow up of the following medical problems.   1. CAD - previously followed by [facility]. 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  - LVEF March 2011 reported normal LVEF. Echo 04/2011 LVEF 55-60%. - 12/2014 Lexiscan without ischemia - 08/2016 echo LVEF 60-65%, grade II diastolic dysfunction     - no recent chest pain. Breathing mildly improved, still with symptoms at higher levels.  - second hand smoke exposure. Not interested in PFTs, husband with ongoing should issuess  2. PAD - hx of right common femoral to popliteal bypass in [place] - followed by vascular  - no recent symptoms   3. 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 Adrenal insufficiency - followed by endocrine  6.HTN -compliant with meds  7. CKD - followed at [facility]  8. Chronic diastolic HF - echo 02/9474 LVEF 60-65%, grade II diastolic dysfunction. Mild to moderate RV dysfunction. PASP 23.  - chronic mild SOB overall unchanged    Past Medical History:  Diagnosis Date  . Cancer (HCC)    Squamous Cell  . Diabetes mellitus without complication ([place])   . Hyperlipemia   . Hypertension   . Hypothyroidism      Allergies  Allergen Reactions  . Bactrim [Sulfamethoxazole-Trimethoprim] Other (See Comments)    Elevates potassium.   . Tape Other (See Comments)    not allergic but causes sensitivity.  . Fluorescein Rash  . Procrit [Epoetin (Alfa)] Rash     Current Outpatient Medications  Medication Sig Dispense Refill  . acetaminophen (TYLENOL) 500 MG tablet Take 500 mg by mouth daily as needed for mild pain or headache.    . Ascorbic Acid (VITAMIN C PO) Take 1 tablet by mouth daily.    Marland Kitchen aspirin EC 81 MG tablet Take 81 mg by mouth daily.    Marland Kitchen atorvastatin (LIPITOR) 80  MG tablet Take 1 tablet (80 mg total) by mouth daily. 30 tablet 6  . cholecalciferol (VITAMIN D) 1000 UNITS tablet Take 1,000 Units by mouth daily.    . fludrocortisone (FLORINEF) 0.1 MG tablet Take 0.5 tablets by mouth daily.    Marland Kitchen HUMALOG 100 UNIT/ML injection as directed.    . iron polysaccharides (FERREX 150) 150 MG capsule Take 150 mg by mouth daily.    Marland Kitchen levothyroxine (SYNTHROID, LEVOTHROID) 100 MCG tablet Take 1 tablet by mouth daily.    . magnesium chloride (SLOW-MAG) 64 MG TBEC SR tablet Take 1 tablet by mouth 3 (three) times daily.     . Multiple Vitamin (MULTIVITAMIN WITH MINERALS) TABS tablet Take 1 tablet by mouth daily.    . Multiple Vitamins-Minerals (PRESERVISION AREDS 2 PO) Take 1 tablet by mouth daily.     . Omega-3 Fatty Acids (FISH OIL) 1000 MG CAPS Take 1 capsule by mouth daily.    . Patiromer Sorbitex Calcium (VELTASSA PO) Take by mouth 2 (two) times a week. Packet    . predniSONE (DELTASONE) 5 MG tablet Take 5 mg by mouth daily with breakfast.    . SODIUM BICARBONATE PO Take by mouth daily. 10 gram    . VELTASSA 8.4 g packet MIX 8.4 GRAM IN ONE GLASS OF WATER AND DRINK EVERY MONDAY, [place]  5   No current facility-administered medications for this visit.      Past Surgical History:  Procedure Laterality Date  . APPENDECTOMY    . CATARACT EXTRACTION Bilateral   . CORONARY ARTERY BYPASS GRAFT    . TONSILLECTOMY    . YAG LASER APPLICATION Right 8/85/0277   Procedure: YAG LASER APPLICATION;  Surgeon: Williams Che, MD;  Location: [facility];  Service: Ophthalmology;  Laterality: Right;     Allergies  Allergen Reactions  . Bactrim [Sulfamethoxazole-Trimethoprim] Other (See Comments)    Elevates potassium.   . Tape Other (See Comments)    not allergic but causes sensitivity.  . Fluorescein Rash  . Procrit [Epoetin (Alfa)] Rash      Family History  Problem Relation Age of Onset  . Heart  disease Mother   . Hypertension Mother   . Hypertension Father      Social History Ms. Hanover reports that she has never smoked. She has never used smokeless tobacco. Ms. Perot reports that she does not drink alcohol.   Review of Systems CONSTITUTIONAL: No weight loss, fever, chills, weakness or fatigue.  HEENT: Eyes: No visual loss, blurred vision, double vision or yellow sclerae.No hearing loss, sneezing, congestion, runny nose or sore throat.  SKIN: No rash or itching.  CARDIOVASCULAR: per hpi RESPIRATORY: per hpi  GASTROINTESTINAL: No anorexia, nausea, vomiting or diarrhea. No abdominal pain or blood.  GENITOURINARY: No burning on urination, no polyuria NEUROLOGICAL: No headache, dizziness, syncope, paralysis, ataxia, numbness or tingling in the extremities. No change in bowel or bladder control.  MUSCULOSKELETAL: No muscle, back pain, joint pain or stiffness.  LYMPHATICS: No enlarged nodes. No history of splenectomy.  PSYCHIATRIC: No history of depression or anxiety.  ENDOCRINOLOGIC: No reports of sweating, cold or heat intolerance. No polyuria or polydipsia.  Marland Kitchen   Physical Examination Vitals:   07/01/17 1029  BP: 135/72  Pulse: 68  SpO2: 97%   Vitals:   07/01/17 1029  Weight: 121 lb (54.9 kg)  Height: 5\' 1"  (1.549 m)    Gen: resting comfortably, no acute distress HEENT: no scleral icterus, pupils equal round and reactive, no palptable cervical adenopathy,  CV: RRR, 2/6 systolic murmur rusb, no jvd Resp: Clear to auscultation bilaterally GI: abdomen is soft, non-tender, non-distended, normal bowel sounds, no hepatosplenomegaly MSK: extremities are warm, no edema.  Skin: warm, no rash Neuro:  no focal deficits Psych: appropriate affect   Diagnostic Studies 04/2011 Echo FINDINGS:  LEFT VENTRICLE The left ventricular size is normal. There is normal left ventricular wall  thickness. Left ventricular systolic function is normal. LV ejection fraction  = 55-60%.  Left ventricular filling pattern is indeterminate. The left  ventricular wall motion is normal. LV WALL MOTION -  RIGHT VENTRICLE The right ventricle is normal in size and function. The right ventricular  systolic function is normal. The right ventricular wall motion is normal.  LEFT ATRIUM The left atrium is borderline dilated.  RIGHT ATRIUM  Right atrial size is normal. - AORTIC VALVE The aortic valve is normal in structure and function. The aortic valve opens  well. Trace (trivial) aortic regurgitation. - MITRAL VALVE The mitral valve leaflets appear normal. There is no evidence of stenosis,  fluttering, or prolapse. There is mild mitral regurgitation. - TRICUSPID VALVE Structurally normal tricuspid valve. There is mild tricuspid regurgitation. - PULMONIC VALVE The pulmonic valve is normal in structure and function. Trace pulmonic  valvular regurgitation. - ARTERIES The aortic root is normal size. - VENOUS  Pulmonary venous flow pattern is blunted. IVC size was normal. - EFFUSION There is no pericardial effusion. - - MMode/2D Measurements &Calculations IVSd: 0.95 cm LVIDd: 3.9 cm LVPWd: 0.99 cm LVIDs: 2.4 cm LA dim: 3.8 cm Ao root: 2.5 cm EDV(MOD-sp4): 24.0 ml ESV(MOD-sp4): 11.0 ml LVOT diam: 1.5 cm LAV(MOD-sp4): 41.5 ml Doppler Measurements &Calculations MV E max vel: 114.0 cm/sec MV A max vel: 82.4 cm/sec MV E/A: 1.4 Med Peak E' Vel: 7.6 cm/sec Lat Peak E' Vel: 12.2 cm/sec E/Lat E`: 9.4 E/Med E`: 15.0 MV dec time: 0.16 sec SV(LVOT): 45.4 ml Ao mean PG: 7.1 mmHg AVA (VTI): 1.3 cm2 LV V1 VTI: 27.2 cm TR max vel: 230.8 cm/sec TR max PG: 21.3 mmHg RVSP(TR): 26.3 mmHg RAP systole: 5.0 mmHg  11/2014 echo Study Conclusions  - Left ventricle: The cavity size was normal. Systolic function was normal. The estimated ejection fraction was in the range of 60% to 65%. Wall motion was normal; there were no regional wall motion abnormalities.  Diastolic dysfunction, grade indeterminate. Medial annular velocity is slightly low with elevated E/e&' ratio of 18, indicative of elevated filling pressures. Mild to moderate concentric left ventricular hypertrophy. - Aortic valve: Moderately calcified annulus. Trileaflet. There was mild regurgitation. - Mitral valve: Mildly calcified annulus. Mildly thickened leaflets . There was trivial regurgitation. - Right ventricle: Systolic function was normal. TAPSE: 17.6 mm . - Tricuspid valve: There was mild-moderate regurgitation. - Pulmonary arteries: Systolic pressure was mildly increased. PA peak pressure: 35 mm Hg (S).   12/2014 Lexiscan MPI  No diagnostic ST segment abnormalities. Rare PACs.  Very small fixed perfusion defect at the inferior apex most consistent with attenuation artifact rather than scar. No ischemic findings.  This is a low risk study.  Nuclear stress EF: 70%.  08/2016 echo Study Conclusions  - Left ventricle: The cavity size was normal. Wall thickness was normal. Systolic function was normal. The estimated ejection fraction was in the range of 60% to 65%. Features are consistent with a pseudonormal left ventricular filling pattern, with concomitant abnormal relaxation and increased filling pressure (grade 2 diastolic dysfunction). Doppler parameters are consistent with high ventricular filling pressure. - Aortic valve: Moderately calcified annulus. Trileaflet. There was mild regurgitation. - Mitral valve: Calcified annulus. There was mild regurgitation. - Right ventricle: Systolic function was mildly to moderately reduced.      Assessment and Plan  1. CAD -no recent chest pain, continue current meds  2. PAD -she will f/u with vascular - continue current medical therapy  3. Hyperlipidemia - request labs from pcp, continue staitn   4. HTN - . Due to adrenal insufficiency prior issues have been more related  to hypotension - bp overall at goal, continue current meds  5. Chronic diastolic HF - euvolemic, continue current therapy     F/u 6 months       Arnoldo Lenis, M.D.

## 2017-07-01 NOTE — Patient Instructions (Signed)

## 2017-07-04 DIAGNOSIS — E10649 Type 1 diabetes mellitus with hypoglycemia without coma: Secondary | ICD-10-CM | POA: Diagnosis not present

## 2017-07-04 DIAGNOSIS — E039 Hypothyroidism, unspecified: Secondary | ICD-10-CM | POA: Diagnosis not present

## 2017-07-04 DIAGNOSIS — E063 Autoimmune thyroiditis: Secondary | ICD-10-CM | POA: Diagnosis not present

## 2017-07-04 DIAGNOSIS — Z79899 Other long term (current) drug therapy: Secondary | ICD-10-CM | POA: Diagnosis not present

## 2017-07-04 DIAGNOSIS — Z7952 Long term (current) use of systemic steroids: Secondary | ICD-10-CM | POA: Diagnosis not present

## 2017-07-04 DIAGNOSIS — Z7982 Long term (current) use of aspirin: Secondary | ICD-10-CM | POA: Diagnosis not present

## 2017-07-04 DIAGNOSIS — E1065 Type 1 diabetes mellitus with hyperglycemia: Secondary | ICD-10-CM | POA: Diagnosis not present

## 2017-07-04 DIAGNOSIS — Z9641 Presence of insulin pump (external) (internal): Secondary | ICD-10-CM | POA: Diagnosis not present

## 2017-07-04 DIAGNOSIS — E271 Primary adrenocortical insufficiency: Secondary | ICD-10-CM | POA: Diagnosis not present

## 2017-07-04 DIAGNOSIS — I129 Hypertensive chronic kidney disease with stage 1 through stage 4 chronic kidney disease, or unspecified chronic kidney disease: Secondary | ICD-10-CM | POA: Diagnosis not present

## 2017-07-04 DIAGNOSIS — E1022 Type 1 diabetes mellitus with diabetic chronic kidney disease: Secondary | ICD-10-CM | POA: Diagnosis not present

## 2017-07-04 DIAGNOSIS — N183 Chronic kidney disease, stage 3 (moderate): Secondary | ICD-10-CM | POA: Diagnosis not present

## 2017-07-05 ENCOUNTER — Encounter: Payer: Self-pay | Admitting: Cardiology

## 2017-07-15 DIAGNOSIS — Z1231 Encounter for screening mammogram for malignant neoplasm of breast: Secondary | ICD-10-CM | POA: Diagnosis not present

## 2017-07-30 DIAGNOSIS — H01025 Squamous blepharitis left lower eyelid: Secondary | ICD-10-CM | POA: Diagnosis not present

## 2017-07-30 DIAGNOSIS — Z961 Presence of intraocular lens: Secondary | ICD-10-CM | POA: Diagnosis not present

## 2017-07-30 DIAGNOSIS — H01024 Squamous blepharitis left upper eyelid: Secondary | ICD-10-CM | POA: Diagnosis not present

## 2017-07-30 DIAGNOSIS — H01022 Squamous blepharitis right lower eyelid: Secondary | ICD-10-CM | POA: Diagnosis not present

## 2017-07-30 DIAGNOSIS — H01021 Squamous blepharitis right upper eyelid: Secondary | ICD-10-CM | POA: Diagnosis not present

## 2017-07-30 DIAGNOSIS — H353132 Nonexudative age-related macular degeneration, bilateral, intermediate dry stage: Secondary | ICD-10-CM | POA: Diagnosis not present

## 2017-07-30 DIAGNOSIS — H04123 Dry eye syndrome of bilateral lacrimal glands: Secondary | ICD-10-CM | POA: Diagnosis not present

## 2017-07-30 DIAGNOSIS — E113593 Type 2 diabetes mellitus with proliferative diabetic retinopathy without macular edema, bilateral: Secondary | ICD-10-CM | POA: Diagnosis not present

## 2017-08-01 DIAGNOSIS — E1022 Type 1 diabetes mellitus with diabetic chronic kidney disease: Secondary | ICD-10-CM | POA: Diagnosis not present

## 2017-08-01 DIAGNOSIS — Z7952 Long term (current) use of systemic steroids: Secondary | ICD-10-CM | POA: Diagnosis not present

## 2017-08-01 DIAGNOSIS — I1310 Hypertensive heart and chronic kidney disease without heart failure, with stage 1 through stage 4 chronic kidney disease, or unspecified chronic kidney disease: Secondary | ICD-10-CM | POA: Diagnosis not present

## 2017-08-01 DIAGNOSIS — Z9641 Presence of insulin pump (external) (internal): Secondary | ICD-10-CM | POA: Diagnosis not present

## 2017-08-01 DIAGNOSIS — Z79899 Other long term (current) drug therapy: Secondary | ICD-10-CM | POA: Diagnosis not present

## 2017-08-01 DIAGNOSIS — E271 Primary adrenocortical insufficiency: Secondary | ICD-10-CM | POA: Diagnosis not present

## 2017-08-01 DIAGNOSIS — Z8679 Personal history of other diseases of the circulatory system: Secondary | ICD-10-CM | POA: Diagnosis not present

## 2017-08-01 DIAGNOSIS — Z7982 Long term (current) use of aspirin: Secondary | ICD-10-CM | POA: Diagnosis not present

## 2017-08-01 DIAGNOSIS — Z8639 Personal history of other endocrine, nutritional and metabolic disease: Secondary | ICD-10-CM | POA: Diagnosis not present

## 2017-08-01 DIAGNOSIS — D631 Anemia in chronic kidney disease: Secondary | ICD-10-CM | POA: Diagnosis not present

## 2017-08-01 DIAGNOSIS — N183 Chronic kidney disease, stage 3 (moderate): Secondary | ICD-10-CM | POA: Diagnosis not present

## 2017-08-01 DIAGNOSIS — E063 Autoimmune thyroiditis: Secondary | ICD-10-CM | POA: Diagnosis not present

## 2017-08-01 DIAGNOSIS — Z951 Presence of aortocoronary bypass graft: Secondary | ICD-10-CM | POA: Diagnosis not present

## 2017-08-02 DIAGNOSIS — E1342 Other specified diabetes mellitus with diabetic polyneuropathy: Secondary | ICD-10-CM | POA: Diagnosis not present

## 2017-08-02 DIAGNOSIS — B351 Tinea unguium: Secondary | ICD-10-CM | POA: Diagnosis not present

## 2017-08-02 DIAGNOSIS — L851 Acquired keratosis [keratoderma] palmaris et plantaris: Secondary | ICD-10-CM | POA: Diagnosis not present

## 2017-08-29 DIAGNOSIS — H353133 Nonexudative age-related macular degeneration, bilateral, advanced atrophic without subfoveal involvement: Secondary | ICD-10-CM | POA: Diagnosis not present

## 2017-08-29 DIAGNOSIS — E113593 Type 2 diabetes mellitus with proliferative diabetic retinopathy without macular edema, bilateral: Secondary | ICD-10-CM | POA: Diagnosis not present

## 2017-08-29 DIAGNOSIS — H35373 Puckering of macula, bilateral: Secondary | ICD-10-CM | POA: Diagnosis not present

## 2017-08-29 DIAGNOSIS — H353222 Exudative age-related macular degeneration, left eye, with inactive choroidal neovascularization: Secondary | ICD-10-CM | POA: Diagnosis not present

## 2017-08-29 DIAGNOSIS — H31009 Unspecified chorioretinal scars, unspecified eye: Secondary | ICD-10-CM | POA: Diagnosis not present

## 2017-09-16 DIAGNOSIS — Z1211 Encounter for screening for malignant neoplasm of colon: Secondary | ICD-10-CM | POA: Diagnosis not present

## 2017-09-30 DIAGNOSIS — R5383 Other fatigue: Secondary | ICD-10-CM | POA: Diagnosis not present

## 2017-09-30 DIAGNOSIS — E782 Mixed hyperlipidemia: Secondary | ICD-10-CM | POA: Diagnosis not present

## 2017-09-30 DIAGNOSIS — D631 Anemia in chronic kidney disease: Secondary | ICD-10-CM | POA: Diagnosis not present

## 2017-09-30 DIAGNOSIS — D529 Folate deficiency anemia, unspecified: Secondary | ICD-10-CM | POA: Diagnosis not present

## 2017-09-30 DIAGNOSIS — E1121 Type 2 diabetes mellitus with diabetic nephropathy: Secondary | ICD-10-CM | POA: Diagnosis not present

## 2017-09-30 DIAGNOSIS — E039 Hypothyroidism, unspecified: Secondary | ICD-10-CM | POA: Diagnosis not present

## 2017-09-30 DIAGNOSIS — N183 Chronic kidney disease, stage 3 (moderate): Secondary | ICD-10-CM | POA: Diagnosis not present

## 2017-09-30 DIAGNOSIS — I10 Essential (primary) hypertension: Secondary | ICD-10-CM | POA: Diagnosis not present

## 2017-09-30 DIAGNOSIS — D519 Vitamin B12 deficiency anemia, unspecified: Secondary | ICD-10-CM | POA: Diagnosis not present

## 2017-10-02 DIAGNOSIS — Z1389 Encounter for screening for other disorder: Secondary | ICD-10-CM | POA: Diagnosis not present

## 2017-10-02 DIAGNOSIS — E782 Mixed hyperlipidemia: Secondary | ICD-10-CM | POA: Diagnosis not present

## 2017-10-02 DIAGNOSIS — Z6821 Body mass index (BMI) 21.0-21.9, adult: Secondary | ICD-10-CM | POA: Diagnosis not present

## 2017-10-02 DIAGNOSIS — J8410 Pulmonary fibrosis, unspecified: Secondary | ICD-10-CM | POA: Diagnosis not present

## 2017-10-02 DIAGNOSIS — Z1331 Encounter for screening for depression: Secondary | ICD-10-CM | POA: Diagnosis not present

## 2017-10-02 DIAGNOSIS — I10 Essential (primary) hypertension: Secondary | ICD-10-CM | POA: Diagnosis not present

## 2017-10-02 DIAGNOSIS — E1021 Type 1 diabetes mellitus with diabetic nephropathy: Secondary | ICD-10-CM | POA: Diagnosis not present

## 2017-10-02 DIAGNOSIS — D631 Anemia in chronic kidney disease: Secondary | ICD-10-CM | POA: Diagnosis not present

## 2017-10-03 DIAGNOSIS — Z7952 Long term (current) use of systemic steroids: Secondary | ICD-10-CM | POA: Diagnosis not present

## 2017-10-03 DIAGNOSIS — E109 Type 1 diabetes mellitus without complications: Secondary | ICD-10-CM | POA: Diagnosis not present

## 2017-10-03 DIAGNOSIS — Z79899 Other long term (current) drug therapy: Secondary | ICD-10-CM | POA: Diagnosis not present

## 2017-10-03 DIAGNOSIS — Z7982 Long term (current) use of aspirin: Secondary | ICD-10-CM | POA: Diagnosis not present

## 2017-10-03 DIAGNOSIS — I10 Essential (primary) hypertension: Secondary | ICD-10-CM | POA: Diagnosis not present

## 2017-10-03 DIAGNOSIS — E039 Hypothyroidism, unspecified: Secondary | ICD-10-CM | POA: Diagnosis not present

## 2017-10-03 DIAGNOSIS — Z9641 Presence of insulin pump (external) (internal): Secondary | ICD-10-CM | POA: Diagnosis not present

## 2017-10-03 DIAGNOSIS — E271 Primary adrenocortical insufficiency: Secondary | ICD-10-CM | POA: Diagnosis not present

## 2017-10-04 DIAGNOSIS — I700 Atherosclerosis of aorta: Secondary | ICD-10-CM | POA: Diagnosis not present

## 2017-10-04 DIAGNOSIS — R918 Other nonspecific abnormal finding of lung field: Secondary | ICD-10-CM | POA: Diagnosis not present

## 2017-10-04 DIAGNOSIS — R0602 Shortness of breath: Secondary | ICD-10-CM | POA: Diagnosis not present

## 2017-10-04 DIAGNOSIS — K769 Liver disease, unspecified: Secondary | ICD-10-CM | POA: Diagnosis not present

## 2017-10-11 DIAGNOSIS — E1342 Other specified diabetes mellitus with diabetic polyneuropathy: Secondary | ICD-10-CM | POA: Diagnosis not present

## 2017-10-11 DIAGNOSIS — B351 Tinea unguium: Secondary | ICD-10-CM | POA: Diagnosis not present

## 2017-10-11 DIAGNOSIS — L851 Acquired keratosis [keratoderma] palmaris et plantaris: Secondary | ICD-10-CM | POA: Diagnosis not present

## 2017-10-29 DIAGNOSIS — Z23 Encounter for immunization: Secondary | ICD-10-CM | POA: Diagnosis not present

## 2017-11-21 DIAGNOSIS — K644 Residual hemorrhoidal skin tags: Secondary | ICD-10-CM | POA: Diagnosis not present

## 2017-11-21 DIAGNOSIS — K6289 Other specified diseases of anus and rectum: Secondary | ICD-10-CM | POA: Diagnosis not present

## 2017-11-21 DIAGNOSIS — E271 Primary adrenocortical insufficiency: Secondary | ICD-10-CM | POA: Diagnosis not present

## 2017-11-21 DIAGNOSIS — Z9641 Presence of insulin pump (external) (internal): Secondary | ICD-10-CM | POA: Diagnosis not present

## 2017-11-21 DIAGNOSIS — I739 Peripheral vascular disease, unspecified: Secondary | ICD-10-CM | POA: Diagnosis not present

## 2017-11-21 DIAGNOSIS — Z888 Allergy status to other drugs, medicaments and biological substances status: Secondary | ICD-10-CM | POA: Diagnosis not present

## 2017-11-21 DIAGNOSIS — Z1211 Encounter for screening for malignant neoplasm of colon: Secondary | ICD-10-CM | POA: Diagnosis not present

## 2017-11-21 DIAGNOSIS — I129 Hypertensive chronic kidney disease with stage 1 through stage 4 chronic kidney disease, or unspecified chronic kidney disease: Secondary | ICD-10-CM | POA: Diagnosis not present

## 2017-11-21 DIAGNOSIS — K598 Other specified functional intestinal disorders: Secondary | ICD-10-CM | POA: Diagnosis not present

## 2017-11-21 DIAGNOSIS — I2510 Atherosclerotic heart disease of native coronary artery without angina pectoris: Secondary | ICD-10-CM | POA: Diagnosis not present

## 2017-11-21 DIAGNOSIS — Q438 Other specified congenital malformations of intestine: Secondary | ICD-10-CM | POA: Diagnosis not present

## 2017-11-21 DIAGNOSIS — I252 Old myocardial infarction: Secondary | ICD-10-CM | POA: Diagnosis not present

## 2017-11-21 DIAGNOSIS — N189 Chronic kidney disease, unspecified: Secondary | ICD-10-CM | POA: Diagnosis not present

## 2017-11-21 DIAGNOSIS — Z883 Allergy status to other anti-infective agents status: Secondary | ICD-10-CM | POA: Diagnosis not present

## 2017-11-21 DIAGNOSIS — Z86718 Personal history of other venous thrombosis and embolism: Secondary | ICD-10-CM | POA: Diagnosis not present

## 2017-11-21 DIAGNOSIS — E1122 Type 2 diabetes mellitus with diabetic chronic kidney disease: Secondary | ICD-10-CM | POA: Diagnosis not present

## 2017-11-21 DIAGNOSIS — E7800 Pure hypercholesterolemia, unspecified: Secondary | ICD-10-CM | POA: Diagnosis not present

## 2017-12-23 ENCOUNTER — Encounter (HOSPITAL_COMMUNITY): Payer: Medicare Other

## 2017-12-23 ENCOUNTER — Ambulatory Visit: Payer: Medicare Other | Admitting: Family

## 2017-12-23 ENCOUNTER — Other Ambulatory Visit (HOSPITAL_COMMUNITY): Payer: Medicare Other

## 2018-01-02 DIAGNOSIS — E039 Hypothyroidism, unspecified: Secondary | ICD-10-CM | POA: Diagnosis not present

## 2018-01-02 DIAGNOSIS — Z9641 Presence of insulin pump (external) (internal): Secondary | ICD-10-CM | POA: Diagnosis not present

## 2018-01-02 DIAGNOSIS — N183 Chronic kidney disease, stage 3 (moderate): Secondary | ICD-10-CM | POA: Diagnosis not present

## 2018-01-02 DIAGNOSIS — E271 Primary adrenocortical insufficiency: Secondary | ICD-10-CM | POA: Diagnosis not present

## 2018-01-02 DIAGNOSIS — E1022 Type 1 diabetes mellitus with diabetic chronic kidney disease: Secondary | ICD-10-CM | POA: Diagnosis not present

## 2018-01-02 DIAGNOSIS — I10 Essential (primary) hypertension: Secondary | ICD-10-CM | POA: Diagnosis not present

## 2018-01-02 DIAGNOSIS — Z7952 Long term (current) use of systemic steroids: Secondary | ICD-10-CM | POA: Diagnosis not present

## 2018-01-02 DIAGNOSIS — E10649 Type 1 diabetes mellitus with hypoglycemia without coma: Secondary | ICD-10-CM | POA: Diagnosis not present

## 2018-01-13 DIAGNOSIS — N184 Chronic kidney disease, stage 4 (severe): Secondary | ICD-10-CM | POA: Diagnosis not present

## 2018-01-13 DIAGNOSIS — E039 Hypothyroidism, unspecified: Secondary | ICD-10-CM | POA: Diagnosis not present

## 2018-01-13 DIAGNOSIS — E782 Mixed hyperlipidemia: Secondary | ICD-10-CM | POA: Diagnosis not present

## 2018-01-13 DIAGNOSIS — E1121 Type 2 diabetes mellitus with diabetic nephropathy: Secondary | ICD-10-CM | POA: Diagnosis not present

## 2018-01-13 DIAGNOSIS — E1169 Type 2 diabetes mellitus with other specified complication: Secondary | ICD-10-CM | POA: Diagnosis not present

## 2018-01-17 DIAGNOSIS — N184 Chronic kidney disease, stage 4 (severe): Secondary | ICD-10-CM | POA: Diagnosis not present

## 2018-01-17 DIAGNOSIS — I10 Essential (primary) hypertension: Secondary | ICD-10-CM | POA: Diagnosis not present

## 2018-01-17 DIAGNOSIS — D631 Anemia in chronic kidney disease: Secondary | ICD-10-CM | POA: Diagnosis not present

## 2018-01-17 DIAGNOSIS — H3530 Unspecified macular degeneration: Secondary | ICD-10-CM | POA: Diagnosis not present

## 2018-01-17 DIAGNOSIS — I739 Peripheral vascular disease, unspecified: Secondary | ICD-10-CM | POA: Diagnosis not present

## 2018-01-17 DIAGNOSIS — Z6822 Body mass index (BMI) 22.0-22.9, adult: Secondary | ICD-10-CM | POA: Diagnosis not present

## 2018-01-17 DIAGNOSIS — E039 Hypothyroidism, unspecified: Secondary | ICD-10-CM | POA: Diagnosis not present

## 2018-01-17 DIAGNOSIS — J8410 Pulmonary fibrosis, unspecified: Secondary | ICD-10-CM | POA: Diagnosis not present

## 2018-01-17 DIAGNOSIS — E1021 Type 1 diabetes mellitus with diabetic nephropathy: Secondary | ICD-10-CM | POA: Diagnosis not present

## 2018-01-17 DIAGNOSIS — Z23 Encounter for immunization: Secondary | ICD-10-CM | POA: Diagnosis not present

## 2018-01-17 DIAGNOSIS — E782 Mixed hyperlipidemia: Secondary | ICD-10-CM | POA: Diagnosis not present

## 2018-01-17 DIAGNOSIS — I5032 Chronic diastolic (congestive) heart failure: Secondary | ICD-10-CM | POA: Diagnosis not present

## 2018-01-17 DIAGNOSIS — E2749 Other adrenocortical insufficiency: Secondary | ICD-10-CM | POA: Diagnosis not present

## 2018-01-31 DIAGNOSIS — E1342 Other specified diabetes mellitus with diabetic polyneuropathy: Secondary | ICD-10-CM | POA: Diagnosis not present

## 2018-01-31 DIAGNOSIS — B351 Tinea unguium: Secondary | ICD-10-CM | POA: Diagnosis not present

## 2018-01-31 DIAGNOSIS — L851 Acquired keratosis [keratoderma] palmaris et plantaris: Secondary | ICD-10-CM | POA: Diagnosis not present

## 2018-02-17 DIAGNOSIS — Z6822 Body mass index (BMI) 22.0-22.9, adult: Secondary | ICD-10-CM | POA: Diagnosis not present

## 2018-02-17 DIAGNOSIS — R600 Localized edema: Secondary | ICD-10-CM | POA: Diagnosis not present

## 2018-02-21 ENCOUNTER — Other Ambulatory Visit (HOSPITAL_COMMUNITY): Payer: Medicare Other

## 2018-02-21 ENCOUNTER — Encounter (HOSPITAL_COMMUNITY): Payer: Medicare Other

## 2018-02-21 ENCOUNTER — Ambulatory Visit: Payer: Medicare Other | Admitting: Family

## 2018-04-03 DIAGNOSIS — I739 Peripheral vascular disease, unspecified: Secondary | ICD-10-CM | POA: Diagnosis not present

## 2018-04-03 DIAGNOSIS — M7989 Other specified soft tissue disorders: Secondary | ICD-10-CM | POA: Diagnosis not present

## 2018-04-03 DIAGNOSIS — E1022 Type 1 diabetes mellitus with diabetic chronic kidney disease: Secondary | ICD-10-CM | POA: Diagnosis not present

## 2018-04-03 DIAGNOSIS — N183 Chronic kidney disease, stage 3 (moderate): Secondary | ICD-10-CM | POA: Diagnosis not present

## 2018-04-03 DIAGNOSIS — E10649 Type 1 diabetes mellitus with hypoglycemia without coma: Secondary | ICD-10-CM | POA: Diagnosis not present

## 2018-04-03 DIAGNOSIS — R600 Localized edema: Secondary | ICD-10-CM | POA: Diagnosis not present

## 2018-04-03 DIAGNOSIS — Z95820 Peripheral vascular angioplasty status with implants and grafts: Secondary | ICD-10-CM | POA: Diagnosis not present

## 2018-04-03 DIAGNOSIS — E271 Primary adrenocortical insufficiency: Secondary | ICD-10-CM | POA: Diagnosis not present

## 2018-04-04 ENCOUNTER — Other Ambulatory Visit: Payer: Self-pay

## 2018-04-04 DIAGNOSIS — Z95828 Presence of other vascular implants and grafts: Secondary | ICD-10-CM

## 2018-04-04 DIAGNOSIS — E1051 Type 1 diabetes mellitus with diabetic peripheral angiopathy without gangrene: Secondary | ICD-10-CM

## 2018-04-04 DIAGNOSIS — I779 Disorder of arteries and arterioles, unspecified: Secondary | ICD-10-CM

## 2018-04-04 DIAGNOSIS — I739 Peripheral vascular disease, unspecified: Secondary | ICD-10-CM

## 2018-04-08 ENCOUNTER — Ambulatory Visit (HOSPITAL_COMMUNITY)
Admission: RE | Admit: 2018-04-08 | Discharge: 2018-04-08 | Disposition: A | Discharge status: Home / Self Care | Payer: Medicare Other | Source: Ambulatory Visit | Attending: Family | Admitting: Family

## 2018-04-08 ENCOUNTER — Ambulatory Visit (INDEPENDENT_AMBULATORY_CARE_PROVIDER_SITE_OTHER)
Admission: RE | Admit: 2018-04-08 | Discharge: 2018-04-08 | Disposition: A | Discharge status: Home / Self Care | Payer: Medicare Other | Source: Ambulatory Visit | Attending: Family | Admitting: Family

## 2018-04-08 ENCOUNTER — Ambulatory Visit: Payer: Medicare Other | Admitting: Family

## 2018-04-08 ENCOUNTER — Other Ambulatory Visit: Payer: Self-pay

## 2018-04-08 ENCOUNTER — Ambulatory Visit (INDEPENDENT_AMBULATORY_CARE_PROVIDER_SITE_OTHER): Payer: Medicare Other | Admitting: Family

## 2018-04-08 ENCOUNTER — Encounter: Payer: Self-pay | Admitting: Family

## 2018-04-08 VITALS — BP 137/57 | HR 69 | Temp 97.5°F | Resp 14 | Ht 62.0 in | Wt 125.0 lb

## 2018-04-08 DIAGNOSIS — I779 Disorder of arteries and arterioles, unspecified: Secondary | ICD-10-CM

## 2018-04-08 DIAGNOSIS — I739 Peripheral vascular disease, unspecified: Secondary | ICD-10-CM | POA: Diagnosis not present

## 2018-04-08 DIAGNOSIS — M79672 Pain in left foot: Secondary | ICD-10-CM

## 2018-04-08 DIAGNOSIS — E1051 Type 1 diabetes mellitus with diabetic peripheral angiopathy without gangrene: Secondary | ICD-10-CM | POA: Diagnosis not present

## 2018-04-08 DIAGNOSIS — E271 Primary adrenocortical insufficiency: Secondary | ICD-10-CM

## 2018-04-08 DIAGNOSIS — Z95828 Presence of other vascular implants and grafts: Secondary | ICD-10-CM | POA: Diagnosis not present

## 2018-04-08 DIAGNOSIS — M7989 Other specified soft tissue disorders: Secondary | ICD-10-CM

## 2018-04-08 NOTE — Progress Notes (Signed)
[facility] [place]   CC: Follow up peripheral artery occlusive disease  History of Present Illness Kendra Walker is a 68 y.o. female whohas a history of a right femoral to above-knee popliteal artery bypass graft with vein in 2008 in [place]. This was done for a nonhealing wound. The patient had a right ankle fracture with orthopedic hardware and became infected. Fortunately she was able to have all of this heal. She had not had any vascular follow-up since her surgery when Dr. Trula Slade saw her on 11-28-15.   She states that she gets cramping inboth calveswith walking approximately 50 feet, relieved by rest.  Dr. Trula Slade last evaluated pt on 11-28-15. At that time her right leg bypass graft was widely patent with no evidence of stenosis.   Her walking is also limited by dyspnea, states her cardiologist is aware, Dr. Harl Bowie.She denies rest pain or open wounds. She also states that her cardiologist is aware of occasional premature contractions of her cardiac rhythm.   The patient suffers from coronary artery disease. 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 She has a Lexi-scan from 2016 without ischemia. She suffers from hypercholesterolemia which is treated with a statin.  She has a history of chronic renal insufficiency.09-04-16 serum creatinine was 1.63 (review of records). Her nephrologist is connected with [facility]. She also suffers from Addison's disease.  She denies any hx of stroke or TIA.  Pt reports that she had a venous duplex of her right leg at [facility] about April 01, 2018 to evaluate for DVT, pt states no DVT found. She has had right lower leg and right foot swelling since the end of January 2020, since she has been caring for her husband after his shoulder surgery. Pt states overnight elevation decreases, but does not resolve swelling. She sees a podiatrist every 10 weeks.  She has a new puppy, walks with her  frequently.    Diabetic: Yes, states her last A1C was 7.3.  Her endocrinologist is in the [place] at [facility]. She was diagnosed with DM in her mid teens. She uses an insulin pump.  Tobacco use: non-smoker  Pt meds include: Statin :Yes Betablocker: No ASA: Yes Other anticoagulants/antiplatelets: no  Past Medical History:  Diagnosis Date  . Cancer (HCC)    Squamous Cell  . Diabetes mellitus without complication ([place])   . Hyperlipemia   . Hypertension   . Hypothyroidism     Social History Social History   Tobacco Use  . Smoking status: Never Smoker  . Smokeless tobacco: Never Used  Substance Use Topics  . Alcohol use: No    Alcohol/week: 0.0 standard drinks  . Drug use: No    Family History Family History  Problem Relation Age of Onset  . Heart disease Mother   . Hypertension Mother   . Hypertension Father     Past Surgical History:  Procedure Laterality Date  . APPENDECTOMY    . CATARACT EXTRACTION Bilateral   . CORONARY ARTERY BYPASS GRAFT    . TONSILLECTOMY    . YAG LASER APPLICATION Right 04/14/5571   Procedure: YAG LASER APPLICATION;  Surgeon: Williams Che, MD;  Location: [facility];  Service: Ophthalmology;  Laterality: Right;    Allergies  Allergen Reactions  . Bactrim [Sulfamethoxazole-Trimethoprim] Other (See Comments)    Elevates potassium.   . Tape Other (See Comments)    not allergic but causes sensitivity.  . Fluorescein Rash  .  Procrit [Epoetin (Alfa)] Rash    Current Outpatient Medications  Medication Sig Dispense Refill  . acetaminophen (TYLENOL) 500 MG tablet Take 500 mg by mouth daily as needed for mild pain or headache.    . Ascorbic Acid (VITAMIN C PO) Take 1 tablet by mouth daily.    Marland Kitchen aspirin EC 81 MG tablet Take 81 mg by mouth daily.    Marland Kitchen atorvastatin (LIPITOR) 80 MG tablet Take 1 tablet (80 mg total) by mouth daily. 30 tablet 6  . cholecalciferol (VITAMIN D) 1000 UNITS tablet Take 1,000 Units by mouth daily.    .  fludrocortisone (FLORINEF) 0.1 MG tablet Take 0.5 tablets by mouth daily.    Marland Kitchen HUMALOG 100 UNIT/ML injection as directed.    . iron polysaccharides (FERREX 150) 150 MG capsule Take 150 mg by mouth daily.    Marland Kitchen levothyroxine (SYNTHROID, LEVOTHROID) 100 MCG tablet Take 1 tablet by mouth daily.    . magnesium chloride (SLOW-MAG) 64 MG TBEC SR tablet Take 1 tablet by mouth 3 (three) times daily.     . Multiple Vitamin (MULTIVITAMIN WITH MINERALS) TABS tablet Take 1 tablet by mouth daily.    . Multiple Vitamins-Minerals (PRESERVISION AREDS 2 PO) Take 1 tablet by mouth daily.     . Omega-3 Fatty Acids (FISH OIL) 1000 MG CAPS Take 1 capsule by mouth daily.    . predniSONE (DELTASONE) 5 MG tablet Take 5 mg by mouth daily with breakfast.    . SODIUM BICARBONATE PO Take by mouth daily. 10 gram    . VELTASSA 8.4 g packet MIX 8.4 GRAM IN ONE GLASS OF WATER AND DRINK EVERY MONDAY, [place] FROM OTHER MEDICATIONS  5   No current facility-administered medications for this visit.     ROS: See HPI for pertinent positives and negatives.   Physical Examination  Vitals:   04/08/18 1011  BP: (!) 137/57  Pulse: 69  Resp: 14  Temp: (!) 97.5 F (36.4 C)  TempSrc: Oral  SpO2: 99%  Weight: 125 lb (56.7 kg)  Height: 5\' 2"  (1.575 m)   Body mass index is 22.86 kg/m.  General: A&O x 3, WDWN petite female Gait: limp, using cane HENT:  moon facies Eyes: PERRLA. Pulmonary: Respirations are non labored, CTAB, good air movement in all fields Cardiac: regular rhythm with occasional to frequent premature contractions, no detected murmur.         Carotid Bruits Right Left   Negative Negative   Radial pulses are 1+ palpable bilaterally   Adominal aortic pulse is not palpable                         VASCULAR EXAM: Extremities without ischemic changes, without Gangrene; without open wounds.  Right ankle with scar tissue and mild depression malformation with  darkened discoloration, limited ROM of right ankle.All toes of both feet are pink and warm with brisk capillary refill.  Right ankle and foot are also warmer to touch than left foot and ankle, tender to touch, and with 1+ pitting edema  LE Pulses Right Left       FEMORAL  1+ palpable  faintly palpable        POPLITEAL  not palpable   not palpable       POSTERIOR TIBIAL  not palpable   not palpable        DORSALIS PEDIS      ANTERIOR TIBIAL not palpable  1+ palpable    Abdomen: soft, NT, no palpable masses. Skin: no rashes,  no ulcers noted. See Extremities. Musculoskeletal: no muscle wasting or atrophy.  Neurologic: A&O X 3; appropriate affect, Sensation is normal; MOTOR FUNCTION:  moving all extremities equally, motor strength 5/5 throughout. Speech is fluent/normal. CN 2-12 intact. Psychiatric: Thought content is normal, mood appropriate for clinical situation.    ASSESSMENT: Kendra Walker is a 68 y.o. female who is s/pright femoral-popliteal bypass graft for infection/ulcer in 2008 in [place].  There are no signs of ischemia in her feet or legs.  She has equal bilateral calf pain with walking about 50 feet, this is not life limiting for her.   I discussed with Dr. Donnetta Hutching pt right fem pop BPG outflow velocity of 397 cm/s, compared to 252 cm/c on 04-04-17. Will schedule pt to see Dr. Trula Slade at his soonest available office appointment to discuss whether to schedule an arteriogram, or how to approach this distal outflow stenosis. Today's non invasive studies: right ABI remains normal with bi and monophasic waveforms; right TBI decreased. Left ABI declined from normal to mild disease, tri and biphasic waveforms, left TBI remains normal.   Her walking is mostly limited by the pain, redness, and swelling of her right foot and ankle since the end of January 2020, negative DVT  study per pt at [facility] about April 01, 2018.   Right foot and lower leg 1+ pitting edema: pt given these instructions: To decrease swelling in your feet and legs: Elevate feet above slightly bent knees, feet above heart, overnight and 3-4 times per day for 20 minutes.   DATA  Right LE Arterial Duplex (04-08-18): Right Graft #1: Right Femoral Popliteal +------------------+--------+--------------+----------+------------------------+                   PSV cm/sStenosis      Waveform  Comments                 +------------------+--------+--------------+----------+------------------------+ Inflow            155     30-49%        monophasic                                                   stenosis                                         +------------------+--------+--------------+----------+------------------------+ Prox Anastomosis  196                   monophasicMixed echogenicty plaque +------------------+--------+--------------+----------+------------------------+ Proximal Graft    236     50-70%        monophasicHomogenous plaque  stenosis                                         +------------------+--------+--------------+----------+------------------------+ Mid Graft         96                                                       +------------------+--------+--------------+----------+------------------------+ Distal Graft      71                                                       +------------------+--------+--------------+----------+------------------------+ Distal Anastomosis84                                                       +------------------+--------+--------------+----------+------------------------+ Outflow           397     50-74%                  Distal to anastamosis                              stenosis                                          +------------------+--------+--------------+----------+------------------------+  Summary: Right: Patent right common femoral artery to popliteal artery bypass graft.  Elevated velocities noted at inflow artery indicating a stenosis of 30-49%.  The proximal bypass graft demonstrates elevated velocities with plaque present indicating a stenosis of 50-70% stenosis.  The outflow artery demonstrates elevated velocities distal to the anastomosis indicating a stenosis of 50-74%.   ABI (Date: 04/08/2018): ABI Findings: +---------+------------------+-----+----------+--------+ Right    Rt Pressure (mmHg)IndexWaveform  Comment  +---------+------------------+-----+----------+--------+ Brachial 160                    triphasic          +---------+------------------+-----+----------+--------+ PTA      114               0.70 biphasic           +---------+------------------+-----+----------+--------+ DP       161               0.99 monophasic         +---------+------------------+-----+----------+--------+ Great Toe57                0.35 Abnormal           +---------+------------------+-----+----------+--------+  +---------+------------------+-----+---------+-------+ Left     Lt Pressure (mmHg)IndexWaveform Comment +---------+------------------+-----+---------+-------+ Brachial 163                    triphasic        +---------+------------------+-----+---------+-------+ PTA      126  0.77 biphasic         +---------+------------------+-----+---------+-------+ DP       132               0.81 triphasic        +---------+------------------+-----+---------+-------+ Great Toe142               0.87 Normal           +---------+------------------+-----+---------+-------+  +-------+-----------+-----------+------------+------------+ ABI/TBIToday's ABIToday's TBIPrevious ABIPrevious  TBI +-------+-----------+-----------+------------+------------+ Right  0.99       0.35       1.54        0.65         +-------+-----------+-----------+------------+------------+ Left   0.81       0.87       1.07        0.85         +-------+-----------+-----------+------------+------------+  Arterial wall calcification precludes accurate ankle pressures and ABIs. Bilateral ABIs appear decreased compared to prior study on 04/04/2017. Right TBIs appear decreased compared to prior study on 04/04/2017. Left TBIs appear essentially unchanged compared to prior study 04/04/2017.   Summary: Right: The right toe-brachial index is abnormal. ABIs are unreliable. RT great toe pressure = 57 mmHg. Although ankle brachial indices are within normal limits (0.95-1.29), arterial Doppler waveforms at the ankle suggest some component of arterial occlusive disease. Left: Resting left ankle-brachial index indicates mild left lower extremity arterial disease. The left toe-brachial index is normal. LT Great toe pressure = 142 mmHg.   PLAN:  Based on the patient's vascular studies and examination, pt will return to clinic at Dr. Stephens Shire soonest available.  Daily seated leg exercises discussed since her right ankle has limited ROM and she walks with a limp.   I discussed in depth with the patient the nature of atherosclerosis, and emphasized the importance of maximal medical management including strict control of blood pressure, blood glucose, and lipid levels, obtaining regular exercise, and continued cessation of smoking.  The patient is aware that without maximal medical management the underlying atherosclerotic disease process will progress, limiting the benefit of any interventions.  The patient was given information about PAD including signs, symptoms, treatment, what symptoms should prompt the patient to seek immediate medical care, and risk reduction measures to take.  Clemon Chambers, RN, MSN,  FNP-C [facility] Phone: [phone]  Clinic MD: Early  04/08/18 10:32 AM

## 2018-04-08 NOTE — Patient Instructions (Signed)
Peripheral Vascular Disease  Peripheral vascular disease (PVD) is a disease of the blood vessels that are not part of your heart and brain. A simple term for PVD is poor circulation. In most cases, PVD narrows the blood vessels that carry blood from your heart to the rest of your body. This can reduce the supply of blood to your arms, legs, and internal organs, like your stomach or kidneys. However, PVD most often affects a person's lower legs and feet. Without treatment, PVD tends to get worse. PVD can also lead to acute ischemic limb. This is when an arm or leg suddenly cannot get enough blood. This is a medical emergency. Follow these instructions at home: Lifestyle  Do not use any products that contain nicotine or tobacco, such as cigarettes and e-cigarettes. If you need help quitting, ask your doctor.  Lose weight if you are overweight. Or, stay at a healthy weight as told by your doctor.  Eat a diet that is low in fat and cholesterol. If you need help, ask your doctor.  Exercise regularly. Ask your doctor for activities that are right for you. General instructions  Take over-the-counter and prescription medicines only as told by your doctor.  Take good care of your feet: ? Wear comfortable shoes that fit well. ? Check your feet often for any cuts or sores.  Keep all follow-up visits as told by your doctor This is important. Contact a doctor if:  You have cramps in your legs when you walk.  You have leg pain when you are at rest.  You have coldness in a leg or foot.  Your skin changes.  You are unable to get or have an erection (erectile dysfunction).  You have cuts or sores on your feet that do not heal. Get help right away if:  Your arm or leg turns cold, numb, and blue.  Your arms or legs become red, warm, swollen, painful, or numb.  You have chest pain.  You have trouble breathing.  You suddenly have weakness in your face, arm, or leg.  You become very  confused or you cannot speak.  You suddenly have a very bad headache.  You suddenly cannot see. Summary  Peripheral vascular disease (PVD) is a disease of the blood vessels.  A simple term for PVD is poor circulation. Without treatment, PVD tends to get worse.  Treatment may include exercise, low fat and low cholesterol diet, and quitting smoking. This information is not intended to replace advice given to you by your health care provider. Make sure you discuss any questions you have with your health care provider. Document Released: 04/04/2009 Document Revised: 02/16/2016 Document Reviewed: 02/16/2016 Elsevier Interactive Patient Education  2019 Elsevier Inc.  

## 2018-04-11 DIAGNOSIS — L851 Acquired keratosis [keratoderma] palmaris et plantaris: Secondary | ICD-10-CM | POA: Diagnosis not present

## 2018-04-11 DIAGNOSIS — B351 Tinea unguium: Secondary | ICD-10-CM | POA: Diagnosis not present

## 2018-04-11 DIAGNOSIS — E1342 Other specified diabetes mellitus with diabetic polyneuropathy: Secondary | ICD-10-CM | POA: Diagnosis not present

## 2018-04-14 ENCOUNTER — Ambulatory Visit: Payer: Medicare Other | Admitting: Surgery

## 2018-04-14 ENCOUNTER — Other Ambulatory Visit: Payer: Self-pay

## 2018-04-14 ENCOUNTER — Telehealth: Payer: Medicare Other | Admitting: Surgery

## 2018-04-14 ENCOUNTER — Telehealth (INDEPENDENT_AMBULATORY_CARE_PROVIDER_SITE_OTHER): Payer: Medicare Other | Admitting: Surgery

## 2018-04-14 DIAGNOSIS — E039 Hypothyroidism, unspecified: Secondary | ICD-10-CM | POA: Diagnosis not present

## 2018-04-14 DIAGNOSIS — I739 Peripheral vascular disease, unspecified: Secondary | ICD-10-CM | POA: Diagnosis not present

## 2018-04-14 DIAGNOSIS — I10 Essential (primary) hypertension: Secondary | ICD-10-CM | POA: Diagnosis not present

## 2018-04-14 DIAGNOSIS — R5383 Other fatigue: Secondary | ICD-10-CM | POA: Diagnosis not present

## 2018-04-14 DIAGNOSIS — E1121 Type 2 diabetes mellitus with diabetic nephropathy: Secondary | ICD-10-CM | POA: Diagnosis not present

## 2018-04-14 DIAGNOSIS — E782 Mixed hyperlipidemia: Secondary | ICD-10-CM | POA: Diagnosis not present

## 2018-04-14 DIAGNOSIS — E1169 Type 2 diabetes mellitus with other specified complication: Secondary | ICD-10-CM | POA: Diagnosis not present

## 2018-04-14 DIAGNOSIS — N184 Chronic kidney disease, stage 4 (severe): Secondary | ICD-10-CM | POA: Diagnosis not present

## 2018-04-14 NOTE — Telephone Encounter (Signed)
Telephone consent for telephone visit was obtained through the patient who wished to proceed.  Briefly, this is a 68 year old female who underwent a right femoral to above-knee popliteal artery bypass graft with vein in 2008 and [place].  This was done for nonhealing wound as the patient had had a ankle fracture with hardware which became infected.  She was able to get her wounds to heal and had not had any vascular surgery follow-up until I saw her in 2017.  She recently underwent ultrasound studies which showed stenosis in the native artery just distal to her bypass graft on the right with velocities approaching 400 cm/s.  She has triphasic waveforms and a slight decrease in her ABIs.  She is also having short distance claudication in both of her legs about 50- 70 feet.  She does not have any open wounds.  She is also complaining of chronic bilateral lower extremity swelling.  I went over the ultrasound results with her.  I think she needs an elective arteriogram via a left femoral approach to evaluate her right femoral-popliteal bypass graft with possible intervention.  She also needs to have her left leg evaluated since she is having claudication in that leg with a 20 point drop in her ABIs.  I discussed the details of the arteriogram.  I am going to try to schedule her in approximately 6 weeks.  With regards to her swelling, we discussed contacting elastic therapy to get compression stockings.  Total phone time was 13 minutes.  Annamarie Major

## 2018-04-18 DIAGNOSIS — J8410 Pulmonary fibrosis, unspecified: Secondary | ICD-10-CM | POA: Diagnosis not present

## 2018-04-18 DIAGNOSIS — E039 Hypothyroidism, unspecified: Secondary | ICD-10-CM | POA: Diagnosis not present

## 2018-04-18 DIAGNOSIS — E1021 Type 1 diabetes mellitus with diabetic nephropathy: Secondary | ICD-10-CM | POA: Diagnosis not present

## 2018-04-18 DIAGNOSIS — E782 Mixed hyperlipidemia: Secondary | ICD-10-CM | POA: Diagnosis not present

## 2018-04-18 DIAGNOSIS — I10 Essential (primary) hypertension: Secondary | ICD-10-CM | POA: Diagnosis not present

## 2018-04-18 DIAGNOSIS — Z6821 Body mass index (BMI) 21.0-21.9, adult: Secondary | ICD-10-CM | POA: Diagnosis not present

## 2018-04-18 DIAGNOSIS — I739 Peripheral vascular disease, unspecified: Secondary | ICD-10-CM | POA: Diagnosis not present

## 2018-04-18 DIAGNOSIS — D631 Anemia in chronic kidney disease: Secondary | ICD-10-CM | POA: Diagnosis not present

## 2018-04-29 DIAGNOSIS — I5032 Chronic diastolic (congestive) heart failure: Secondary | ICD-10-CM | POA: Diagnosis not present

## 2018-04-29 DIAGNOSIS — E039 Hypothyroidism, unspecified: Secondary | ICD-10-CM | POA: Diagnosis not present

## 2018-04-29 DIAGNOSIS — D631 Anemia in chronic kidney disease: Secondary | ICD-10-CM | POA: Diagnosis not present

## 2018-04-29 DIAGNOSIS — E782 Mixed hyperlipidemia: Secondary | ICD-10-CM | POA: Diagnosis not present

## 2018-04-29 DIAGNOSIS — I10 Essential (primary) hypertension: Secondary | ICD-10-CM | POA: Diagnosis not present

## 2018-04-29 DIAGNOSIS — N184 Chronic kidney disease, stage 4 (severe): Secondary | ICD-10-CM | POA: Diagnosis not present

## 2018-04-29 DIAGNOSIS — E1121 Type 2 diabetes mellitus with diabetic nephropathy: Secondary | ICD-10-CM | POA: Diagnosis not present

## 2018-04-29 DIAGNOSIS — R5383 Other fatigue: Secondary | ICD-10-CM | POA: Diagnosis not present

## 2018-04-30 DIAGNOSIS — N184 Chronic kidney disease, stage 4 (severe): Secondary | ICD-10-CM | POA: Diagnosis not present

## 2018-05-01 DIAGNOSIS — E1022 Type 1 diabetes mellitus with diabetic chronic kidney disease: Secondary | ICD-10-CM | POA: Diagnosis not present

## 2018-05-01 DIAGNOSIS — Z7982 Long term (current) use of aspirin: Secondary | ICD-10-CM | POA: Diagnosis not present

## 2018-05-01 DIAGNOSIS — N183 Chronic kidney disease, stage 3 (moderate): Secondary | ICD-10-CM | POA: Diagnosis not present

## 2018-05-01 DIAGNOSIS — E875 Hyperkalemia: Secondary | ICD-10-CM | POA: Diagnosis not present

## 2018-05-01 DIAGNOSIS — I2510 Atherosclerotic heart disease of native coronary artery without angina pectoris: Secondary | ICD-10-CM | POA: Diagnosis not present

## 2018-05-01 DIAGNOSIS — Z794 Long term (current) use of insulin: Secondary | ICD-10-CM | POA: Diagnosis not present

## 2018-05-01 DIAGNOSIS — Z9641 Presence of insulin pump (external) (internal): Secondary | ICD-10-CM | POA: Diagnosis not present

## 2018-06-09 DIAGNOSIS — R5383 Other fatigue: Secondary | ICD-10-CM | POA: Diagnosis not present

## 2018-06-09 DIAGNOSIS — I10 Essential (primary) hypertension: Secondary | ICD-10-CM | POA: Diagnosis not present

## 2018-06-09 DIAGNOSIS — I5032 Chronic diastolic (congestive) heart failure: Secondary | ICD-10-CM | POA: Diagnosis not present

## 2018-06-09 DIAGNOSIS — E782 Mixed hyperlipidemia: Secondary | ICD-10-CM | POA: Diagnosis not present

## 2018-06-09 DIAGNOSIS — E1121 Type 2 diabetes mellitus with diabetic nephropathy: Secondary | ICD-10-CM | POA: Diagnosis not present

## 2018-06-09 DIAGNOSIS — E039 Hypothyroidism, unspecified: Secondary | ICD-10-CM | POA: Diagnosis not present

## 2018-06-11 DIAGNOSIS — R300 Dysuria: Secondary | ICD-10-CM | POA: Diagnosis not present

## 2018-06-27 DIAGNOSIS — E1342 Other specified diabetes mellitus with diabetic polyneuropathy: Secondary | ICD-10-CM | POA: Diagnosis not present

## 2018-06-27 DIAGNOSIS — L851 Acquired keratosis [keratoderma] palmaris et plantaris: Secondary | ICD-10-CM | POA: Diagnosis not present

## 2018-06-27 DIAGNOSIS — B351 Tinea unguium: Secondary | ICD-10-CM | POA: Diagnosis not present

## 2018-07-10 DIAGNOSIS — E1065 Type 1 diabetes mellitus with hyperglycemia: Secondary | ICD-10-CM | POA: Diagnosis not present

## 2018-07-10 DIAGNOSIS — E108 Type 1 diabetes mellitus with unspecified complications: Secondary | ICD-10-CM | POA: Diagnosis not present

## 2018-07-10 DIAGNOSIS — Z4681 Encounter for fitting and adjustment of insulin pump: Secondary | ICD-10-CM | POA: Diagnosis not present

## 2018-07-10 DIAGNOSIS — E10649 Type 1 diabetes mellitus with hypoglycemia without coma: Secondary | ICD-10-CM | POA: Diagnosis not present

## 2018-07-10 DIAGNOSIS — E039 Hypothyroidism, unspecified: Secondary | ICD-10-CM | POA: Diagnosis not present

## 2018-07-10 DIAGNOSIS — E109 Type 1 diabetes mellitus without complications: Secondary | ICD-10-CM | POA: Diagnosis not present

## 2018-07-10 DIAGNOSIS — E271 Primary adrenocortical insufficiency: Secondary | ICD-10-CM | POA: Diagnosis not present

## 2018-07-31 DIAGNOSIS — Z1231 Encounter for screening mammogram for malignant neoplasm of breast: Secondary | ICD-10-CM | POA: Diagnosis not present

## 2018-08-18 DIAGNOSIS — H04123 Dry eye syndrome of bilateral lacrimal glands: Secondary | ICD-10-CM | POA: Diagnosis not present

## 2018-08-18 DIAGNOSIS — H353132 Nonexudative age-related macular degeneration, bilateral, intermediate dry stage: Secondary | ICD-10-CM | POA: Diagnosis not present

## 2018-08-18 DIAGNOSIS — H0102A Squamous blepharitis right eye, upper and lower eyelids: Secondary | ICD-10-CM | POA: Diagnosis not present

## 2018-08-18 DIAGNOSIS — E103593 Type 1 diabetes mellitus with proliferative diabetic retinopathy without macular edema, bilateral: Secondary | ICD-10-CM | POA: Diagnosis not present

## 2018-08-18 DIAGNOSIS — H0102B Squamous blepharitis left eye, upper and lower eyelids: Secondary | ICD-10-CM | POA: Diagnosis not present

## 2018-08-18 DIAGNOSIS — Z961 Presence of intraocular lens: Secondary | ICD-10-CM | POA: Diagnosis not present

## 2018-09-02 DIAGNOSIS — H353222 Exudative age-related macular degeneration, left eye, with inactive choroidal neovascularization: Secondary | ICD-10-CM | POA: Diagnosis not present

## 2018-09-02 DIAGNOSIS — H353133 Nonexudative age-related macular degeneration, bilateral, advanced atrophic without subfoveal involvement: Secondary | ICD-10-CM | POA: Diagnosis not present

## 2018-09-02 DIAGNOSIS — H35373 Puckering of macula, bilateral: Secondary | ICD-10-CM | POA: Diagnosis not present

## 2018-09-02 DIAGNOSIS — E113553 Type 2 diabetes mellitus with stable proliferative diabetic retinopathy, bilateral: Secondary | ICD-10-CM | POA: Diagnosis not present

## 2018-09-05 DIAGNOSIS — B351 Tinea unguium: Secondary | ICD-10-CM | POA: Diagnosis not present

## 2018-09-05 DIAGNOSIS — L851 Acquired keratosis [keratoderma] palmaris et plantaris: Secondary | ICD-10-CM | POA: Diagnosis not present

## 2018-09-05 DIAGNOSIS — E1342 Other specified diabetes mellitus with diabetic polyneuropathy: Secondary | ICD-10-CM | POA: Diagnosis not present

## 2018-10-10 DIAGNOSIS — E782 Mixed hyperlipidemia: Secondary | ICD-10-CM | POA: Diagnosis not present

## 2018-10-10 DIAGNOSIS — E1121 Type 2 diabetes mellitus with diabetic nephropathy: Secondary | ICD-10-CM | POA: Diagnosis not present

## 2018-10-10 DIAGNOSIS — I10 Essential (primary) hypertension: Secondary | ICD-10-CM | POA: Diagnosis not present

## 2018-10-10 DIAGNOSIS — D631 Anemia in chronic kidney disease: Secondary | ICD-10-CM | POA: Diagnosis not present

## 2018-10-10 DIAGNOSIS — E559 Vitamin D deficiency, unspecified: Secondary | ICD-10-CM | POA: Diagnosis not present

## 2018-10-10 DIAGNOSIS — E039 Hypothyroidism, unspecified: Secondary | ICD-10-CM | POA: Diagnosis not present

## 2018-10-10 DIAGNOSIS — I5032 Chronic diastolic (congestive) heart failure: Secondary | ICD-10-CM | POA: Diagnosis not present

## 2018-10-14 DIAGNOSIS — J8410 Pulmonary fibrosis, unspecified: Secondary | ICD-10-CM | POA: Diagnosis not present

## 2018-10-14 DIAGNOSIS — Z6822 Body mass index (BMI) 22.0-22.9, adult: Secondary | ICD-10-CM | POA: Diagnosis not present

## 2018-10-14 DIAGNOSIS — I739 Peripheral vascular disease, unspecified: Secondary | ICD-10-CM | POA: Diagnosis not present

## 2018-10-14 DIAGNOSIS — Z0001 Encounter for general adult medical examination with abnormal findings: Secondary | ICD-10-CM | POA: Diagnosis not present

## 2018-10-14 DIAGNOSIS — I5032 Chronic diastolic (congestive) heart failure: Secondary | ICD-10-CM | POA: Diagnosis not present

## 2018-10-14 DIAGNOSIS — Z23 Encounter for immunization: Secondary | ICD-10-CM | POA: Diagnosis not present

## 2018-10-14 DIAGNOSIS — E1021 Type 1 diabetes mellitus with diabetic nephropathy: Secondary | ICD-10-CM | POA: Diagnosis not present

## 2018-10-14 DIAGNOSIS — I10 Essential (primary) hypertension: Secondary | ICD-10-CM | POA: Diagnosis not present

## 2018-10-16 DIAGNOSIS — Z794 Long term (current) use of insulin: Secondary | ICD-10-CM | POA: Diagnosis not present

## 2018-10-16 DIAGNOSIS — Z7952 Long term (current) use of systemic steroids: Secondary | ICD-10-CM | POA: Diagnosis not present

## 2018-10-16 DIAGNOSIS — Z79899 Other long term (current) drug therapy: Secondary | ICD-10-CM | POA: Diagnosis not present

## 2018-10-16 DIAGNOSIS — Z9641 Presence of insulin pump (external) (internal): Secondary | ICD-10-CM | POA: Diagnosis not present

## 2018-10-16 DIAGNOSIS — E039 Hypothyroidism, unspecified: Secondary | ICD-10-CM | POA: Diagnosis not present

## 2018-10-16 DIAGNOSIS — E271 Primary adrenocortical insufficiency: Secondary | ICD-10-CM | POA: Diagnosis not present

## 2018-10-16 DIAGNOSIS — Z7982 Long term (current) use of aspirin: Secondary | ICD-10-CM | POA: Diagnosis not present

## 2018-10-16 DIAGNOSIS — E10649 Type 1 diabetes mellitus with hypoglycemia without coma: Secondary | ICD-10-CM | POA: Diagnosis not present

## 2018-10-16 DIAGNOSIS — E063 Autoimmune thyroiditis: Secondary | ICD-10-CM | POA: Diagnosis not present

## 2018-10-16 DIAGNOSIS — I10 Essential (primary) hypertension: Secondary | ICD-10-CM | POA: Diagnosis not present

## 2018-10-16 DIAGNOSIS — E1065 Type 1 diabetes mellitus with hyperglycemia: Secondary | ICD-10-CM | POA: Diagnosis not present

## 2018-10-20 DIAGNOSIS — E271 Primary adrenocortical insufficiency: Secondary | ICD-10-CM | POA: Diagnosis not present

## 2018-10-20 DIAGNOSIS — N924 Excessive bleeding in the premenopausal period: Secondary | ICD-10-CM | POA: Diagnosis not present

## 2018-10-20 DIAGNOSIS — N950 Postmenopausal bleeding: Secondary | ICD-10-CM | POA: Diagnosis not present

## 2018-10-24 DIAGNOSIS — N840 Polyp of corpus uteri: Secondary | ICD-10-CM | POA: Diagnosis not present

## 2018-10-24 DIAGNOSIS — N939 Abnormal uterine and vaginal bleeding, unspecified: Secondary | ICD-10-CM | POA: Diagnosis not present

## 2018-10-24 DIAGNOSIS — N950 Postmenopausal bleeding: Secondary | ICD-10-CM | POA: Diagnosis not present

## 2018-10-30 DIAGNOSIS — I129 Hypertensive chronic kidney disease with stage 1 through stage 4 chronic kidney disease, or unspecified chronic kidney disease: Secondary | ICD-10-CM | POA: Diagnosis not present

## 2018-10-30 DIAGNOSIS — Z951 Presence of aortocoronary bypass graft: Secondary | ICD-10-CM | POA: Diagnosis not present

## 2018-10-30 DIAGNOSIS — I2510 Atherosclerotic heart disease of native coronary artery without angina pectoris: Secondary | ICD-10-CM | POA: Diagnosis not present

## 2018-10-30 DIAGNOSIS — Z794 Long term (current) use of insulin: Secondary | ICD-10-CM | POA: Diagnosis not present

## 2018-10-30 DIAGNOSIS — Z7952 Long term (current) use of systemic steroids: Secondary | ICD-10-CM | POA: Diagnosis not present

## 2018-10-30 DIAGNOSIS — Z9641 Presence of insulin pump (external) (internal): Secondary | ICD-10-CM | POA: Diagnosis not present

## 2018-10-30 DIAGNOSIS — D631 Anemia in chronic kidney disease: Secondary | ICD-10-CM | POA: Diagnosis not present

## 2018-10-30 DIAGNOSIS — E2740 Unspecified adrenocortical insufficiency: Secondary | ICD-10-CM | POA: Diagnosis not present

## 2018-10-30 DIAGNOSIS — E109 Type 1 diabetes mellitus without complications: Secondary | ICD-10-CM | POA: Diagnosis not present

## 2018-10-30 DIAGNOSIS — N1831 Chronic kidney disease, stage 3a: Secondary | ICD-10-CM | POA: Diagnosis not present

## 2018-11-14 DIAGNOSIS — L851 Acquired keratosis [keratoderma] palmaris et plantaris: Secondary | ICD-10-CM | POA: Diagnosis not present

## 2018-11-14 DIAGNOSIS — B351 Tinea unguium: Secondary | ICD-10-CM | POA: Diagnosis not present

## 2018-11-14 DIAGNOSIS — E1342 Other specified diabetes mellitus with diabetic polyneuropathy: Secondary | ICD-10-CM | POA: Diagnosis not present

## 2018-11-28 DIAGNOSIS — N950 Postmenopausal bleeding: Secondary | ICD-10-CM | POA: Diagnosis not present

## 2018-11-28 DIAGNOSIS — N189 Chronic kidney disease, unspecified: Secondary | ICD-10-CM | POA: Diagnosis not present

## 2018-11-28 DIAGNOSIS — E7800 Pure hypercholesterolemia, unspecified: Secondary | ICD-10-CM | POA: Diagnosis not present

## 2018-11-28 DIAGNOSIS — Z951 Presence of aortocoronary bypass graft: Secondary | ICD-10-CM | POA: Diagnosis not present

## 2018-11-28 DIAGNOSIS — Z91040 Latex allergy status: Secondary | ICD-10-CM | POA: Diagnosis not present

## 2018-11-28 DIAGNOSIS — Z01818 Encounter for other preprocedural examination: Secondary | ICD-10-CM | POA: Diagnosis not present

## 2018-11-28 DIAGNOSIS — Z9641 Presence of insulin pump (external) (internal): Secondary | ICD-10-CM | POA: Diagnosis not present

## 2018-11-28 DIAGNOSIS — Z86718 Personal history of other venous thrombosis and embolism: Secondary | ICD-10-CM | POA: Diagnosis not present

## 2018-11-28 DIAGNOSIS — Z7982 Long term (current) use of aspirin: Secondary | ICD-10-CM | POA: Diagnosis not present

## 2018-11-28 DIAGNOSIS — Z7952 Long term (current) use of systemic steroids: Secondary | ICD-10-CM | POA: Diagnosis not present

## 2018-11-28 DIAGNOSIS — I2510 Atherosclerotic heart disease of native coronary artery without angina pectoris: Secondary | ICD-10-CM | POA: Diagnosis not present

## 2018-11-28 DIAGNOSIS — E1122 Type 2 diabetes mellitus with diabetic chronic kidney disease: Secondary | ICD-10-CM | POA: Diagnosis not present

## 2018-11-28 DIAGNOSIS — Z794 Long term (current) use of insulin: Secondary | ICD-10-CM | POA: Diagnosis not present

## 2018-11-28 DIAGNOSIS — Z79899 Other long term (current) drug therapy: Secondary | ICD-10-CM | POA: Diagnosis not present

## 2018-11-28 DIAGNOSIS — E039 Hypothyroidism, unspecified: Secondary | ICD-10-CM | POA: Diagnosis not present

## 2018-11-28 DIAGNOSIS — Z888 Allergy status to other drugs, medicaments and biological substances status: Secondary | ICD-10-CM | POA: Diagnosis not present

## 2018-11-28 DIAGNOSIS — Z881 Allergy status to other antibiotic agents status: Secondary | ICD-10-CM | POA: Diagnosis not present

## 2018-11-28 DIAGNOSIS — I129 Hypertensive chronic kidney disease with stage 1 through stage 4 chronic kidney disease, or unspecified chronic kidney disease: Secondary | ICD-10-CM | POA: Diagnosis not present

## 2018-12-02 DIAGNOSIS — E1122 Type 2 diabetes mellitus with diabetic chronic kidney disease: Secondary | ICD-10-CM | POA: Diagnosis not present

## 2018-12-02 DIAGNOSIS — N189 Chronic kidney disease, unspecified: Secondary | ICD-10-CM | POA: Diagnosis not present

## 2018-12-02 DIAGNOSIS — E039 Hypothyroidism, unspecified: Secondary | ICD-10-CM | POA: Diagnosis not present

## 2018-12-02 DIAGNOSIS — N950 Postmenopausal bleeding: Secondary | ICD-10-CM | POA: Diagnosis not present

## 2018-12-02 DIAGNOSIS — I2510 Atherosclerotic heart disease of native coronary artery without angina pectoris: Secondary | ICD-10-CM | POA: Diagnosis not present

## 2018-12-02 DIAGNOSIS — I129 Hypertensive chronic kidney disease with stage 1 through stage 4 chronic kidney disease, or unspecified chronic kidney disease: Secondary | ICD-10-CM | POA: Diagnosis not present

## 2018-12-08 DIAGNOSIS — N950 Postmenopausal bleeding: Secondary | ICD-10-CM | POA: Diagnosis not present

## 2018-12-15 DIAGNOSIS — M85851 Other specified disorders of bone density and structure, right thigh: Secondary | ICD-10-CM | POA: Diagnosis not present

## 2018-12-15 DIAGNOSIS — M8588 Other specified disorders of bone density and structure, other site: Secondary | ICD-10-CM | POA: Diagnosis not present

## 2018-12-15 DIAGNOSIS — M810 Age-related osteoporosis without current pathological fracture: Secondary | ICD-10-CM | POA: Diagnosis not present

## 2019-03-17 DIAGNOSIS — B351 Tinea unguium: Secondary | ICD-10-CM | POA: Diagnosis not present

## 2019-03-17 DIAGNOSIS — L851 Acquired keratosis [keratoderma] palmaris et plantaris: Secondary | ICD-10-CM | POA: Diagnosis not present

## 2019-03-17 DIAGNOSIS — E1342 Other specified diabetes mellitus with diabetic polyneuropathy: Secondary | ICD-10-CM | POA: Diagnosis not present

## 2019-04-02 DIAGNOSIS — Z23 Encounter for immunization: Secondary | ICD-10-CM | POA: Diagnosis not present

## 2019-04-15 DIAGNOSIS — E1121 Type 2 diabetes mellitus with diabetic nephropathy: Secondary | ICD-10-CM | POA: Diagnosis not present

## 2019-04-15 DIAGNOSIS — N184 Chronic kidney disease, stage 4 (severe): Secondary | ICD-10-CM | POA: Diagnosis not present

## 2019-04-15 DIAGNOSIS — E039 Hypothyroidism, unspecified: Secondary | ICD-10-CM | POA: Diagnosis not present

## 2019-04-15 DIAGNOSIS — I5032 Chronic diastolic (congestive) heart failure: Secondary | ICD-10-CM | POA: Diagnosis not present

## 2019-04-15 DIAGNOSIS — I10 Essential (primary) hypertension: Secondary | ICD-10-CM | POA: Diagnosis not present

## 2019-04-15 DIAGNOSIS — E782 Mixed hyperlipidemia: Secondary | ICD-10-CM | POA: Diagnosis not present

## 2019-04-16 DIAGNOSIS — R300 Dysuria: Secondary | ICD-10-CM | POA: Diagnosis not present

## 2019-04-16 DIAGNOSIS — Z20828 Contact with and (suspected) exposure to other viral communicable diseases: Secondary | ICD-10-CM | POA: Diagnosis not present

## 2019-04-16 DIAGNOSIS — E039 Hypothyroidism, unspecified: Secondary | ICD-10-CM | POA: Diagnosis not present

## 2019-04-16 DIAGNOSIS — R509 Fever, unspecified: Secondary | ICD-10-CM | POA: Diagnosis not present

## 2019-04-16 DIAGNOSIS — E1065 Type 1 diabetes mellitus with hyperglycemia: Secondary | ICD-10-CM | POA: Diagnosis not present

## 2019-04-16 DIAGNOSIS — E271 Primary adrenocortical insufficiency: Secondary | ICD-10-CM | POA: Diagnosis not present

## 2019-04-16 DIAGNOSIS — Z9641 Presence of insulin pump (external) (internal): Secondary | ICD-10-CM | POA: Diagnosis not present

## 2019-04-16 DIAGNOSIS — I10 Essential (primary) hypertension: Secondary | ICD-10-CM | POA: Diagnosis not present

## 2019-04-16 DIAGNOSIS — R1110 Vomiting, unspecified: Secondary | ICD-10-CM | POA: Diagnosis not present

## 2019-04-16 DIAGNOSIS — E108 Type 1 diabetes mellitus with unspecified complications: Secondary | ICD-10-CM | POA: Diagnosis not present

## 2019-04-21 DIAGNOSIS — J8410 Pulmonary fibrosis, unspecified: Secondary | ICD-10-CM | POA: Diagnosis not present

## 2019-04-21 DIAGNOSIS — M898X6 Other specified disorders of bone, lower leg: Secondary | ICD-10-CM | POA: Diagnosis not present

## 2019-04-21 DIAGNOSIS — I10 Essential (primary) hypertension: Secondary | ICD-10-CM | POA: Diagnosis not present

## 2019-04-21 DIAGNOSIS — E1021 Type 1 diabetes mellitus with diabetic nephropathy: Secondary | ICD-10-CM | POA: Diagnosis not present

## 2019-04-21 DIAGNOSIS — I5032 Chronic diastolic (congestive) heart failure: Secondary | ICD-10-CM | POA: Diagnosis not present

## 2019-04-21 DIAGNOSIS — N390 Urinary tract infection, site not specified: Secondary | ICD-10-CM | POA: Diagnosis not present

## 2019-04-21 DIAGNOSIS — N184 Chronic kidney disease, stage 4 (severe): Secondary | ICD-10-CM | POA: Diagnosis not present

## 2019-04-21 DIAGNOSIS — Z6823 Body mass index (BMI) 23.0-23.9, adult: Secondary | ICD-10-CM | POA: Diagnosis not present

## 2019-04-22 DIAGNOSIS — I10 Essential (primary) hypertension: Secondary | ICD-10-CM | POA: Diagnosis not present

## 2019-04-22 DIAGNOSIS — Z23 Encounter for immunization: Secondary | ICD-10-CM | POA: Diagnosis not present

## 2019-04-22 DIAGNOSIS — E7849 Other hyperlipidemia: Secondary | ICD-10-CM | POA: Diagnosis not present

## 2019-04-28 ENCOUNTER — Telehealth: Payer: Self-pay

## 2019-04-28 NOTE — Telephone Encounter (Signed)
Pt postponed having AGM due to COVID. Discussed that she will need to be seen in office and have studies done prior to scheduling any procedures. Pt verbalized understanding.

## 2019-05-07 DIAGNOSIS — S82191A Other fracture of upper end of right tibia, initial encounter for closed fracture: Secondary | ICD-10-CM | POA: Diagnosis not present

## 2019-05-07 DIAGNOSIS — M898X6 Other specified disorders of bone, lower leg: Secondary | ICD-10-CM | POA: Diagnosis not present

## 2019-05-07 DIAGNOSIS — S82101A Unspecified fracture of upper end of right tibia, initial encounter for closed fracture: Secondary | ICD-10-CM | POA: Diagnosis not present

## 2019-05-07 DIAGNOSIS — M25461 Effusion, right knee: Secondary | ICD-10-CM | POA: Diagnosis not present

## 2019-05-13 ENCOUNTER — Other Ambulatory Visit: Payer: Self-pay | Admitting: *Deleted

## 2019-05-13 DIAGNOSIS — I779 Disorder of arteries and arterioles, unspecified: Secondary | ICD-10-CM

## 2019-05-15 ENCOUNTER — Telehealth (HOSPITAL_COMMUNITY): Payer: Self-pay

## 2019-05-15 NOTE — Telephone Encounter (Signed)

## 2019-05-18 ENCOUNTER — Encounter: Payer: Self-pay | Admitting: Surgery

## 2019-05-18 ENCOUNTER — Ambulatory Visit (HOSPITAL_COMMUNITY)
Admission: RE | Admit: 2019-05-18 | Discharge: 2019-05-18 | Disposition: A | Discharge status: Home / Self Care | Payer: Medicare Other | Source: Ambulatory Visit | Attending: Surgery | Admitting: Surgery

## 2019-05-18 ENCOUNTER — Ambulatory Visit (INDEPENDENT_AMBULATORY_CARE_PROVIDER_SITE_OTHER): Payer: Medicare Other | Admitting: Surgery

## 2019-05-18 ENCOUNTER — Ambulatory Visit (INDEPENDENT_AMBULATORY_CARE_PROVIDER_SITE_OTHER)
Admission: RE | Admit: 2019-05-18 | Discharge: 2019-05-18 | Disposition: A | Discharge status: Home / Self Care | Payer: Medicare Other | Source: Ambulatory Visit | Attending: Surgery | Admitting: Surgery

## 2019-05-18 ENCOUNTER — Other Ambulatory Visit: Payer: Self-pay

## 2019-05-18 VITALS — BP 173/77 | HR 72 | Temp 97.7°F | Resp 20 | Ht 62.0 in | Wt 126.0 lb

## 2019-05-18 DIAGNOSIS — I779 Disorder of arteries and arterioles, unspecified: Secondary | ICD-10-CM | POA: Insufficient documentation

## 2019-05-18 DIAGNOSIS — I70213 Atherosclerosis of native arteries of extremities with intermittent claudication, bilateral legs: Secondary | ICD-10-CM

## 2019-05-18 NOTE — H&P (View-Only) (Signed)
[facility]  Patient name: Kendra Walker MRN: [id number] DOB: [birth year] Sex: female   REASON FOR VISIT:    Follow up  [place] ILLNESS:    Kendra Walker is a 69 y.o. female who I met in 2017 when she moved to [facility] and wanted to reestablish vascular care+ year.  She has a history of a right femoral to above-knee popliteal bypass graft with vein in 2008 in [place] which was performed for a nonhealing wound.  The patient has history of coronary artery disease.  She is status post CABG in 2004 she takes a statin for hypercholesterolemia.  She has stage III renal insufficiency.  She is a diabetic on insulin pump.  She is a non-smoker.  She is medically managed for hypertension.  She reports that over the past year she has been having progressive symptoms in her left leg.  She has trouble getting up a ramp which measures approximately 50 feet in length.  She gets cramping and pain which is alleviated with rest.  She does not have rest pain.  She does not have open wounds.   PAST MEDICAL HISTORY:   Past Medical History:  Diagnosis Date  . Cancer (HCC)    Squamous Cell  . Diabetes mellitus without complication ([place])   . Hyperlipemia   . Hypertension   . Hypothyroidism      FAMILY HISTORY:   Family History  Problem Relation Age of Onset  . Heart disease Mother   . Hypertension Mother   . Hypertension Father     SOCIAL HISTORY:   Social History   Tobacco Use  . Smoking status: Never Smoker  . Smokeless tobacco: Never Used  Substance Use Topics  . Alcohol use: No    Alcohol/week: 0.0 standard drinks     ALLERGIES:   Allergies  Allergen Reactions  . Bactrim [Sulfamethoxazole-Trimethoprim] Other (See Comments)    Elevates potassium.   . Tape Other (See Comments)    not allergic but causes sensitivity.  . Fluorescein Rash  . Procrit [Epoetin (Alfa)] Rash     CURRENT  MEDICATIONS:   Current Outpatient Medications  Medication Sig Dispense Refill  . acetaminophen (TYLENOL) 500 MG tablet Take 500 mg by mouth daily as needed for mild pain or headache.    . Ascorbic Acid (VITAMIN C PO) Take 1 tablet by mouth daily.    Marland Kitchen aspirin EC 81 MG tablet Take 81 mg by mouth daily.    Marland Kitchen atorvastatin (LIPITOR) 80 MG tablet Take 1 tablet (80 mg total) by mouth daily. 30 tablet 6  . calcium gluconate 500 MG tablet Take 1 tablet by mouth 2 (two) times daily.    . cholecalciferol (VITAMIN D) 1000 UNITS tablet Take 1,000 Units by mouth daily.    . fludrocortisone (FLORINEF) 0.1 MG tablet Take 0.5 tablets by mouth daily.    Marland Kitchen HUMALOG 100 UNIT/ML injection as directed.    . iron polysaccharides (FERREX 150) 150 MG capsule Take 150 mg by mouth daily.    Marland Kitchen levothyroxine (SYNTHROID, LEVOTHROID) 100 MCG tablet Take 1 tablet by mouth daily.    . magnesium chloride (SLOW-MAG) 64 MG TBEC SR tablet Take 1 tablet by mouth 3 (three) times daily.     . Multiple Vitamin (MULTIVITAMIN WITH MINERALS) TABS tablet Take 1 tablet by mouth daily.    . Multiple Vitamins-Minerals (PRESERVISION AREDS 2 PO) Take 1 tablet by mouth daily.     . Omega-3 Fatty  Acids (FISH OIL) 1000 MG CAPS Take 1 capsule by mouth daily.    . predniSONE (DELTASONE) 5 MG tablet Take 5 mg by mouth daily with breakfast.    . SODIUM BICARBONATE PO Take by mouth daily. 10 gram    . VELTASSA 8.4 g packet MIX 8.4 GRAM IN ONE GLASS OF WATER AND DRINK EVERY MONDAY, [place] FROM OTHER MEDICATIONS  5   No current facility-administered medications for this visit.    REVIEW OF SYSTEMS:   _0  denotes positive finding, _1  denotes negative finding Cardiac  Comments:  Chest pain or chest pressure:    Shortness of breath upon exertion: x   Short of breath when lying flat:    Irregular heart rhythm:        Vascular    Pain in calf, thigh, or hip brought on by ambulation: x   Pain in  feet at night that wakes you up from your sleep:     Blood clot in your veins:    Leg swelling:         Pulmonary    Oxygen at home:    Productive cough:     Wheezing:         Neurologic    Sudden weakness in arms or legs:     Sudden numbness in arms or legs:     Sudden onset of difficulty speaking or slurred speech:    Temporary loss of vision in one eye:     Problems with dizziness:         Gastrointestinal    Blood in stool:     Vomited blood:         Genitourinary    Burning when urinating:     Blood in urine:        Psychiatric    Major depression:         Hematologic    Bleeding problems:    Problems with blood clotting too easily:        Skin    Rashes or ulcers:        Constitutional    Fever or chills:      PHYSICAL EXAM:   Vitals:   05/18/19 1424  BP: (!) 173/77  Pulse: 72  Resp: 20  Temp: 97.7 F (36.5 C)  SpO2: 96%  Weight: 126 lb (57.2 kg)  Height: _2  (1.575 m)    GENERAL: The patient is a well-nourished female, in no acute distress. The vital signs are documented above. CARDIAC: There is a regular rate and rhythm.  VASCULAR: Nonpalpable pedal pulses PULMONARY: Non-labored respirations ABDOMEN: Soft and non-tender  MUSCULOSKELETAL: There are no major deformities or cyanosis. NEUROLOGIC: No focal weakness or paresthesias are detected. SKIN: There are no ulcers or rashes noted. PSYCHIATRIC: The patient has a normal affect.  STUDIES:   I have reviewed the following studies: +-------+-----------+-----------+------------+------------+  ABI/TBIToday's ABIToday's TBIPrevious ABIPrevious TBI  +-------+-----------+-----------+------------+------------+  Right [place]     0.73    0.99    0.35      +-------+-----------+-----------+------------+------------+  Left  [place]     0.28    0.81    0.87      +-------+-----------+-----------+------------+------------+   LE duplex: Right: Patent  femoropopliteal bypass graft with native inflow velocities  suggesive of a 30-49% stenosis.  The proximal anastomosis demonstrates elevated velocities in the 50-74%  stenosis range.  The proximal bypass graft velocities suggest a 50-74% stenosis.  The adjacent outflow artery is observed with a mild amount of disease;  however, several centimeters from the anastomosis, the native artery  velocities suggest 75-99% stenosis.   MEDICAL ISSUES:   Left leg claudication: The patient has dampened waveforms in the left leg suggestive of atherosclerosis and vascular insufficiency as the etiology of her leg pain.  This will need to be evaluated with angiography.  Right leg bypass graft stenosis: Patient has elevated velocities distal to her bypass graft which put her bypass at risk for failure.  I have recommended that we proceed with angiography.  The first step will be from a left femoral approach to evaluate her bypass graft and to intervene in any potential areas of concern.  At the same time I will get pictures of her left leg to plan our next intervention whether it be bypass grafting or stenting.  She is very concerned about her renal insufficiency.  We will exercise extreme caution limiting her contrast and making sure that she stays hydrated.  The patient will schedule this on a Tuesday in the near future.    Leia Alf, MD, FACS [facility] [phone] Pager [phone]

## 2019-05-18 NOTE — Progress Notes (Signed)
 [facility]  Patient name: Kendra Walker MRN: [id number] DOB: 09/13/1950 Sex: female   REASON FOR VISIT:    Follow up  [place]ISOT[place] OF PRESENT ILLNESS:    Kendra Walker is a 68 y.o. female who I met in 2017 when she moved to [place] and wanted to reestablish vascular care+ year.  She has a history of a right femoral to above-knee popliteal bypass graft with vein in 2008 in [place] which was performed for a nonhealing wound.  The patient has history of coronary artery disease.  She is status post CABG in 2004 she takes a statin for hypercholesterolemia.  She has stage III renal insufficiency.  She is a diabetic on insulin pump.  She is a non-smoker.  She is medically managed for hypertension.  She reports that over the past year she has been having progressive symptoms in her left leg.  She has trouble getting up a ramp which measures approximately 50 feet in length.  She gets cramping and pain which is alleviated with rest.  She does not have rest pain.  She does not have open wounds.   PAST MEDICAL HISTORY:   Past Medical History:  Diagnosis Date  . Cancer (HCC)    Squamous Cell  . Diabetes mellitus without complication (HCC)   . Hyperlipemia   . Hypertension   . Hypothyroidism      FAMILY HISTORY:   Family History  Problem Relation Age of Onset  . Heart disease Mother   . Hypertension Mother   . Hypertension Father     SOCIAL HISTORY:   Social History   Tobacco Use  . Smoking status: Never Smoker  . Smokeless tobacco: Never Used  Substance Use Topics  . Alcohol use: No    Alcohol/week: 0.0 standard drinks     ALLERGIES:   Allergies  Allergen Reactions  . Bactrim [Sulfamethoxazole-Trimethoprim] Other (See Comments)    Elevates potassium.   . Tape Other (See Comments)    not allergic but causes sensitivity.  . Fluorescein Rash  . Procrit [Epoetin (Alfa)] Rash     CURRENT  MEDICATIONS:   Current Outpatient Medications  Medication Sig Dispense Refill  . acetaminophen (TYLENOL) 500 MG tablet Take 500 mg by mouth daily as needed for mild pain or headache.    . Ascorbic Acid (VITAMIN C PO) Take 1 tablet by mouth daily.    . aspirin EC 81 MG tablet Take 81 mg by mouth daily.    . atorvastatin (LIPITOR) 80 MG tablet Take 1 tablet (80 mg total) by mouth daily. 30 tablet 6  . calcium gluconate 500 MG tablet Take 1 tablet by mouth 2 (two) times daily.    . cholecalciferol (VITAMIN D) 1000 UNITS tablet Take 1,000 Units by mouth daily.    . fludrocortisone (FLORINEF) 0.1 MG tablet Take 0.5 tablets by mouth daily.    . HUMALOG 100 UNIT/ML injection as directed.    . iron polysaccharides (FERREX 150) 150 MG capsule Take 150 mg by mouth daily.    . levothyroxine (SYNTHROID, LEVOTHROID) 100 MCG tablet Take 1 tablet by mouth daily.    . magnesium chloride (SLOW-MAG) 64 MG TBEC SR tablet Take 1 tablet by mouth 3 (three) times daily.     . Multiple Vitamin (MULTIVITAMIN WITH MINERALS) TABS tablet Take 1 tablet by mouth daily.    . Multiple Vitamins-Minerals (PRESERVISION AREDS 2 PO) Take 1 tablet by mouth daily.     . Omega-3 Fatty   Acids (FISH OIL) 1000 MG CAPS Take 1 capsule by mouth daily.    . predniSONE (DELTASONE) 5 MG tablet Take 5 mg by mouth daily with breakfast.    . SODIUM BICARBONATE PO Take by mouth daily. 10 gram    . VELTASSA 8.4 g packet MIX 8.4 GRAM IN ONE GLASS OF WATER AND DRINK EVERY MONDAY, WEDENESDAY AND FRIDAY, TAKE AT LEAST THREE HOURS APART FROM OTHER MEDICATIONS  5   No current facility-administered medications for this visit.    REVIEW OF SYSTEMS:   [X] denotes positive finding, [ ] denotes negative finding Cardiac  Comments:  Chest pain or chest pressure:    Shortness of breath upon exertion: x   Short of breath when lying flat:    Irregular heart rhythm:        Vascular    Pain in calf, thigh, or hip brought on by ambulation: x   Pain in  feet at night that wakes you up from your sleep:     Blood clot in your veins:    Leg swelling:         Pulmonary    Oxygen at home:    Productive cough:     Wheezing:         Neurologic    Sudden weakness in arms or legs:     Sudden numbness in arms or legs:     Sudden onset of difficulty speaking or slurred speech:    Temporary loss of vision in one eye:     Problems with dizziness:         Gastrointestinal    Blood in stool:     Vomited blood:         Genitourinary    Burning when urinating:     Blood in urine:        Psychiatric    Major depression:         Hematologic    Bleeding problems:    Problems with blood clotting too easily:        Skin    Rashes or ulcers:        Constitutional    Fever or chills:      PHYSICAL EXAM:   Vitals:   05/18/19 1424  BP: (!) 173/77  Pulse: 72  Resp: 20  Temp: 97.7 F (36.5 C)  SpO2: 96%  Weight: 126 lb (57.2 kg)  Height: 5' 2" (1.575 m)    GENERAL: The patient is a well-nourished female, in no acute distress. The vital signs are documented above. CARDIAC: There is a regular rate and rhythm.  VASCULAR: Nonpalpable pedal pulses PULMONARY: Non-labored respirations ABDOMEN: Soft and non-tender  MUSCULOSKELETAL: There are no major deformities or cyanosis. NEUROLOGIC: No focal weakness or paresthesias are detected. SKIN: There are no ulcers or rashes noted. PSYCHIATRIC: The patient has a normal affect.  STUDIES:   I have reviewed the following studies: +-------+-----------+-----------+------------+------------+  ABI/TBIToday's ABIToday's TBIPrevious ABIPrevious TBI  +-------+-----------+-----------+------------+------------+  Right [place]     0.73    0.99    0.35      +-------+-----------+-----------+------------+------------+  Left  [place]     0.28    0.81    0.87      +-------+-----------+-----------+------------+------------+   LE duplex: Right: Patent  femoropopliteal bypass graft with native inflow velocities  suggesive of a 30-49% stenosis.  The proximal anastomosis demonstrates elevated velocities in the 50-74%  stenosis range.  The proximal bypass graft velocities suggest a 50-74% stenosis.     The adjacent outflow artery is observed with a mild amount of disease;  however, several centimeters from the anastomosis, the native artery  velocities suggest 75-99% stenosis.   MEDICAL ISSUES:   Left leg claudication: The patient has dampened waveforms in the left leg suggestive of atherosclerosis and vascular insufficiency as the etiology of her leg pain.  This will need to be evaluated with angiography.  Right leg bypass graft stenosis: Patient has elevated velocities distal to her bypass graft which put her bypass at risk for failure.  I have recommended that we proceed with angiography.  The first step will be from a left femoral approach to evaluate her bypass graft and to intervene in any potential areas of concern.  At the same time I will get pictures of her left leg to plan our next intervention whether it be bypass grafting or stenting.  She is very concerned about her renal insufficiency.  We will exercise extreme caution limiting her contrast and making sure that she stays hydrated.  The patient will schedule this on a Tuesday in the near future.    Wells Brix Brearley, IV, MD, FACS [facility] [place] Tel [phone] Pager [phone] 

## 2019-05-19 ENCOUNTER — Other Ambulatory Visit: Payer: Self-pay

## 2019-05-20 DIAGNOSIS — E1065 Type 1 diabetes mellitus with hyperglycemia: Secondary | ICD-10-CM | POA: Diagnosis not present

## 2019-05-20 DIAGNOSIS — Z713 Dietary counseling and surveillance: Secondary | ICD-10-CM | POA: Diagnosis not present

## 2019-05-20 DIAGNOSIS — N390 Urinary tract infection, site not specified: Secondary | ICD-10-CM | POA: Diagnosis not present

## 2019-05-20 DIAGNOSIS — Z6822 Body mass index (BMI) 22.0-22.9, adult: Secondary | ICD-10-CM | POA: Diagnosis not present

## 2019-05-22 DIAGNOSIS — E1021 Type 1 diabetes mellitus with diabetic nephropathy: Secondary | ICD-10-CM | POA: Diagnosis not present

## 2019-05-22 DIAGNOSIS — J8410 Pulmonary fibrosis, unspecified: Secondary | ICD-10-CM | POA: Diagnosis not present

## 2019-05-25 ENCOUNTER — Other Ambulatory Visit: Payer: Self-pay

## 2019-05-25 ENCOUNTER — Ambulatory Visit (INDEPENDENT_AMBULATORY_CARE_PROVIDER_SITE_OTHER): Payer: Medicare Other | Admitting: Cardiology

## 2019-05-25 ENCOUNTER — Encounter: Payer: Self-pay | Admitting: Cardiology

## 2019-05-25 VITALS — BP 138/50 | HR 77 | Ht 62.0 in | Wt 126.0 lb

## 2019-05-25 DIAGNOSIS — E782 Mixed hyperlipidemia: Secondary | ICD-10-CM

## 2019-05-25 DIAGNOSIS — I2510 Atherosclerotic heart disease of native coronary artery without angina pectoris: Secondary | ICD-10-CM | POA: Diagnosis not present

## 2019-05-25 DIAGNOSIS — I779 Disorder of arteries and arterioles, unspecified: Secondary | ICD-10-CM | POA: Diagnosis not present

## 2019-05-25 NOTE — Progress Notes (Signed)
Clinical Summary Ms. Walthers is a 69 y.o.female seen today for follow up of the following medical problems.   1. CAD - previously followed by [facility]. 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  - LVEF March 2011 reported normal LVEF. Echo 04/2011 LVEF 55-60%. - 12/2014 Lexiscan without ischemia - 08/2016 echo LVEF 60-65%, grade II diastolic dysfunction   - no recent chest pain. No SOB or DOE - compliant with meds  2. PAD - hx of right common femoral to popliteal bypass in [place] - followed by vascular  - recent issues with claudication, plans for aniography per vascular.    3. 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 Adrenal insufficiency - followed by endocrine   6. CKD - followed at [facility] 1.7 by last check  7. Chronic diastolic HF - echo 01/6107 LVEF 60-65%, grade II diastolic dysfunction. Mild to moderate RV dysfunction. PASP 23. - no recent issues with edema, actually on florinef for adrenal insufficiency history.   Past Medical History:  Diagnosis Date  . Cancer (HCC)    Squamous Cell  . Diabetes mellitus without complication ([place])   . Hyperlipemia   . Hypertension   . Hypothyroidism      Allergies  Allergen Reactions  . Bactrim [Sulfamethoxazole-Trimethoprim] Other (See Comments)    Elevates potassium.   . Tape Other (See Comments)    not allergic but causes sensitivity.  . Fluorescein Rash  . Procrit [Epoetin (Alfa)] Rash     Current Outpatient Medications  Medication Sig Dispense Refill  . acetaminophen (TYLENOL) 500 MG tablet Take 500 mg by mouth daily as needed for mild pain or headache.    . Ascorbic Acid (VITAMIN C PO) Take 1 tablet by mouth daily.    Marland Kitchen aspirin EC 81 MG tablet Take 81 mg by mouth daily.    Marland Kitchen atorvastatin (LIPITOR) 80 MG  tablet Take 1 tablet (80 mg total) by mouth daily. 30 tablet 6  . calcium gluconate 500 MG tablet Take 1 tablet by mouth 2 (two) times daily.    . cholecalciferol (VITAMIN D) 1000 UNITS tablet Take 1,000 Units by mouth daily.    . fludrocortisone (FLORINEF) 0.1 MG tablet Take 0.5 tablets by mouth daily.    Marland Kitchen HUMALOG 100 UNIT/ML injection as directed.    . iron polysaccharides (FERREX 150) 150 MG capsule Take 150 mg by mouth daily.    Marland Kitchen levothyroxine (SYNTHROID, LEVOTHROID) 100 MCG tablet Take 1 tablet by mouth daily.    . magnesium chloride (SLOW-MAG) 64 MG TBEC SR tablet Take 1 tablet by mouth 3 (three) times daily.     . Multiple Vitamin (MULTIVITAMIN WITH MINERALS) TABS tablet Take 1 tablet by mouth daily.    . Multiple Vitamins-Minerals (PRESERVISION AREDS 2 PO) Take 1 tablet by mouth daily.     . Omega-3 Fatty Acids (FISH OIL) 1000 MG CAPS Take 1 capsule by mouth daily.    . predniSONE (DELTASONE) 5 MG tablet Take 5 mg by mouth daily with breakfast.    . SODIUM BICARBONATE PO Take by mouth daily. 10 gram    . VELTASSA 8.4 g packet MIX 8.4 GRAM IN ONE GLASS OF WATER AND DRINK EVERY MONDAY, [place]  APART FROM OTHER MEDICATIONS  5   No current facility-administered medications for this visit.     Past Surgical History:  Procedure Laterality Date  . APPENDECTOMY    . CATARACT EXTRACTION Bilateral   . CORONARY ARTERY BYPASS GRAFT    . TONSILLECTOMY    . YAG LASER APPLICATION Right 3/55/7322   Procedure: YAG LASER APPLICATION;  Surgeon: Williams Che, MD;  Location: [facility];  Service: Ophthalmology;  Laterality: Right;     Allergies  Allergen Reactions  . Bactrim [Sulfamethoxazole-Trimethoprim] Other (See Comments)    Elevates potassium.   . Tape Other (See Comments)    not allergic but causes sensitivity.  . Fluorescein Rash  . Procrit [Epoetin (Alfa)] Rash      Family History  Problem Relation Age of Onset  . Heart disease Mother    . Hypertension Mother   . Hypertension Father      Social History Ms. Limburg reports that she has never smoked. She has never used smokeless tobacco. Ms. Karapetian reports no history of alcohol use.   Review of Systems CONSTITUTIONAL: No weight loss, fever, chills, weakness or fatigue.  HEENT: Eyes: No visual loss, blurred vision, double vision or yellow sclerae.No hearing loss, sneezing, congestion, runny nose or sore throat.  SKIN: No rash or itching.  CARDIOVASCULAR: per hpi RESPIRATORY: No shortness of breath, cough or sputum.  GASTROINTESTINAL: No anorexia, nausea, vomiting or diarrhea. No abdominal pain or blood.  GENITOURINARY: No burning on urination, no polyuria NEUROLOGICAL: No headache, dizziness, syncope, paralysis, ataxia, numbness or tingling in the extremities. No change in bowel or bladder control.  MUSCULOSKELETAL: No muscle, back pain, joint pain or stiffness.  LYMPHATICS: No enlarged nodes. No history of splenectomy.  PSYCHIATRIC: No history of depression or anxiety.  ENDOCRINOLOGIC: No reports of sweating, cold or heat intolerance. No polyuria or polydipsia.  Marland Kitchen   Physical Examination Today's Vitals   05/25/19 1410  BP: (!) 138/50  Pulse: 77  SpO2: 97%  Weight: 126 lb (57.2 kg)  Height: 5\' 2"  (1.575 m)   Body mass index is 23.05 kg/m.  Gen: resting comfortably, no acute distress HEENT: no scleral icterus, pupils equal round and reactive, no palptable cervical adenopathy,  CV: RRR, no m/r/g, no jvd Resp: Clear to auscultation bilaterally GI: abdomen is soft, non-tender, non-distended, normal bowel sounds, no hepatosplenomegaly MSK: extremities are warm, no edema.  Skin: warm, no rash Neuro:  no focal deficits Psych: appropriate affect   Diagnostic Studies  04/2011 Echo FINDINGS:  LEFT VENTRICLE The left ventricular size is normal. There is normal left ventricular wall  thickness. Left ventricular systolic function is normal. LV ejection  fraction  = 55-60%. Left ventricular filling pattern is indeterminate. The left  ventricular wall motion is normal. LV WALL MOTION -  RIGHT VENTRICLE The right ventricle is normal in size and function. The right ventricular  systolic function is normal. The right ventricular wall motion is normal.  LEFT ATRIUM The left atrium is borderline dilated.  RIGHT ATRIUM  Right atrial size is normal. - AORTIC VALVE The aortic valve is normal in structure and function. The aortic valve opens  well. Trace (trivial) aortic regurgitation. - MITRAL VALVE The mitral valve leaflets appear normal. There is no evidence of stenosis,  fluttering, or prolapse. There is mild mitral regurgitation. - TRICUSPID VALVE Structurally normal tricuspid valve. There is mild tricuspid regurgitation. - PULMONIC VALVE The pulmonic valve is normal in structure and function. Trace pulmonic  valvular regurgitation. - ARTERIES  The aortic root is normal size. - VENOUS Pulmonary venous flow pattern is blunted. IVC size was normal. - EFFUSION There is no pericardial effusion. - - MMode/2D Measurements &Calculations IVSd: 0.95 cm LVIDd: 3.9 cm LVPWd: 0.99 cm LVIDs: 2.4 cm LA dim: 3.8 cm Ao root: 2.5 cm EDV(MOD-sp4): 24.0 ml ESV(MOD-sp4): 11.0 ml LVOT diam: 1.5 cm LAV(MOD-sp4): 41.5 ml Doppler Measurements &Calculations MV E max vel: 114.0 cm/sec MV A max vel: 82.4 cm/sec MV E/A: 1.4 Med Peak E' Vel: 7.6 cm/sec Lat Peak E' Vel: 12.2 cm/sec E/Lat E`: 9.4 E/Med E`: 15.0 MV dec time: 0.16 sec SV(LVOT): 45.4 ml Ao mean PG: 7.1 mmHg AVA (VTI): 1.3 cm2 LV V1 VTI: 27.2 cm TR max vel: 230.8 cm/sec TR max PG: 21.3 mmHg RVSP(TR): 26.3 mmHg RAP systole: 5.0 mmHg  11/2014 echo Study Conclusions  - Left ventricle: The cavity size was normal. Systolic function was normal. The estimated ejection fraction was in the range of 60% to 65%. Wall motion was normal; there were no regional  wall motion abnormalities. Diastolic dysfunction, grade indeterminate. Medial annular velocity is slightly low with elevated E/e&' ratio of 18, indicative of elevated filling pressures. Mild to moderate concentric left ventricular hypertrophy. - Aortic valve: Moderately calcified annulus. Trileaflet. There was mild regurgitation. - Mitral valve: Mildly calcified annulus. Mildly thickened leaflets . There was trivial regurgitation. - Right ventricle: Systolic function was normal. TAPSE: 17.6 mm . - Tricuspid valve: There was mild-moderate regurgitation. - Pulmonary arteries: Systolic pressure was mildly increased. PA peak pressure: 35 mm Hg (S).   12/2014 Lexiscan MPI  No diagnostic ST segment abnormalities. Rare PACs.  Very small fixed perfusion defect at the inferior apex most consistent with attenuation artifact rather than scar. No ischemic findings.  This is a low risk study.  Nuclear stress EF: 70%.  08/2016 echo Study Conclusions  - Left ventricle: The cavity size was normal. Wall thickness was normal. Systolic function was normal. The estimated ejection fraction was in the range of 60% to 65%. Features are consistent with a pseudonormal left ventricular filling pattern, with concomitant abnormal relaxation and increased filling pressure (grade 2 diastolic dysfunction). Doppler parameters are consistent with high ventricular filling pressure. - Aortic valve: Moderately calcified annulus. Trileaflet. There was mild regurgitation. - Mitral valve: Calcified annulus. There was mild regurgitation. - Right ventricle: Systolic function was mildly to moderately reduced.     Assessment and Plan   1. CAD - no symptoms, continue current meds - no ACE/ARB due to renal dysfunction - EKG shows SR, no acute ischemic chagnes   2. Hyperlipidemia - at goal, continue statin.       Arnoldo Lenis, M.D.

## 2019-05-25 NOTE — Patient Instructions (Signed)

## 2019-06-02 DIAGNOSIS — L851 Acquired keratosis [keratoderma] palmaris et plantaris: Secondary | ICD-10-CM | POA: Diagnosis not present

## 2019-06-02 DIAGNOSIS — E1342 Other specified diabetes mellitus with diabetic polyneuropathy: Secondary | ICD-10-CM | POA: Diagnosis not present

## 2019-06-02 DIAGNOSIS — B351 Tinea unguium: Secondary | ICD-10-CM | POA: Diagnosis not present

## 2019-06-02 DIAGNOSIS — M25569 Pain in unspecified knee: Secondary | ICD-10-CM | POA: Diagnosis not present

## 2019-06-05 ENCOUNTER — Other Ambulatory Visit: Payer: Self-pay

## 2019-06-05 ENCOUNTER — Other Ambulatory Visit (HOSPITAL_COMMUNITY)
Admission: RE | Admit: 2019-06-05 | Discharge: 2019-06-05 | Disposition: A | Discharge status: Home / Self Care | Payer: Medicare Other | Source: Ambulatory Visit | Attending: Surgery | Admitting: Surgery

## 2019-06-05 DIAGNOSIS — Z01812 Encounter for preprocedural laboratory examination: Secondary | ICD-10-CM | POA: Insufficient documentation

## 2019-06-05 DIAGNOSIS — Z20822 Contact with and (suspected) exposure to covid-19: Secondary | ICD-10-CM | POA: Insufficient documentation

## 2019-06-05 LAB — SARS CORONAVIRUS 2 (TAT 6-24 HRS): SARS Coronavirus 2: NEGATIVE

## 2019-06-09 ENCOUNTER — Encounter (HOSPITAL_COMMUNITY)
Admission: RE | Disposition: A | Discharge status: Home / Self Care | Payer: Self-pay | Source: Home / Self Care | Attending: Surgery

## 2019-06-09 ENCOUNTER — Other Ambulatory Visit: Payer: Self-pay

## 2019-06-09 ENCOUNTER — Inpatient Hospital Stay (HOSPITAL_COMMUNITY)
Admission: RE | Admit: 2019-06-09 | Discharge: 2019-06-11 | DRG: 908 | Disposition: A | Discharge status: Home / Self Care | Payer: Medicare Other | Attending: Surgery | Admitting: Surgery

## 2019-06-09 DIAGNOSIS — E039 Hypothyroidism, unspecified: Secondary | ICD-10-CM | POA: Diagnosis present

## 2019-06-09 DIAGNOSIS — S301XXA Contusion of abdominal wall, initial encounter: Secondary | ICD-10-CM | POA: Diagnosis present

## 2019-06-09 DIAGNOSIS — Z794 Long term (current) use of insulin: Secondary | ICD-10-CM

## 2019-06-09 DIAGNOSIS — Z7982 Long term (current) use of aspirin: Secondary | ICD-10-CM

## 2019-06-09 DIAGNOSIS — I70213 Atherosclerosis of native arteries of extremities with intermittent claudication, bilateral legs: Secondary | ICD-10-CM | POA: Diagnosis not present

## 2019-06-09 DIAGNOSIS — Z882 Allergy status to sulfonamides status: Secondary | ICD-10-CM

## 2019-06-09 DIAGNOSIS — E7800 Pure hypercholesterolemia, unspecified: Secondary | ICD-10-CM | POA: Diagnosis present

## 2019-06-09 DIAGNOSIS — Y838 Other surgical procedures as the cause of abnormal reaction of the patient, or of later complication, without mention of misadventure at the time of the procedure: Secondary | ICD-10-CM | POA: Diagnosis present

## 2019-06-09 DIAGNOSIS — Z888 Allergy status to other drugs, medicaments and biological substances status: Secondary | ICD-10-CM

## 2019-06-09 DIAGNOSIS — E1151 Type 2 diabetes mellitus with diabetic peripheral angiopathy without gangrene: Secondary | ICD-10-CM | POA: Diagnosis present

## 2019-06-09 DIAGNOSIS — I739 Peripheral vascular disease, unspecified: Secondary | ICD-10-CM | POA: Diagnosis present

## 2019-06-09 DIAGNOSIS — N183 Chronic kidney disease, stage 3 unspecified: Secondary | ICD-10-CM | POA: Diagnosis present

## 2019-06-09 DIAGNOSIS — I2510 Atherosclerotic heart disease of native coronary artery without angina pectoris: Secondary | ICD-10-CM | POA: Diagnosis present

## 2019-06-09 DIAGNOSIS — I708 Atherosclerosis of other arteries: Secondary | ICD-10-CM | POA: Diagnosis present

## 2019-06-09 DIAGNOSIS — L7622 Postprocedural hemorrhage and hematoma of skin and subcutaneous tissue following other procedure: Principal | ICD-10-CM | POA: Diagnosis present

## 2019-06-09 DIAGNOSIS — Z79890 Hormone replacement therapy (postmenopausal): Secondary | ICD-10-CM

## 2019-06-09 DIAGNOSIS — D62 Acute posthemorrhagic anemia: Secondary | ICD-10-CM | POA: Diagnosis not present

## 2019-06-09 DIAGNOSIS — Z7952 Long term (current) use of systemic steroids: Secondary | ICD-10-CM

## 2019-06-09 DIAGNOSIS — Z9641 Presence of insulin pump (external) (internal): Secondary | ICD-10-CM | POA: Diagnosis present

## 2019-06-09 DIAGNOSIS — I129 Hypertensive chronic kidney disease with stage 1 through stage 4 chronic kidney disease, or unspecified chronic kidney disease: Secondary | ICD-10-CM | POA: Diagnosis present

## 2019-06-09 DIAGNOSIS — Z79899 Other long term (current) drug therapy: Secondary | ICD-10-CM

## 2019-06-09 DIAGNOSIS — E785 Hyperlipidemia, unspecified: Secondary | ICD-10-CM | POA: Diagnosis present

## 2019-06-09 DIAGNOSIS — R55 Syncope and collapse: Secondary | ICD-10-CM | POA: Diagnosis present

## 2019-06-09 DIAGNOSIS — Z8249 Family history of ischemic heart disease and other diseases of the circulatory system: Secondary | ICD-10-CM

## 2019-06-09 DIAGNOSIS — E875 Hyperkalemia: Secondary | ICD-10-CM | POA: Diagnosis not present

## 2019-06-09 DIAGNOSIS — Z951 Presence of aortocoronary bypass graft: Secondary | ICD-10-CM

## 2019-06-09 DIAGNOSIS — N179 Acute kidney failure, unspecified: Secondary | ICD-10-CM | POA: Diagnosis not present

## 2019-06-09 DIAGNOSIS — E1122 Type 2 diabetes mellitus with diabetic chronic kidney disease: Secondary | ICD-10-CM | POA: Diagnosis present

## 2019-06-09 HISTORY — PX: ABDOMINAL AORTOGRAM W/LOWER EXTREMITY: CATH118223

## 2019-06-09 HISTORY — DX: Peripheral vascular disease, unspecified: I73.9

## 2019-06-09 HISTORY — PX: PERIPHERAL VASCULAR INTERVENTION: CATH118257

## 2019-06-09 LAB — POCT I-STAT, CHEM 8
BUN: 43 mg/dL — ABNORMAL HIGH (ref 8–23)
Calcium, Ion: 1.32 mmol/L (ref 1.15–1.40)
Chloride: 99 mmol/L (ref 98–111)
Creatinine, Ser: 1.8 mg/dL — ABNORMAL HIGH (ref 0.44–1.00)
Glucose, Bld: 172 mg/dL — ABNORMAL HIGH (ref 70–99)
HCT: 38 % (ref 36.0–46.0)
Hemoglobin: 12.9 g/dL (ref 12.0–15.0)
Potassium: 4.4 mmol/L (ref 3.5–5.1)
Sodium: 139 mmol/L (ref 135–145)
TCO2: 32 mmol/L (ref 22–32)

## 2019-06-09 LAB — CBC
HCT: 35.5 % — ABNORMAL LOW (ref 36.0–46.0)
Hemoglobin: 11 g/dL — ABNORMAL LOW (ref 12.0–15.0)
MCH: 30.1 pg (ref 26.0–34.0)
MCHC: 31 g/dL (ref 30.0–36.0)
MCV: 97 fL (ref 80.0–100.0)
Platelets: 239 10*3/uL (ref 150–400)
RBC: 3.66 MIL/uL — ABNORMAL LOW (ref 3.87–5.11)
RDW: 14.1 % (ref 11.5–15.5)
WBC: 8.4 10*3/uL (ref 4.0–10.5)
nRBC: 0 % (ref 0.0–0.2)

## 2019-06-09 LAB — GLUCOSE, CAPILLARY
Glucose-Capillary: 120 mg/dL — ABNORMAL HIGH (ref 70–99)
Glucose-Capillary: 93 mg/dL (ref 70–99)
Glucose-Capillary: 58 mg/dL — ABNORMAL LOW (ref 70–99)
Glucose-Capillary: 94 mg/dL (ref 70–99)

## 2019-06-09 LAB — POCT ACTIVATED CLOTTING TIME: Activated Clotting Time: 208 seconds

## 2019-06-09 SURGERY — ABDOMINAL AORTOGRAM W/LOWER EXTREMITY
Anesthesia: LOCAL

## 2019-06-09 MED ORDER — IODIXANOL 320 MG/ML IV SOLN
INTRAVENOUS | Status: DC | PRN
  Administered 2019-06-09: 36 mL via INTRA_ARTERIAL

## 2019-06-09 MED ORDER — ONDANSETRON HCL 4 MG/2ML IJ SOLN
4.0000 mg | Freq: Four times a day (QID) | INTRAMUSCULAR | Status: DC | PRN
  Administered 2019-06-09 (×2): 4 mg via INTRAVENOUS
  Filled 2019-06-09 (×2): qty 2

## 2019-06-09 MED ORDER — OXYCODONE HCL 5 MG PO TABS: 5.0000 mg | ORAL_TABLET | ORAL | Status: DC | PRN

## 2019-06-09 MED ORDER — CLOPIDOGREL BISULFATE 75 MG PO TABS
75.0000 mg | ORAL_TABLET | Freq: Every day | ORAL | Status: DC
  Administered 2019-06-10 – 2019-06-11 (×2): 75 mg via ORAL
  Filled 2019-06-09 (×2): qty 1

## 2019-06-09 MED ORDER — HYDRALAZINE HCL 20 MG/ML IJ SOLN: 5.0000 mg | INTRAMUSCULAR | Status: DC | PRN

## 2019-06-09 MED ORDER — SODIUM CHLORIDE 0.9 % WEIGHT BASED INFUSION
1.0000 mL/kg/h | INTRAVENOUS | Status: AC
  Administered 2019-06-09: 1 mL/kg/h via INTRAVENOUS

## 2019-06-09 MED ORDER — HEPARIN (PORCINE) IN NACL 1000-0.9 UT/500ML-% IV SOLN
INTRAVENOUS | Status: AC
  Filled 2019-06-09: qty 500

## 2019-06-09 MED ORDER — SODIUM CHLORIDE 0.9 % IV SOLN: 250.0000 mL | INTRAVENOUS | Status: DC | PRN

## 2019-06-09 MED ORDER — FENTANYL CITRATE (PF) 100 MCG/2ML IJ SOLN
INTRAMUSCULAR | Status: AC
  Filled 2019-06-09: qty 2

## 2019-06-09 MED ORDER — ACETAMINOPHEN 325 MG PO TABS
650.0000 mg | ORAL_TABLET | ORAL | Status: DC | PRN
  Administered 2019-06-10: 650 mg via ORAL
  Filled 2019-06-09: qty 2

## 2019-06-09 MED ORDER — LIDOCAINE HCL (PF) 1 % IJ SOLN
INTRAMUSCULAR | Status: AC
  Filled 2019-06-09: qty 30

## 2019-06-09 MED ORDER — SODIUM CHLORIDE 0.9% FLUSH: 3.0000 mL | INTRAVENOUS | Status: DC | PRN

## 2019-06-09 MED ORDER — CLOPIDOGREL BISULFATE 300 MG PO TABS
ORAL_TABLET | ORAL | Status: AC
  Filled 2019-06-09: qty 1

## 2019-06-09 MED ORDER — CLOPIDOGREL BISULFATE 300 MG PO TABS
ORAL_TABLET | ORAL | Status: DC | PRN
  Administered 2019-06-09: 300 mg via ORAL

## 2019-06-09 MED ORDER — LABETALOL HCL 5 MG/ML IV SOLN: 10.0000 mg | INTRAVENOUS | Status: DC | PRN

## 2019-06-09 MED ORDER — MORPHINE SULFATE (PF) 2 MG/ML IV SOLN
2.0000 mg | INTRAVENOUS | Status: DC | PRN
  Administered 2019-06-09: 2 mg via INTRAVENOUS
  Filled 2019-06-09: qty 1

## 2019-06-09 MED ORDER — CLOPIDOGREL BISULFATE 75 MG PO TABS
75.0000 mg | ORAL_TABLET | Freq: Every day | ORAL | 11 refills | Status: DC
Start: 2019-06-09 — End: 2020-06-15

## 2019-06-09 MED ORDER — LEVOTHYROXINE SODIUM 100 MCG PO TABS
100.0000 ug | ORAL_TABLET | Freq: Every day | ORAL | Status: DC
  Administered 2019-06-10 – 2019-06-11 (×2): 100 ug via ORAL
  Filled 2019-06-09 (×2): qty 1

## 2019-06-09 MED ORDER — ATORVASTATIN CALCIUM 80 MG PO TABS
80.0000 mg | ORAL_TABLET | Freq: Every evening | ORAL | Status: DC
  Administered 2019-06-09 – 2019-06-10 (×2): 80 mg via ORAL
  Filled 2019-06-09 (×2): qty 1

## 2019-06-09 MED ORDER — MIDAZOLAM HCL 2 MG/2ML IJ SOLN
INTRAMUSCULAR | Status: DC | PRN
  Administered 2019-06-09: 2 mg via INTRAVENOUS

## 2019-06-09 MED ORDER — MAGNESIUM CHLORIDE 64 MG PO TBEC
2.0000 | DELAYED_RELEASE_TABLET | Freq: Every day | ORAL | Status: DC
  Administered 2019-06-09 – 2019-06-10 (×2): 128 mg via ORAL
  Filled 2019-06-09 (×2): qty 2

## 2019-06-09 MED ORDER — HEPARIN (PORCINE) IN NACL 1000-0.9 UT/500ML-% IV SOLN
INTRAVENOUS | Status: DC | PRN
  Administered 2019-06-09 (×2): 500 mL

## 2019-06-09 MED ORDER — SODIUM CHLORIDE 0.9 % IV SOLN: INTRAVENOUS | Status: DC

## 2019-06-09 MED ORDER — MORPHINE SULFATE (PF) 2 MG/ML IV SOLN: 2.0000 mg | INTRAVENOUS | Status: DC | PRN

## 2019-06-09 MED ORDER — FENTANYL CITRATE (PF) 100 MCG/2ML IJ SOLN
INTRAMUSCULAR | Status: DC | PRN
  Administered 2019-06-09: 50 ug via INTRAVENOUS

## 2019-06-09 MED ORDER — PREDNISONE 5 MG PO TABS
5.0000 mg | ORAL_TABLET | Freq: Every day | ORAL | Status: DC
  Administered 2019-06-10 – 2019-06-11 (×2): 5 mg via ORAL
  Filled 2019-06-09 (×3): qty 1

## 2019-06-09 MED ORDER — CLOPIDOGREL BISULFATE 75 MG PO TABS: 300.0000 mg | ORAL_TABLET | Freq: Once | ORAL | Status: DC

## 2019-06-09 MED ORDER — POLYSACCHARIDE IRON COMPLEX 150 MG PO CAPS
150.0000 mg | ORAL_CAPSULE | Freq: Every day | ORAL | Status: DC
  Administered 2019-06-09 – 2019-06-10 (×2): 150 mg via ORAL
  Filled 2019-06-09 (×2): qty 1

## 2019-06-09 MED ORDER — MAGNESIUM CHLORIDE 64 MG PO TBEC
1.0000 | DELAYED_RELEASE_TABLET | Freq: Every day | ORAL | Status: DC
  Administered 2019-06-10 – 2019-06-11 (×2): 64 mg via ORAL
  Filled 2019-06-09 (×2): qty 1

## 2019-06-09 MED ORDER — LIDOCAINE HCL (PF) 1 % IJ SOLN
INTRAMUSCULAR | Status: DC | PRN
  Administered 2019-06-09 (×2): 15 mL via INTRADERMAL

## 2019-06-09 MED ORDER — PATIROMER SORBITEX CALCIUM 8.4 G PO PACK
8.4000 g | PACK | ORAL | Status: DC
  Administered 2019-06-10: 8.4 g via ORAL
  Filled 2019-06-09 (×2): qty 1

## 2019-06-09 MED ORDER — SODIUM CHLORIDE 0.9% FLUSH: 3.0000 mL | Freq: Two times a day (BID) | INTRAVENOUS | Status: DC

## 2019-06-09 MED ORDER — SODIUM CHLORIDE 0.9 % WEIGHT BASED INFUSION: 1.0000 mL/kg/h | INTRAVENOUS | Status: AC

## 2019-06-09 MED ORDER — HEPARIN SODIUM (PORCINE) 1000 UNIT/ML IJ SOLN
INTRAMUSCULAR | Status: DC | PRN
  Administered 2019-06-09: 6000 [IU] via INTRAVENOUS
  Administered 2019-06-09: 2000 [IU] via INTRAVENOUS

## 2019-06-09 MED ORDER — FLUDROCORTISONE ACETATE 0.1 MG PO TABS
0.0500 mg | ORAL_TABLET | Freq: Every day | ORAL | Status: DC
  Administered 2019-06-09 – 2019-06-11 (×3): 0.05 mg via ORAL
  Filled 2019-06-09 (×3): qty 0.5

## 2019-06-09 MED ORDER — INSULIN PUMP
Freq: Three times a day (TID) | SUBCUTANEOUS | Status: DC
  Filled 2019-06-09: qty 1

## 2019-06-09 MED ORDER — ASPIRIN EC 81 MG PO TBEC
81.0000 mg | DELAYED_RELEASE_TABLET | Freq: Every day | ORAL | Status: DC
  Administered 2019-06-10 – 2019-06-11 (×2): 81 mg via ORAL
  Filled 2019-06-09 (×2): qty 1

## 2019-06-09 MED ORDER — MIDAZOLAM HCL 2 MG/2ML IJ SOLN
INTRAMUSCULAR | Status: AC
  Filled 2019-06-09: qty 2

## 2019-06-09 MED ORDER — SODIUM BICARBONATE 650 MG PO TABS
650.0000 mg | ORAL_TABLET | Freq: Two times a day (BID) | ORAL | Status: DC
  Administered 2019-06-09 – 2019-06-11 (×4): 650 mg via ORAL
  Filled 2019-06-09 (×4): qty 1

## 2019-06-09 MED ORDER — SODIUM CHLORIDE 0.9% FLUSH
3.0000 mL | Freq: Two times a day (BID) | INTRAVENOUS | Status: DC
  Administered 2019-06-10: 3 mL via INTRAVENOUS

## 2019-06-09 SURGICAL SUPPLY — 21 items
CATH ANGIO 5F BER2 65CM (CATHETERS) ×3 IMPLANT
CATH OMNI FLUSH 5F 65CM (CATHETERS) ×3 IMPLANT
CATH SOFT-VU 4F 65 STRAIGHT (CATHETERS) ×2 IMPLANT
CATH SOFT-VU STRAIGHT 4F 65CM (CATHETERS) ×1
CLOSURE MYNX CONTROL 6F/7F (Vascular Products) ×6 IMPLANT
KIT ENCORE 26 ADVANTAGE (KITS) ×6 IMPLANT
KIT MICROPUNCTURE NIT STIFF (SHEATH) ×3 IMPLANT
KIT PV (KITS) ×3 IMPLANT
SHEATH BRITE TIP 6FR 35CM (SHEATH) ×3 IMPLANT
SHEATH BRITE TIP 7FR 35CM (SHEATH) ×6 IMPLANT
SHEATH PINNACLE 5F 10CM (SHEATH) ×3 IMPLANT
SHEATH PINNACLE 7F 10CM (SHEATH) ×6 IMPLANT
SHEATH PINNACLE ST 6F 45CM (SHEATH) ×3 IMPLANT
SHEATH PROBE COVER 6X72 (BAG) ×3 IMPLANT
STENT VIABAHN 7X29X80 VBX (Permanent Stent) ×6 IMPLANT
TRANSDUCER W/STOPCOCK (MISCELLANEOUS) ×3 IMPLANT
TRAY PV CATH (CUSTOM PROCEDURE TRAY) ×3 IMPLANT
WIRE BENTSON .035X145CM (WIRE) ×9 IMPLANT
WIRE ROSEN-J .035X180CM (WIRE) ×3 IMPLANT
WIRE ROSEN-J .035X260CM (WIRE) ×6 IMPLANT
WIRE TORQFLEX AUST .018X40CM (WIRE) ×3 IMPLANT

## 2019-06-09 NOTE — Progress Notes (Signed)
Blood sugar 225 per patients home dexcom meter. Pt gave bolus per home standards.

## 2019-06-09 NOTE — Progress Notes (Signed)
  Day of Surgery Note    Subjective:  Seen in treatment rm 8. Resting comfortably   Vitals:   06/09/19 1453 06/09/19 1515  BP:  (!) 122/41  Pulse: 78 73  Resp:    Temp:    SpO2: 100% 100%    General: Awake and alert in NAD Extremities:  2+ bil palp DP pulses.  She has old scar of anterior right ankle from prior ankle fracture Cardiac:  RRR Lungs:  nonlabored Abdomen:  bilateral groin puncture sites soft with dressings dry and intact   Assessment/Plan:  This is a 69 y.o. female who is s/p arteriogram with post =procedure right groin bleed controlled with manual pressure   -Stable immediate post-procdure   Risa Grill, PA-C 06/09/2019 3:52 PM [phone]

## 2019-06-09 NOTE — Op Note (Signed)
Patient name: Kendra Walker MRN: [id number] DOB: Jul 30, 1950 Sex: female  06/09/2019 Pre-operative Diagnosis: bilateral claudication Post-operative diagnosis:  Same Surgeon:  Annamarie Major Procedure Performed:  1.  Ultrasound-guided access, right femoral artery  2.  Ultrasound-guided access, left femoral artery  3.  Abdominal aortogram  4.  Bilateral lower extremity runoff  5.  Stent, right common iliac artery  6.  Stent, left common iliac artery  7.  Conscious sedation, 73 minutes  8.  Bilateral closure device, Mynx   Indications: The patient has a history of a right femoral-popliteal bypass graft.  She is having claudication symptoms in the left leg.  Ultrasound also identified possible stenosis in the bypass graft.  She is here for further evaluation.  She does have chronic renal insufficiency.  She was encouraged to hydrate prior to her procedure and was given extra fluids prior to her procedure.  Procedure:  The patient was identified in the holding area and taken to room 8.  The patient was then placed supine on the table and prepped and draped in the usual sterile fashion.  A time out was called.  Conscious sedation was administered with the use of IV fentanyl and Versed under continuous physician and nurse monitoring.  Heart rate, blood pressure, and oxygen saturation were continuously monitored.  Total sedation time was 73 minutes.  Ultrasound was used to evaluate the left common femoral artery.  It was patent .  A digital ultrasound image was acquired.  A micropuncture needle was used to access the left common femoral artery under ultrasound guidance.  An 018 wire was advanced without resistance and a micropuncture sheath was placed.  The 018 wire was removed and a benson wire was placed.  The micropuncture sheath was exchanged for a 5 french sheath.  An omniflush catheter was advanced over the wire to the level of L-1.  An abdominal angiogram was obtained.  Next, using the  omniflush catheter and a benson wire, the aortic bifurcation was crossed and the catheter was placed into theright external iliac artery and right runoff was obtained.  left runoff was performed via retrograde sheath injections.  Findings:   Aortogram: No evidence of renal artery stenosis.  Infrarenal abdominal aorta is patent without stenosis however it is small in caliber.  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  The right common iliac artery has greater than 50% stenosis and a 40 point drop in blood pressure from the aorta to the proximal external iliac artery.  Right Lower Extremity: Right common femoral profundofemoral artery widely patent.  There is a bypass graft originating in the common femoral artery.  The proximal anastomosis is widely patent.  The distal anastomosis is widely patent.  The distal popliteal artery remains widely patent with two-vessel runoff via the anterior tibial and peroneal artery.  Left Lower Extremity: Left common femoral profundofemoral and superficial femoral artery are patent without stenosis.  The below-knee popliteal artery is patent throughout its course.  The proximal anterior tibial and peroneal artery are widely patent.  Distal images were not obtained  Intervention: After above images were acquired the decision was made to proceed with intervention.  I felt kissing iliac stents were required and so access into the right groin was obtained under ultrasound guidance.  Ultimately a 7 French sheath was advanced into the aorta on the right as well as the left.  The patient was fully heparinized.  7 x 29 VBX stents were inserted and  deployed landing at the aortic bifurcation into the common iliac arteries bilaterally.  Completion imaging showed no residual stenosis.  Both groins were closed with minx device was  Impression:  #1  Hemodynamically significant lesion in the right common iliac artery with a 40 point pressure gradient.  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  Both lesions were treated using 7 x 29 VBX stents.  #2  Right femoral-popliteal bypass graft is widely patent.  #3  No significant superficial femoral artery stenosis on the left.  #4  36 cc of contrast were utilized    V. Annamarie Major, M.D., [facility] [place] Office: [phone] Pager:  [phone]

## 2019-06-09 NOTE — Progress Notes (Signed)
Called to assist with pt. W/ possible hematoma. RFA site was closed with  Mynx. Level 1 not an overt hematoama. rfa site as compared with lfa site was firm and painful to palpation. Manual pressure held x 30 min. Pt. Uncomfortable with manual pressure. BP improved w/ns fluid bolus 200 c/hr. RFA site feels improved post hold. Call bell placed at r hand. Monitoring; no change in dps. Pt rates improvement in pain scale from "10/10- a 3/10".

## 2019-06-09 NOTE — Progress Notes (Signed)
Pt received from PACU, Bilateral groin incision,  Right side hematoma level 2. VSS.  Pt oriented to unit will continue to monitor pt. Blood sugar 93 . Insulin pump on right thigh   Phoebe Sharps, RN

## 2019-06-09 NOTE — Interval H&P Note (Signed)
History and Physical Interval Note:  06/09/2019 7:37 AM  Kendra Walker  has presented today for surgery, with the diagnosis of pad.  The various methods of treatment have been discussed with the patient and family. After consideration of risks, benefits and other options for treatment, the patient has consented to  Procedure(s): ABDOMINAL AORTOGRAM W/LOWER EXTREMITY (N/A) as a surgical intervention.  The patient's history has been reviewed, patient examined, no change in status, stable for surgery.  I have reviewed the patient's chart and labs.  Questions were answered to the patient's satisfaction.     Annamarie Major

## 2019-06-09 NOTE — Progress Notes (Addendum)
Inpatient Diabetes Program Recommendations  AACE/ADA: New Consensus Statement on Inpatient Glycemic Control (2015)  Target Ranges:  Prepandial:   less than 140 mg/dL      Peak postprandial:   less than 180 mg/dL (1-2 hours)      Critically ill patients:  140 - 180 mg/dL   No results found for: GLUCAP, HGBA1C  Review of Glycemic Control Results for Kendra Walker, Kendra Walker (MRN [id number]) as of 06/09/2019 13:25  Ref. Range 06/09/2019 06:02  Glucose Latest Ref Range: 70 - 99 mg/dL 172 (H)   Diabetes history: Type 1 DM Outpatient Diabetes medications: Humalog per [employer] 670 G Current orders for Inpatient glycemic control: none  Inpatient Diabetes Program Recommendations:    Noted patient vagal episode, called Denice Paradise, RN to determine CBG through Dexcom. Result in 140's mg/dL. Patient to be admitted and safely able to operate.    Consider adding insulin pump order set TID, HS and 0200.   Patient confirms that rates are still consistent with previous appointment (see below) with outpatient endocrinology on 05/20/19. Patient uses a Medtronic insulin pump with Humalog insulin as an outpatient.   Current insulin pump settings are as follows:  Basal  (1) Basal Time: 0000 (1) Basal Rate (Units/Hr): 0.1  (2) Basal Time: 0400 (2) Basal Rate (Units/Hr): 0.2  (3) Basal Time: 0800 (3) Basal Rate (Units/Hr): 0.6  (4) Basal Time: 1200 (4) Basal Rate (Units/Hr): 0.725  (5) Basal Time: 2300 (5) Basal Rate (Units/Hr): 0.15   Insulin:Carb (1) ICR Time: 0000  (2) ICR Time: 0700 (2) ICR : 11  (3) ICR Time: 1200 (3) ICR: 10  (4) ICR Time: 1500 (4) ICR: 10   Sensitivity (1) ISF Time: 0000 (1) ISF: 90  (2) ISF Time: 0800 (2) ISF: 75   Target (1) BG Time: 0000 (1) Target BG (mg/dL): 150  (2) BG Time: 0700 (2) Target BG (mg/dL): 120  (3) BG Time: 2200 (3) Target BG (mg/dL): 150     NURSING: Once insulin pump order set is ordered please print off the Patient insulin pump contract and flow sheet. The  insulin pump contract should be signed by the patient and then placed in the chart. The patient insulin pump flow sheet will be completed by the patient at the bedside and the RN caring for the patient will use the patient's flow sheet to document in the [facility]. RN will need to complete the Nursing Insulin Pump Flowsheet at least once a shift. Patient will need to keep extra insulin pump supplies at the bedside at all times.   Thanks, Bronson Curb, MSN, RNC-OB Diabetes Coordinator [phone] (8a-5p)

## 2019-06-09 NOTE — Progress Notes (Signed)
Patient had a syncopal episode and also developed a right groin hematoma.  Manual pressure was held on the groin, and it is now soft.  She remains tender in the right groin.  Hb dropped 1 point to 11.  She will need to be admitted for post procedure bleed.  Kendra Walker

## 2019-06-09 NOTE — Progress Notes (Signed)
Pt assisted to wheelchair to go to bathroom.  Pt appeared to have vagal episode and was returned to bed and placed in [place], O2 applied.  On viewing bilateral groins right groin appears swollen slightly and hard area noted above.  Pressure applied and MS 2 mg given to help with pain from pressure.

## 2019-06-09 NOTE — Discharge Instructions (Signed)
° °  [facility] [place] ° °Discharge Instructions ° °Lower Extremity Angiogram; Angioplasty/Stenting ° °Please refer to the following instructions for your post-procedure care. Your surgeon or physician assistant will discuss any changes with you. ° °Activity ° °Avoid lifting more than 8 pounds (1 gallons of milk) for 72 hours (3 days) after your procedure. You may walk as much as you can tolerate. It's OK to drive after 72 hours. ° °Bathing/Showering ° °You may shower the day after your procedure. If you have a bandage, you may remove it at 24- 48 hours. Clean your incision site with mild soap and water. Pat the area dry with a clean towel. ° °Diet ° °Resume your pre-procedure diet. There are no special food restrictions following this procedure. All patients with peripheral vascular disease should follow a low fat/low cholesterol diet. In order to heal from your surgery, it is CRITICAL to get adequate nutrition. Your body requires vitamins, minerals, and protein. Vegetables are the best source of vitamins and minerals. Vegetables also provide the perfect balance of protein. Processed food has little nutritional value, so try to avoid this. ° °Medications ° °Resume taking all of your medications unless your doctor tells you not to. If your incision is causing pain, you may take over-the-counter pain relievers such as acetaminophen (Tylenol) ° °Follow Up ° °Follow up will be arranged at the time of your procedure. You may have an office visit scheduled or may be scheduled for surgery. Ask your surgeon if you have any questions. ° °Please call us immediately for any of the following conditions: °•Severe or worsening pain your legs or feet at rest or with walking. °•Increased pain, redness, drainage at your groin puncture site. °•Fever of 101 degrees or higher. °•If you have any mild or slow bleeding from your puncture site: lie down, apply firm constant pressure over the area with a piece of  gauze or a clean wash cloth for 30 minutes- no peeking!, call 911 right away if you are still bleeding after 30 minutes, or if the bleeding is heavy and unmanageable. ° °Reduce your risk factors of vascular disease: ° °Stop smoking. If you would like help call [facility] at 1-800-QUIT-NOW (1-800-784-8669) or [facility] at 336-586-4000. °Manage your cholesterol °Maintain a desired weight °Control your diabetes °Keep your blood pressure down ° °If you have any questions, please call the office at [phone] ° °

## 2019-06-10 DIAGNOSIS — L7622 Postprocedural hemorrhage and hematoma of skin and subcutaneous tissue following other procedure: Secondary | ICD-10-CM | POA: Diagnosis present

## 2019-06-10 DIAGNOSIS — Z8249 Family history of ischemic heart disease and other diseases of the circulatory system: Secondary | ICD-10-CM | POA: Diagnosis not present

## 2019-06-10 DIAGNOSIS — I708 Atherosclerosis of other arteries: Secondary | ICD-10-CM | POA: Diagnosis present

## 2019-06-10 DIAGNOSIS — N179 Acute kidney failure, unspecified: Secondary | ICD-10-CM | POA: Diagnosis not present

## 2019-06-10 DIAGNOSIS — N183 Chronic kidney disease, stage 3 unspecified: Secondary | ICD-10-CM | POA: Diagnosis present

## 2019-06-10 DIAGNOSIS — E1151 Type 2 diabetes mellitus with diabetic peripheral angiopathy without gangrene: Secondary | ICD-10-CM | POA: Diagnosis present

## 2019-06-10 DIAGNOSIS — R888 Abnormal findings in other body fluids and substances: Secondary | ICD-10-CM | POA: Diagnosis not present

## 2019-06-10 DIAGNOSIS — Z79899 Other long term (current) drug therapy: Secondary | ICD-10-CM | POA: Diagnosis not present

## 2019-06-10 DIAGNOSIS — Z888 Allergy status to other drugs, medicaments and biological substances status: Secondary | ICD-10-CM | POA: Diagnosis not present

## 2019-06-10 DIAGNOSIS — L7632 Postprocedural hematoma of skin and subcutaneous tissue following other procedure: Secondary | ICD-10-CM | POA: Diagnosis not present

## 2019-06-10 DIAGNOSIS — E875 Hyperkalemia: Secondary | ICD-10-CM | POA: Diagnosis not present

## 2019-06-10 DIAGNOSIS — E039 Hypothyroidism, unspecified: Secondary | ICD-10-CM | POA: Diagnosis present

## 2019-06-10 DIAGNOSIS — Y838 Other surgical procedures as the cause of abnormal reaction of the patient, or of later complication, without mention of misadventure at the time of the procedure: Secondary | ICD-10-CM | POA: Diagnosis present

## 2019-06-10 DIAGNOSIS — Z9641 Presence of insulin pump (external) (internal): Secondary | ICD-10-CM | POA: Diagnosis present

## 2019-06-10 DIAGNOSIS — Z7952 Long term (current) use of systemic steroids: Secondary | ICD-10-CM | POA: Diagnosis not present

## 2019-06-10 DIAGNOSIS — Z882 Allergy status to sulfonamides status: Secondary | ICD-10-CM | POA: Diagnosis not present

## 2019-06-10 DIAGNOSIS — D62 Acute posthemorrhagic anemia: Secondary | ICD-10-CM | POA: Diagnosis not present

## 2019-06-10 DIAGNOSIS — I129 Hypertensive chronic kidney disease with stage 1 through stage 4 chronic kidney disease, or unspecified chronic kidney disease: Secondary | ICD-10-CM | POA: Diagnosis present

## 2019-06-10 DIAGNOSIS — Z951 Presence of aortocoronary bypass graft: Secondary | ICD-10-CM | POA: Diagnosis not present

## 2019-06-10 DIAGNOSIS — E785 Hyperlipidemia, unspecified: Secondary | ICD-10-CM | POA: Diagnosis present

## 2019-06-10 DIAGNOSIS — R55 Syncope and collapse: Secondary | ICD-10-CM | POA: Diagnosis present

## 2019-06-10 DIAGNOSIS — E7800 Pure hypercholesterolemia, unspecified: Secondary | ICD-10-CM | POA: Diagnosis present

## 2019-06-10 DIAGNOSIS — E1122 Type 2 diabetes mellitus with diabetic chronic kidney disease: Secondary | ICD-10-CM | POA: Diagnosis present

## 2019-06-10 DIAGNOSIS — Z7982 Long term (current) use of aspirin: Secondary | ICD-10-CM | POA: Diagnosis not present

## 2019-06-10 DIAGNOSIS — Z79890 Hormone replacement therapy (postmenopausal): Secondary | ICD-10-CM | POA: Diagnosis not present

## 2019-06-10 DIAGNOSIS — I2510 Atherosclerotic heart disease of native coronary artery without angina pectoris: Secondary | ICD-10-CM | POA: Diagnosis present

## 2019-06-10 DIAGNOSIS — Z794 Long term (current) use of insulin: Secondary | ICD-10-CM | POA: Diagnosis not present

## 2019-06-10 LAB — CBC
HCT: 31.7 % — ABNORMAL LOW (ref 36.0–46.0)
Hemoglobin: 10 g/dL — ABNORMAL LOW (ref 12.0–15.0)
MCH: 30.4 pg (ref 26.0–34.0)
MCHC: 31.5 g/dL (ref 30.0–36.0)
MCV: 96.4 fL (ref 80.0–100.0)
Platelets: 212 10*3/uL (ref 150–400)
RBC: 3.29 MIL/uL — ABNORMAL LOW (ref 3.87–5.11)
RDW: 14.4 % (ref 11.5–15.5)
WBC: 8 10*3/uL (ref 4.0–10.5)
nRBC: 0 % (ref 0.0–0.2)

## 2019-06-10 LAB — GLUCOSE, CAPILLARY
Glucose-Capillary: 148 mg/dL — ABNORMAL HIGH (ref 70–99)
Glucose-Capillary: 141 mg/dL — ABNORMAL HIGH (ref 70–99)
Glucose-Capillary: 145 mg/dL — ABNORMAL HIGH (ref 70–99)
Glucose-Capillary: 148 mg/dL — ABNORMAL HIGH (ref 70–99)
Glucose-Capillary: 120 mg/dL — ABNORMAL HIGH (ref 70–99)

## 2019-06-10 LAB — BASIC METABOLIC PANEL
Anion gap: 8 (ref 5–15)
BUN: 36 mg/dL — ABNORMAL HIGH (ref 8–23)
CO2: 27 mmol/L (ref 22–32)
Calcium: 8.2 mg/dL — ABNORMAL LOW (ref 8.9–10.3)
Chloride: 103 mmol/L (ref 98–111)
Creatinine, Ser: 2.04 mg/dL — ABNORMAL HIGH (ref 0.44–1.00)
GFR calc Af Amer: 28 mL/min — ABNORMAL LOW (ref >60)
GFR calc non Af Amer: 24 mL/min — ABNORMAL LOW (ref >60)
Glucose, Bld: 144 mg/dL — ABNORMAL HIGH (ref 70–99)
Potassium: 4.8 mmol/L (ref 3.5–5.1)
Sodium: 138 mmol/L (ref 135–145)

## 2019-06-10 LAB — MAGNESIUM: Magnesium: 1.6 mg/dL — ABNORMAL LOW (ref 1.7–2.4)

## 2019-06-10 MED ORDER — ADULT MULTIVITAMIN W/MINERALS CH
1.0000 | ORAL_TABLET | Freq: Every day | ORAL | Status: DC
  Administered 2019-06-10 – 2019-06-11 (×2): 1 via ORAL
  Filled 2019-06-10 (×2): qty 1

## 2019-06-10 NOTE — Progress Notes (Addendum)
Progress Note    06/10/2019 8:22 AM 1 Day Post-Op  Subjective:  Says her feet feel better.  Has been up to [facility] but not walked in hallways.    Tm 100 now 99.4 HR 80's-90's NSR 485'I-627'O systolic 35% RA  Vitals:   06/10/19 0408 06/10/19 0500  BP: (!) 119/38 (!) 111/51  Pulse: 85 88  Resp: 19 18  Temp: 99.4 F (37.4 C)   SpO2: 93%     Physical Exam: Cardiac:  regular Lungs:  Non labored Incisions:  Right groin soft without hematoma; left groin soft without hematoma.  No ecchymosis Extremities:  Brisk DP doppler signals bilaterally > bilateral PT and peroneal   CBC    Component Value Date/Time   WBC 8.0 06/10/2019 0302   RBC 3.29 (L) 06/10/2019 0302   HGB 10.0 (L) 06/10/2019 0302   HCT 31.7 (L) 06/10/2019 0302   PLT 212 06/10/2019 0302   MCV 96.4 06/10/2019 0302   MCH 30.4 06/10/2019 0302   MCHC 31.5 06/10/2019 0302   RDW 14.4 06/10/2019 0302    BMET    Component Value Date/Time   NA 138 06/10/2019 0302   K 4.8 06/10/2019 0302   CL 103 06/10/2019 0302   CO2 27 06/10/2019 0302   GLUCOSE 144 (H) 06/10/2019 0302   BUN 36 (H) 06/10/2019 0302   CREATININE 2.04 (H) 06/10/2019 0302   CALCIUM 8.2 (L) 06/10/2019 0302   GFRNONAA 24 (L) 06/10/2019 0302   GFRAA 28 (L) 06/10/2019 0302    INR No results found for: INR  No intake or output data in the 24 hours ending 06/10/19 0093   Assessment:  69 y.o. female is s/p:  Procedure Performed:             1.  Ultrasound-guided access, right femoral artery             2.  Ultrasound-guided access, left femoral artery             3.  Abdominal aortogram             4.  Bilateral lower extremity runoff             5.  Stent, right common iliac artery             6.  Stent, left common iliac artery             7.  Conscious sedation, 73 minutes             8.  Bilateral closure device, Mynx  1 Day Post-Op   Plan: -pt doing well this morning with brisk doppler signals bilateral DP -right groin is soft without  hematoma as well as left groin.  No ecchymosis present. -renal insufficiency-creatinine is 2.04 this am, which is up from 1.8.  K+ is 4.8 today-she is on Veltassa for hyperkalemia.   36cc of contrast used during procedure.  No I&O's documented.  -acute blood loss anemia with slight decrease in hgb this am to 10 from 11 yesterday late morning.  She is tolerating.   -she has ambulated to bed side commode but not in hallways.  -will keep pt another night, continue IVF at 75cc/hr and check labs in the morning.  -ambulate and OOB to chair tid at meal times.   Leontine Locket, PA-C [facility] [phone] 06/10/2019 8:22 AM    I agree with the above.  I have seen and evaluated the patient.  She is postop  day #1 status post lower extremity angiography with iliac stenting.  She had a episode of bleeding from her right groin which kept her overnight.  Her hemoglobin is relatively stable having dropped one-point from yesterday.  She is hemodynamically stable.  She did have a increase in her creatinine from 1.8-2.0.  Therefore we will keep her another day for IV hydration.  Annamarie Major

## 2019-06-11 ENCOUNTER — Encounter (HOSPITAL_COMMUNITY): Payer: Self-pay | Admitting: Surgery

## 2019-06-11 LAB — BASIC METABOLIC PANEL
Anion gap: 10 (ref 5–15)
BUN: 32 mg/dL — ABNORMAL HIGH (ref 8–23)
CO2: 24 mmol/L (ref 22–32)
Calcium: 8.2 mg/dL — ABNORMAL LOW (ref 8.9–10.3)
Chloride: 106 mmol/L (ref 98–111)
Creatinine, Ser: 1.79 mg/dL — ABNORMAL HIGH (ref 0.44–1.00)
GFR calc Af Amer: 33 mL/min — ABNORMAL LOW (ref >60)
GFR calc non Af Amer: 29 mL/min — ABNORMAL LOW (ref >60)
Glucose, Bld: 85 mg/dL (ref 70–99)
Potassium: 4.2 mmol/L (ref 3.5–5.1)
Sodium: 140 mmol/L (ref 135–145)

## 2019-06-11 LAB — GLUCOSE, CAPILLARY
Glucose-Capillary: 74 mg/dL (ref 70–99)
Glucose-Capillary: 148 mg/dL — ABNORMAL HIGH (ref 70–99)

## 2019-06-11 LAB — CBC
HCT: 29 % — ABNORMAL LOW (ref 36.0–46.0)
Hemoglobin: 9.3 g/dL — ABNORMAL LOW (ref 12.0–15.0)
MCH: 30.7 pg (ref 26.0–34.0)
MCHC: 32.1 g/dL (ref 30.0–36.0)
MCV: 95.7 fL (ref 80.0–100.0)
Platelets: 188 10*3/uL (ref 150–400)
RBC: 3.03 MIL/uL — ABNORMAL LOW (ref 3.87–5.11)
RDW: 14.1 % (ref 11.5–15.5)
WBC: 8.1 10*3/uL (ref 4.0–10.5)
nRBC: 0 % (ref 0.0–0.2)

## 2019-06-11 NOTE — Discharge Summary (Signed)
Discharge Summary    Kendra Walker 16-May-1950 69 y.o. female  [id number]  Admission Date: 06/09/2019  Discharge Date: 06/11/2019  Physician: Serafina Mitchell, MD  Admission Diagnosis: Groin hematoma [S30.1XXA] PAD (peripheral artery disease) [facility]) [I73.9]   HPI:   This is a 69 y.o. female who I met in 2017 when she moved to [facility] and wanted to reestablish vascular care+ year.  She has a history of a right femoral to above-knee popliteal bypass graft with vein in 2008 in [place] which was performed for a nonhealing wound.  The patient has history of coronary artery disease.  She is status post CABG in 2004 she takes a statin for hypercholesterolemia.  She has stage III renal insufficiency.  She is a diabetic on insulin pump.  She is a non-smoker.  She is medically managed for hypertension.  She reports that over the past year she has been having progressive symptoms in her left leg.  She has trouble getting up a ramp which measures approximately 50 feet in length.  She gets cramping and pain which is alleviated with rest.  She does not have rest pain.  She does not have open wounds.  Hospital Course:  The patient was admitted to the hospital and taken to the operating room on 06/09/2019 and underwent: Procedure Performed:             1.  Ultrasound-guided access, right femoral artery             2.  Ultrasound-guided access, left femoral artery             3.  Abdominal aortogram             4.  Bilateral lower extremity runoff             5.  Stent, right common iliac artery             6.  Stent, left common iliac artery             7.  Conscious sedation, 73 minutes             8.  Bilateral closure device, Mynx    Findings:              Aortogram: No evidence of renal artery stenosis.  Infrarenal abdominal aorta is patent without stenosis however it is small in caliber.  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  The right common iliac artery  has greater than 50% stenosis and a 40 point drop in blood pressure from the aorta to the proximal external iliac artery.             Right Lower Extremity: Right common femoral profundofemoral artery widely patent.  There is a bypass graft originating in the common femoral artery.  The proximal anastomosis is widely patent.  The distal anastomosis is widely patent.  The distal popliteal artery remains widely patent with two-vessel runoff via the anterior tibial and peroneal artery.             Left Lower Extremity: Left common femoral profundofemoral and superficial femoral artery are patent without stenosis.  The below-knee popliteal artery is patent throughout its course.  The proximal anterior tibial and peroneal artery are widely patent.  Distal images were not obtained  The pt tolerated the procedure well and was transported to the PACU in good condition.   During recovery, pt developed a syncopal episode and also developed a  right groin hematoma.  Manual pressure was held on the groin, and it is now soft.  She remains tender in the right groin.  Hb dropped 1 point to 11.  She will need to be admitted for post procedure bleed.  On POD 1, pt doing well and right groin is soft without hematoma as is the left groin.  No ecchymosis present. Pt does have hx of renal insufficiency and creatinine was elevated to 2.04, which was up from 1.8 on admit.  Her K+ was 4.8 and was scheduled to receive her Veltassa.  She did have acute blood loss anemia but she was tolerating.  She was kept a 2nd night to continue IV hydration and monitor blood loss anemia.   POD 2, pt doing well with improvement in her renal function after IV hydration.  Her hgb down slightly at 9.3 from 10 yesterday, but most likely dilutional at this point.  She is discharged home with follow up in 4-6 weeks.  Continue plavix/asa/statin.  The remainder of the hospital course consisted of increasing mobilization and increasing intake of solids  without difficulty.  CBC    Component Value Date/Time   WBC 8.1 06/11/2019 0304   RBC 3.03 (L) 06/11/2019 0304   HGB 9.3 (L) 06/11/2019 0304   HCT 29.0 (L) 06/11/2019 0304   PLT 188 06/11/2019 0304   MCV 95.7 06/11/2019 0304   MCH 30.7 06/11/2019 0304   MCHC 32.1 06/11/2019 0304   RDW 14.1 06/11/2019 0304    BMET    Component Value Date/Time   NA 140 06/11/2019 0304   K 4.2 06/11/2019 0304   CL 106 06/11/2019 0304   CO2 24 06/11/2019 0304   GLUCOSE 85 06/11/2019 0304   BUN 32 (H) 06/11/2019 0304   CREATININE 1.79 (H) 06/11/2019 0304   CALCIUM 8.2 (L) 06/11/2019 0304   GFRNONAA 29 (L) 06/11/2019 0304   GFRAA 33 (L) 06/11/2019 0304      Discharge Instructions    Discharge patient   Complete by: As directed    Discharge disposition: 01-Home or Self Care   Discharge patient date: 06/11/2019      Discharge Diagnosis:  Groin hematoma [S30.1XXA] PAD (peripheral artery disease) ([place]) [I73.9]  Secondary Diagnosis: Patient Active Problem List   Diagnosis Date Noted  . Groin hematoma 06/09/2019  . CAD (coronary artery disease) 11/11/2013  . HTN (hypertension) 11/11/2013  . PAD (peripheral artery disease) ([place]) 11/11/2013  . Hyperlipidemia 11/11/2013   Past Medical History:  Diagnosis Date  . Cancer (HCC)    Squamous Cell  . Diabetes mellitus without complication ([place])   . Hyperlipemia   . Hypertension   . Hypothyroidism      Allergies as of 06/11/2019      Reactions   Bactrim [sulfamethoxazole-trimethoprim] Other (See Comments)   Elevates potassium.    Tape Other (See Comments)   not allergic but causes sensitivity.   Fluorescein Rash   Procrit [epoetin (alfa)] Rash      Medication List    TAKE these medications   acetaminophen 500 MG tablet Commonly known as: TYLENOL Take 500 mg by mouth every 6 (six) hours as needed for mild pain or headache.   aspirin EC 81 MG tablet Take 81 mg by mouth daily.   atorvastatin 80 MG tablet Commonly known as:  LIPITOR Take 1 tablet (80 mg total) by mouth daily. What changed: when to take this   CALCIUM 600 PO Take 600 mg by mouth in the morning and  at bedtime.   clopidogrel 75 MG tablet Commonly known as: Plavix Take 1 tablet (75 mg total) by mouth daily.   Ferrex 150 150 MG capsule Generic drug: iron polysaccharides Take 150 mg by mouth at bedtime.   Fish Oil 1200 MG Caps Take 1,200 mg by mouth daily.   fludrocortisone 0.1 MG tablet Commonly known as: FLORINEF Take 0.05 mg by mouth daily.   HumaLOG 100 UNIT/ML injection Generic drug: insulin lispro Inject into the skin See admin instructions. VIA INSULIN PUMP   insulin pump Soln Inject into the skin. HUMALOG 100 UNIT/ML   levothyroxine 100 MCG tablet Commonly known as: SYNTHROID Take 100 mcg by mouth daily.   magnesium chloride 64 MG Tbec SR tablet Commonly known as: SLOW-MAG Take 1-2 tablets by mouth See admin instructions. TAKE 1 TABLET IN THE MORNING & TAKE 2 TABLETS AT NIGHT.   multivitamin with minerals Tabs tablet Take 1 tablet by mouth daily.   predniSONE 5 MG tablet Commonly known as: DELTASONE Take 5 mg by mouth daily with breakfast.   PRESERVISION AREDS 2 PO Take 1 tablet by mouth daily.   sodium bicarbonate 650 MG tablet Take 650 mg by mouth in the morning and at bedtime.   Veltassa 8.4 g packet Generic drug: patiromer Take 8.4 g by mouth every Wednesday.   vitamin C with rose hips 500 MG tablet Take 500 mg by mouth at bedtime.   Vitamin D3 50 MCG (2000 UT) Tabs Take 2,000 Units by mouth daily.         Instructions:  [facility]  Discharge Instructions  Lower Extremity Angiogram; Angioplasty/Stenting  Please refer to the following instructions for your post-procedure care. Your surgeon or physician assistant will discuss any changes with you.  Activity  Avoid lifting more than 8 pounds (1 gallons of milk) for 72 hours (3 days) after your procedure. You  may walk as much as you can tolerate. It's OK to drive after 72 hours.  Bathing/Showering  You may shower the day after your procedure. If you have a bandage, you may remove it at 24- 48 hours. Clean your incision site with mild soap and water. Pat the area dry with a clean towel.  Diet  Resume your pre-procedure diet. There are no special food restrictions following this procedure. All patients with peripheral vascular disease should follow a low fat/low cholesterol diet. In order to heal from your surgery, it is CRITICAL to get adequate nutrition. Your body requires vitamins, minerals, and protein. Vegetables are the best source of vitamins and minerals. Vegetables also provide the perfect balance of protein. Processed food has little nutritional value, so try to avoid this.  Medications  Resume taking all of your medications unless your doctor tells you not to. If your incision is causing pain, you may take over-the-counter pain relievers such as acetaminophen (Tylenol)  Follow Up  Follow up will be arranged at the time of your procedure. You may have an office visit scheduled or may be scheduled for surgery. Ask your surgeon if you have any questions.  Please call us immediately for any of the following conditions: .Severe or worsening pain your legs or feet at rest or with walking. .Increased pain, redness, drainage at your groin puncture site. .Fever of 101 degrees or higher. .If you have any mild or slow bleeding from your puncture site: lie down, apply firm constant pressure over the area with a piece of gauze or a clean wash cloth for  30 minutes- no peeking!, call 911 right away if you are still bleeding after 30 minutes, or if the bleeding is heavy and unmanageable.  Reduce your risk factors of vascular disease:  . Stop smoking. If you would like help call [facility] at 1-800-QUIT-NOW 775-087-9028) or [place] at 562-080-8867. . Manage your cholesterol . Maintain a desired  weight . Control your diabetes . Keep your blood pressure down .  If you have any questions, please call the office at [phone]  Prescriptions given:  none given  Disposition: home  Patient's condition: is Good  Follow up: 1. Dr. Dr. Pecola Leisure in 4-6 weeks with ABI and duplex   Leontine Locket, PA-C [facility] [phone] 06/11/2019  7:36 AM

## 2019-06-11 NOTE — Progress Notes (Signed)
    Subjective  - POD #2  Feels better this am   Physical Exam:  Groins tender but soft abd soft Extremities warm       Assessment/Plan:  POD #2  Acute on chronic renal failure:  Creatinine back to baseline Acute blood loss anemia:  Nb stable D/c home today  [place] Zylen Wenig 06/11/2019 8:08 AM --  Vitals:   06/11/19 0300 06/11/19 0747  BP: (!) 124/41 (!) 105/39  Pulse:  77  Resp: 13 19  Temp: 98.6 F (37 C) 99 F (37.2 C)  SpO2: 94% 95%    Intake/Output Summary (Last 24 hours) at 06/11/2019 0808 Last data filed at 06/10/2019 1846 Gross per 24 hour  Intake 240 ml  Output --  Net 240 ml     Laboratory CBC    Component Value Date/Time   WBC 8.1 06/11/2019 0304   HGB 9.3 (L) 06/11/2019 0304   HCT 29.0 (L) 06/11/2019 0304   PLT 188 06/11/2019 0304    BMET    Component Value Date/Time   NA 140 06/11/2019 0304   K 4.2 06/11/2019 0304   CL 106 06/11/2019 0304   CO2 24 06/11/2019 0304   GLUCOSE 85 06/11/2019 0304   BUN 32 (H) 06/11/2019 0304   CREATININE 1.79 (H) 06/11/2019 0304   CALCIUM 8.2 (L) 06/11/2019 0304   GFRNONAA 29 (L) 06/11/2019 0304   GFRAA 33 (L) 06/11/2019 0304    COAG No results found for: INR, PROTIME No results found for: PTT  Antibiotics Anti-infectives (From admission, onward)   None       V. Leia Alf, M.D., [facility] [place] Office: [phone] Pager:  [phone]

## 2019-06-11 NOTE — Plan of Care (Signed)
  Problem: Education: Goal: Knowledge of General Education information will improve Description: Including pain rating scale, medication(s)/side effects and non-pharmacologic comfort measures Outcome: Adequate for Discharge   

## 2019-06-11 NOTE — Progress Notes (Signed)
Discharge: Discharge summary reviewed with patient and spouse.  Medications changes and side effects reviewed.  Weight restrictions, bathing and driving instructions reviewed.   Vital signs taken, CCMD notified & IV access discontinued prior to discharge. Staff assisted patient to private vehicle.

## 2019-06-23 DIAGNOSIS — S82201A Unspecified fracture of shaft of right tibia, initial encounter for closed fracture: Secondary | ICD-10-CM | POA: Diagnosis not present

## 2019-06-23 DIAGNOSIS — M25569 Pain in unspecified knee: Secondary | ICD-10-CM | POA: Diagnosis not present

## 2019-07-02 DIAGNOSIS — I10 Essential (primary) hypertension: Secondary | ICD-10-CM | POA: Diagnosis not present

## 2019-07-02 DIAGNOSIS — E039 Hypothyroidism, unspecified: Secondary | ICD-10-CM | POA: Diagnosis not present

## 2019-07-02 DIAGNOSIS — Z79899 Other long term (current) drug therapy: Secondary | ICD-10-CM | POA: Diagnosis not present

## 2019-07-02 DIAGNOSIS — E271 Primary adrenocortical insufficiency: Secondary | ICD-10-CM | POA: Diagnosis not present

## 2019-07-02 DIAGNOSIS — E10649 Type 1 diabetes mellitus with hypoglycemia without coma: Secondary | ICD-10-CM | POA: Diagnosis not present

## 2019-07-02 DIAGNOSIS — Z4681 Encounter for fitting and adjustment of insulin pump: Secondary | ICD-10-CM | POA: Diagnosis not present

## 2019-07-02 DIAGNOSIS — E1065 Type 1 diabetes mellitus with hyperglycemia: Secondary | ICD-10-CM | POA: Diagnosis not present

## 2019-07-02 DIAGNOSIS — Z794 Long term (current) use of insulin: Secondary | ICD-10-CM | POA: Diagnosis not present

## 2019-07-07 ENCOUNTER — Other Ambulatory Visit: Payer: Self-pay | Admitting: *Deleted

## 2019-07-07 DIAGNOSIS — Z95828 Presence of other vascular implants and grafts: Secondary | ICD-10-CM

## 2019-07-07 DIAGNOSIS — I779 Disorder of arteries and arterioles, unspecified: Secondary | ICD-10-CM

## 2019-07-13 ENCOUNTER — Ambulatory Visit (INDEPENDENT_AMBULATORY_CARE_PROVIDER_SITE_OTHER): Payer: Medicare Other | Admitting: Physician Assistant

## 2019-07-13 ENCOUNTER — Ambulatory Visit (HOSPITAL_COMMUNITY)
Admission: RE | Admit: 2019-07-13 | Discharge: 2019-07-13 | Disposition: A | Discharge status: Home / Self Care | Payer: Medicare Other | Source: Ambulatory Visit | Attending: Surgery | Admitting: Surgery

## 2019-07-13 ENCOUNTER — Other Ambulatory Visit: Payer: Self-pay

## 2019-07-13 VITALS — BP 150/52 | HR 72 | Temp 97.3°F | Resp 20 | Ht 62.0 in | Wt 125.2 lb

## 2019-07-13 DIAGNOSIS — Z95828 Presence of other vascular implants and grafts: Secondary | ICD-10-CM

## 2019-07-13 DIAGNOSIS — I779 Disorder of arteries and arterioles, unspecified: Secondary | ICD-10-CM | POA: Diagnosis present

## 2019-07-13 NOTE — Progress Notes (Signed)
Office Note     CC:  follow up Requesting Provider:  Curlene Labrum, MD  HPI: Kendra Walker is a 69 y.o. (02-Jun-1950) female who presents for follow up of peripheral vascular disease. She recently underwent Abdominal aortogram, bilateral lower extremity runoff with stenting of her right common iliac artery and stenting of her left common iliac artery on 06/09/19 by Dr. Trula Slade.  This was via bilateral femoral artery approach for disabling claudication. Her right lower extremity bypass graft was found to be patent with two vessel AT and peroneal runoff. Her left lower extremity aside from the diseased common iliac artery was patent throughout with two vessel AT and peroneal runoff. Following her procedure she had a syncopal episode and was found to have a right groin hematoma. She did well otherwise and her hematoma remained soft and stable. She was kept overnight for IV hydration and observation. She was discharge home POD#2 on her Plavix, Aspirin and Statin  She denies any lower extremity pain. States that her claudication symptoms are now resolved. She has a little tenderness in left groin but denies any pain in right groin. She has not had any swelling in her groins or bruising. She denies any pain at rest and no non healing wounds  She has history of right femoral to above knee popliteal bypass with vein in 2008 in [place]  The pt is on a statin for cholesterol management.  The pt is on a daily aspirin.   Other AC:  Plavix The pt not on medication for hypertension.   The pt is diabetic.  On insulin pump Tobacco hx: Non smoker  Past Medical History:  Diagnosis Date  . Cancer (HCC)    Squamous Cell  . Diabetes mellitus without complication ([place])   . Hyperlipemia   . Hypertension   . Hypothyroidism   . PAD (peripheral artery disease) ([place])     Past Surgical History:  Procedure Laterality Date  . ABDOMINAL AORTOGRAM W/LOWER EXTREMITY N/A 06/09/2019   Procedure: ABDOMINAL  AORTOGRAM W/LOWER EXTREMITY;  Surgeon: Serafina Mitchell, MD;  Location: [facility];  Service: Cardiovascular;  Laterality: N/A;  . APPENDECTOMY    . CATARACT EXTRACTION Bilateral   . CORONARY ARTERY BYPASS GRAFT    . PERIPHERAL VASCULAR INTERVENTION Bilateral 06/09/2019   Procedure: PERIPHERAL VASCULAR INTERVENTION;  Surgeon: Serafina Mitchell, MD;  Location: [facility];  Service: Cardiovascular;  Laterality: Bilateral;  . TONSILLECTOMY    . YAG LASER APPLICATION Right 0/81/4481   Procedure: YAG LASER APPLICATION;  Surgeon: Williams Che, MD;  Location: [facility];  Service: Ophthalmology;  Laterality: Right;    Social History   Socioeconomic History  . Marital status: Married    Spouse name: Not on file  . Number of children: Not on file  . Years of education: Not on file  . Highest education level: Not on file  Occupational History  . Not on file  Tobacco Use  . Smoking status: Never Smoker  . Smokeless tobacco: Never Used  Vaping Use  . Vaping Use: Never used  Substance and Sexual Activity  . Alcohol use: No    Alcohol/week: 0.0 standard drinks  . Drug use: No  . Sexual activity: Not on file  Other Topics Concern  . Not on file  Social History Narrative  . Not on file   Social Determinants of Health   Financial Resource Strain:   . Difficulty of Paying Living Expenses:   Food Insecurity:   .  Worried About Charity fundraiser in the Last Year:   . Arboriculturist in the Last Year:   Transportation Needs:   . Film/video editor (Medical):   Marland Kitchen Lack of Transportation (Non-Medical):   Physical Activity:   . Days of Exercise per Week:   . Minutes of Exercise per Session:   Stress:   . Feeling of Stress :   Social Connections:   . Frequency of Communication with Friends and Family:   . Frequency of Social Gatherings with Friends and Family:   . Attends Religious Services:   . Active Member of Clubs or Organizations:   . Attends Archivist  Meetings:   Marland Kitchen Marital Status:   Intimate Partner Violence:   . Fear of Current or Ex-Partner:   . Emotionally Abused:   Marland Kitchen Physically Abused:   . Sexually Abused:     Family History  Problem Relation Age of Onset  . Heart disease Mother   . Hypertension Mother   . Hypertension Father     Current Outpatient Medications  Medication Sig Dispense Refill  . acetaminophen (TYLENOL) 500 MG tablet Take 500 mg by mouth every 6 (six) hours as needed for mild pain or headache.     . Ascorbic Acid (VITAMIN C WITH ROSE HIPS) 500 MG tablet Take 500 mg by mouth at bedtime.    Marland Kitchen aspirin EC 81 MG tablet Take 81 mg by mouth daily.    Marland Kitchen atorvastatin (LIPITOR) 80 MG tablet Take 1 tablet (80 mg total) by mouth daily. (Patient taking differently: Take 80 mg by mouth every evening. ) 30 tablet 6  . Calcium Carbonate (CALCIUM 600 PO) Take 600 mg by mouth in the morning and at bedtime.    . Cholecalciferol (VITAMIN D3) 50 MCG (2000 UT) TABS Take 2,000 Units by mouth daily.    . clopidogrel (PLAVIX) 75 MG tablet Take 1 tablet (75 mg total) by mouth daily. 30 tablet 11  . fludrocortisone (FLORINEF) 0.1 MG tablet Take 0.05 mg by mouth daily.     Marland Kitchen HUMALOG 100 UNIT/ML injection Inject into the skin See admin instructions. VIA INSULIN PUMP    . Insulin Human (INSULIN PUMP) SOLN Inject into the skin. HUMALOG 100 UNIT/ML    . iron polysaccharides (FERREX 150) 150 MG capsule Take 150 mg by mouth at bedtime.     Marland Kitchen levothyroxine (SYNTHROID, LEVOTHROID) 100 MCG tablet Take 100 mcg by mouth daily.     . magnesium chloride (SLOW-MAG) 64 MG TBEC SR tablet Take 1-2 tablets by mouth See admin instructions. TAKE 1 TABLET IN THE MORNING & TAKE 2 TABLETS AT NIGHT.    . Multiple Vitamin (MULTIVITAMIN WITH MINERALS) TABS tablet Take 1 tablet by mouth daily.    . Multiple Vitamins-Minerals (PRESERVISION AREDS 2 PO) Take 1 tablet by mouth daily.     . Omega-3 Fatty Acids (FISH OIL) 1200 MG CAPS Take 1,200 mg by mouth daily.    .  predniSONE (DELTASONE) 5 MG tablet Take 5 mg by mouth daily with breakfast.    . sodium bicarbonate 650 MG tablet Take 650 mg by mouth in the morning and at bedtime.    . VELTASSA 8.4 g packet Take 8.4 g by mouth every Wednesday.   5   No current facility-administered medications for this visit.    Allergies  Allergen Reactions  . Bactrim [Sulfamethoxazole-Trimethoprim] Other (See Comments)    Elevates potassium.   . Tape Other (See Comments)  not allergic but causes sensitivity.  . Fluorescein Rash  . Procrit [Epoetin (Alfa)] Rash     REVIEW OF SYSTEMS:  Review of Systems  Constitutional: Negative for chills, fever and malaise/fatigue.  Eyes: Negative for double vision.  Respiratory: Negative for cough and shortness of breath.   Cardiovascular: Negative for chest pain, palpitations and leg swelling.  Gastrointestinal: Negative for abdominal pain, blood in stool, constipation, diarrhea, nausea and vomiting.  Genitourinary: Negative for dysuria.  Musculoskeletal: Negative for myalgias.  Neurological: Negative for dizziness, focal weakness, weakness and headaches.  Endo/Heme/Allergies: Bruises/bleeds easily (since starting Plavix).    PHYSICAL EXAMINATION:  Vitals:   07/13/19 1129  BP: (!) 150/52  Pulse: 72  Resp: 20  Temp: (!) 97.3 F (36.3 C)  TempSrc: Temporal  SpO2: 100%  Weight: 125 lb 3.2 oz (56.8 kg)  Height: 5\' 2"  (1.575 m)    General:  WDWN in NAD; vital signs documented above Gait: ambulates with cane HENT: WNL, normocephalic Pulmonary: normal non-labored breathing , without Rales, rhonchi,  wheezing Cardiac: regular HR, without  Murmurs without carotid bruit Abdomen: soft, NT, no masses, audible abdominal bruit Vascular Exam/Pulses:  Right Left  Radial 2+ (normal) 2+ (normal)  Femoral 2+ (normal) 2+ (normal)  Popliteal 2+ (normal) 2+ (normal)  DP 2+ (normal) 2+ (normal)  PT Not palpable Not palpable   Extremities: without ischemic changes,  without Gangrene , without cellulitis; without open wounds;  Musculoskeletal: no muscle wasting or atrophy  Neurologic: A&O X 3;  No focal weakness or paresthesias are detected Psychiatric:  The pt has Normal affect.   Non-Invasive Vascular Imaging:   07/13/19 +-------+-----------+-----------+------------+------------+  ABI/TBIToday's ABIToday's TBIPrevious ABIPrevious TBI  +-------+-----------+-----------+------------+------------+  Right [place]     1.31    [place]     0.73      +-------+-----------+-----------+------------+------------+  Left  [place]     1.09    [place]     0.28      +-------+-----------+-----------+------------+------------+    ASSESSMENT/PLAN:: 69 y.o. female here for follow up for peripheral vascular disease. She recently underwent Abdominal aortogram, bilateral lower extremity runoff with stenting of her right common iliac artery and stenting of her left common iliac artery on 06/09/19 by Dr. Trula Slade.  This was via bilateral femoral artery approach for disabling claudication. Her non invasive studies today are greatly improved post intervention bilaterally. Her symptoms are now resolved. - She will continue her Aspirin, Statin and Plavix - She will remain on her Plavix for 3 months - I have advised her to follow up sooner if she develops rest pain, claudication or non healing wounds - She will follow up at 6 months with ABI's and Aortoiliac duplex to evaluate stents   Karoline Caldwell, PA-C [facility] [phone]

## 2019-07-15 ENCOUNTER — Other Ambulatory Visit: Payer: Self-pay | Admitting: *Deleted

## 2019-07-15 DIAGNOSIS — Z95828 Presence of other vascular implants and grafts: Secondary | ICD-10-CM

## 2019-07-15 DIAGNOSIS — I779 Disorder of arteries and arterioles, unspecified: Secondary | ICD-10-CM

## 2019-07-15 DIAGNOSIS — I70213 Atherosclerosis of native arteries of extremities with intermittent claudication, bilateral legs: Secondary | ICD-10-CM

## 2019-08-11 DIAGNOSIS — E1342 Other specified diabetes mellitus with diabetic polyneuropathy: Secondary | ICD-10-CM | POA: Diagnosis not present

## 2019-08-11 DIAGNOSIS — L851 Acquired keratosis [keratoderma] palmaris et plantaris: Secondary | ICD-10-CM | POA: Diagnosis not present

## 2019-08-11 DIAGNOSIS — B351 Tinea unguium: Secondary | ICD-10-CM | POA: Diagnosis not present

## 2019-08-18 DIAGNOSIS — E103593 Type 1 diabetes mellitus with proliferative diabetic retinopathy without macular edema, bilateral: Secondary | ICD-10-CM | POA: Diagnosis not present

## 2019-08-18 DIAGNOSIS — H0102B Squamous blepharitis left eye, upper and lower eyelids: Secondary | ICD-10-CM | POA: Diagnosis not present

## 2019-08-18 DIAGNOSIS — H04123 Dry eye syndrome of bilateral lacrimal glands: Secondary | ICD-10-CM | POA: Diagnosis not present

## 2019-08-18 DIAGNOSIS — H0102A Squamous blepharitis right eye, upper and lower eyelids: Secondary | ICD-10-CM | POA: Diagnosis not present

## 2019-08-18 DIAGNOSIS — Z961 Presence of intraocular lens: Secondary | ICD-10-CM | POA: Diagnosis not present

## 2019-08-18 DIAGNOSIS — H353132 Nonexudative age-related macular degeneration, bilateral, intermediate dry stage: Secondary | ICD-10-CM | POA: Diagnosis not present

## 2019-08-21 DIAGNOSIS — I130 Hypertensive heart and chronic kidney disease with heart failure and stage 1 through stage 4 chronic kidney disease, or unspecified chronic kidney disease: Secondary | ICD-10-CM | POA: Diagnosis not present

## 2019-08-21 DIAGNOSIS — E1122 Type 2 diabetes mellitus with diabetic chronic kidney disease: Secondary | ICD-10-CM | POA: Diagnosis not present

## 2019-08-21 DIAGNOSIS — I5031 Acute diastolic (congestive) heart failure: Secondary | ICD-10-CM | POA: Diagnosis not present

## 2019-08-21 DIAGNOSIS — N184 Chronic kidney disease, stage 4 (severe): Secondary | ICD-10-CM | POA: Diagnosis not present

## 2019-08-31 DIAGNOSIS — J069 Acute upper respiratory infection, unspecified: Secondary | ICD-10-CM | POA: Diagnosis not present

## 2019-08-31 DIAGNOSIS — Z20828 Contact with and (suspected) exposure to other viral communicable diseases: Secondary | ICD-10-CM | POA: Diagnosis not present

## 2019-09-02 ENCOUNTER — Encounter (INDEPENDENT_AMBULATORY_CARE_PROVIDER_SITE_OTHER): Payer: Medicare Other | Admitting: Ophthalmology

## 2019-09-09 ENCOUNTER — Encounter (INDEPENDENT_AMBULATORY_CARE_PROVIDER_SITE_OTHER): Payer: Medicare Other | Admitting: Ophthalmology

## 2019-09-14 DIAGNOSIS — S8001XA Contusion of right knee, initial encounter: Secondary | ICD-10-CM | POA: Diagnosis not present

## 2019-09-14 DIAGNOSIS — I739 Peripheral vascular disease, unspecified: Secondary | ICD-10-CM | POA: Diagnosis not present

## 2019-09-14 DIAGNOSIS — Z6823 Body mass index (BMI) 23.0-23.9, adult: Secondary | ICD-10-CM | POA: Diagnosis not present

## 2019-09-22 DIAGNOSIS — I5031 Acute diastolic (congestive) heart failure: Secondary | ICD-10-CM | POA: Diagnosis not present

## 2019-09-22 DIAGNOSIS — N184 Chronic kidney disease, stage 4 (severe): Secondary | ICD-10-CM | POA: Diagnosis not present

## 2019-09-22 DIAGNOSIS — E1122 Type 2 diabetes mellitus with diabetic chronic kidney disease: Secondary | ICD-10-CM | POA: Diagnosis not present

## 2019-09-22 DIAGNOSIS — I130 Hypertensive heart and chronic kidney disease with heart failure and stage 1 through stage 4 chronic kidney disease, or unspecified chronic kidney disease: Secondary | ICD-10-CM | POA: Diagnosis not present

## 2019-10-13 ENCOUNTER — Ambulatory Visit (INDEPENDENT_AMBULATORY_CARE_PROVIDER_SITE_OTHER): Payer: Medicare Other | Admitting: Ophthalmology

## 2019-10-13 ENCOUNTER — Other Ambulatory Visit: Payer: Self-pay

## 2019-10-13 ENCOUNTER — Encounter (INDEPENDENT_AMBULATORY_CARE_PROVIDER_SITE_OTHER): Payer: Self-pay | Admitting: Ophthalmology

## 2019-10-13 DIAGNOSIS — H35373 Puckering of macula, bilateral: Secondary | ICD-10-CM | POA: Diagnosis not present

## 2019-10-13 DIAGNOSIS — Z794 Long term (current) use of insulin: Secondary | ICD-10-CM

## 2019-10-13 DIAGNOSIS — E103553 Type 1 diabetes mellitus with stable proliferative diabetic retinopathy, bilateral: Secondary | ICD-10-CM | POA: Insufficient documentation

## 2019-10-13 DIAGNOSIS — H353133 Nonexudative age-related macular degeneration, bilateral, advanced atrophic without subfoveal involvement: Secondary | ICD-10-CM | POA: Diagnosis not present

## 2019-10-13 DIAGNOSIS — E113553 Type 2 diabetes mellitus with stable proliferative diabetic retinopathy, bilateral: Secondary | ICD-10-CM

## 2019-10-13 DIAGNOSIS — H353212 Exudative age-related macular degeneration, right eye, with inactive choroidal neovascularization: Secondary | ICD-10-CM

## 2019-10-13 NOTE — Assessment & Plan Note (Signed)
OS remained stable, OD does have an old subfoveal scar which is stable

## 2019-10-13 NOTE — Patient Instructions (Signed)
To report promptly if new onset visual acuity distortions or declines in either eye

## 2019-10-13 NOTE — Assessment & Plan Note (Signed)

## 2019-10-13 NOTE — Progress Notes (Signed)
10/13/2019     CHIEF COMPLAINT Patient presents for Retina Follow Up   HISTORY OF PRESENT ILLNESS: Kendra Walker is a 69 y.o. female who presents to the clinic today for:   HPI    Retina Follow Up    Patient presents with  Diabetic Retinopathy.  In both eyes.  This started 1 year ago.  Severity is mild.  Duration of 1 year.  Since onset it is stable.          Comments    1 Year Diabetic F/U OU  Pt denies noticeable changes to [place] OU since last visit. Pt denies ocular pain, flashes of light, or floaters OU.  A1c: 7.5, 06/2019 LBS: 202 in office today       Last edited by Rockie Neighbours, [place] on 10/13/2019  2:18 PM. (History)      Referring physician: Curlene Labrum, MD [place],  [place] [address]  HISTORICAL INFORMATION:   Selected notes from the [facility]: No current outpatient medications on file. (Ophthalmic Drugs)   No current facility-administered medications for this visit. (Ophthalmic Drugs)   Current Outpatient Medications (Other)  Medication Sig  . acetaminophen (TYLENOL) 500 MG tablet Take 500 mg by mouth every 6 (six) hours as needed for mild pain or headache.   . Ascorbic Acid (VITAMIN C WITH ROSE HIPS) 500 MG tablet Take 500 mg by mouth at bedtime.  Marland Kitchen aspirin EC 81 MG tablet Take 81 mg by mouth daily.  Marland Kitchen atorvastatin (LIPITOR) 80 MG tablet Take 1 tablet (80 mg total) by mouth daily. (Patient taking differently: Take 80 mg by mouth every evening. )  . Calcium Carbonate (CALCIUM 600 PO) Take 600 mg by mouth in the morning and at bedtime.  . Cholecalciferol (VITAMIN D3) 50 MCG (2000 UT) TABS Take 2,000 Units by mouth daily.  . clopidogrel (PLAVIX) 75 MG tablet Take 1 tablet (75 mg total) by mouth daily.  . fludrocortisone (FLORINEF) 0.1 MG tablet Take 0.05 mg by mouth daily.   Marland Kitchen HUMALOG 100 UNIT/ML injection Inject into the skin See admin instructions. VIA INSULIN PUMP  . Insulin Human (INSULIN PUMP) SOLN  Inject into the skin. HUMALOG 100 UNIT/ML  . iron polysaccharides (FERREX 150) 150 MG capsule Take 150 mg by mouth at bedtime.   Marland Kitchen levothyroxine (SYNTHROID, LEVOTHROID) 100 MCG tablet Take 100 mcg by mouth daily.   . magnesium chloride (SLOW-MAG) 64 MG TBEC SR tablet Take 1-2 tablets by mouth See admin instructions. TAKE 1 TABLET IN THE MORNING & TAKE 2 TABLETS AT NIGHT.  . Multiple Vitamin (MULTIVITAMIN WITH MINERALS) TABS tablet Take 1 tablet by mouth daily.  . Multiple Vitamins-Minerals (PRESERVISION AREDS 2 PO) Take 1 tablet by mouth daily.   . Omega-3 Fatty Acids (FISH OIL) 1200 MG CAPS Take 1,200 mg by mouth daily.  . predniSONE (DELTASONE) 5 MG tablet Take 5 mg by mouth daily with breakfast.  . sodium bicarbonate 650 MG tablet Take 650 mg by mouth in the morning and at bedtime.  . VELTASSA 8.4 g packet Take 8.4 g by mouth every Wednesday.    No current facility-administered medications for this visit. (Other)      REVIEW OF SYSTEMS:    ALLERGIES Allergies  Allergen Reactions  . Bactrim [Sulfamethoxazole-Trimethoprim] Other (See Comments)    Elevates potassium.   . Tape Other (See Comments)    not allergic but causes sensitivity.  Marland Kitchen  Fluorescein Rash  . Procrit [Epoetin (Alfa)] Rash    PAST MEDICAL HISTORY Past Medical History:  Diagnosis Date  . Cancer (HCC)    Squamous Cell  . Diabetes mellitus without complication ([place])   . Hyperlipemia   . Hypertension   . Hypothyroidism   . PAD (peripheral artery disease) ([place])    Past Surgical History:  Procedure Laterality Date  . ABDOMINAL AORTOGRAM W/LOWER EXTREMITY N/A 06/09/2019   Procedure: ABDOMINAL AORTOGRAM W/LOWER EXTREMITY;  Surgeon: Serafina Mitchell, MD;  Location: [facility];  Service: Cardiovascular;  Laterality: N/A;  . APPENDECTOMY    . CATARACT EXTRACTION Bilateral   . CORONARY ARTERY BYPASS GRAFT    . PERIPHERAL VASCULAR INTERVENTION Bilateral 06/09/2019   Procedure: PERIPHERAL VASCULAR  INTERVENTION;  Surgeon: Serafina Mitchell, MD;  Location: [facility];  Service: Cardiovascular;  Laterality: Bilateral;  . TONSILLECTOMY    . YAG LASER APPLICATION Right 0/35/0093   Procedure: YAG LASER APPLICATION;  Surgeon: Williams Che, MD;  Location: [facility];  Service: Ophthalmology;  Laterality: Right;    FAMILY HISTORY Family History  Problem Relation Age of Onset  . Heart disease Mother   . Hypertension Mother   . Hypertension Father     SOCIAL HISTORY Social History   Tobacco Use  . Smoking status: Never Smoker  . Smokeless tobacco: Never Used  Vaping Use  . Vaping Use: Never used  Substance Use Topics  . Alcohol use: No    Alcohol/week: 0.0 standard drinks  . Drug use: No         OPHTHALMIC EXAM: Base Eye Exam    Visual Acuity (ETDRS)      Right Left   Dist cc 20/100 +1 20/30 +1   Dist ph cc 20/50 +1 NI   Correction: Glasses       Tonometry (Tonopen, 2:20 PM)      Right Left   Pressure 21 20       Pupils      Dark Light Shape React APD   Right 3 3 Round Minimal None   Left 4 4 Irregular Minimal None       Visual Fields (Counting fingers)      Left Right   Restrictions Total inferior temporal deficiency Total superior temporal, superior nasal deficiencies       Extraocular Movement      Right Left    Full Full       Neuro/Psych    Oriented x3: Yes   Mood/Affect: Normal       Dilation    Both eyes: 1.0% Mydriacyl, 2.5% Phenylephrine @ 2:25 PM        Slit Lamp and Fundus Exam    External Exam      Right Left   External Normal Normal       Slit Lamp Exam      Right Left   Lids/Lashes Normal Normal   Conjunctiva/Sclera White and quiet White and quiet   Cornea Clear Clear   Anterior Chamber Deep and quiet Deep and quiet   Iris Round and reactive Round and reactive   Lens Posterior chamber intraocular lens Posterior chamber intraocular lens   Anterior Vitreous Normal Normal       Fundus Exam      Right Left    Posterior Vitreous Posterior vitreous detachment Posterior vitreous detachment   Disc Normal    C/D Ratio 0.1 0.3   Macula Disciform scar Disciform scar, non foveal, not  active   Vessels PDR-quiet PDR-quiet   Periphery Good PRP,  attached 360 Good PRP,  attached [place] and Procedures for 10/13/19  OCT, Retina - OU - Both Eyes       Right Eye Quality was good. Scan locations included subfoveal. Central Foveal Thickness: 200. Findings include central retinal atrophy, outer retinal atrophy, inner retinal atrophy, abnormal foveal contour, disciform scar.   Left Eye Quality was good. Scan locations included subfoveal. Central Foveal Thickness: 301. Findings include abnormal foveal contour, disciform scar.   Notes Diffuse retinal atrophy OD, stable over time with subfoveal scarring  OS, stable with old disciform scar temporally.  No active maculopathy.                ASSESSMENT/PLAN:  Controlled type 2 diabetes mellitus with stable proliferative retinopathy of both eyes, with long-term current use of insulin (HCC) The nature of regressed proliferative diabetic retinopathy was discussed with the patient. The patient was advised to maintain good glucose, blood pressure, monitor kidney function and serum lipid control as advised by personal physician. Rare risk for reactivation of progression exist with untreated severe anemia, untreated renal failure, untreated heart failure, and smoking. Complete avoidance of smoking was recommended. The chance of recurrent proliferative diabetic retinopathy was discussed as well as the chance of vitreous hemorrhage for which further treatments may be necessary.   Explained to the patient that the quiescent  proliferative diabetic retinopathy disease is unlikely to ever worsen.  Worsening factors would include however severe anemia, hypertension out-of-control or impending renal failure.  Advanced nonexudative  age-related macular degeneration of both eyes without subfoveal involvement OS remained stable, OD does have an old subfoveal scar which is stable  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 choroidal neovascularization (HCC) OD, stable and active      ICD-10-CM   1. Advanced nonexudative age-related macular degeneration of both eyes without subfoveal involvement  H35.3133 OCT, Retina - OU - Both Eyes  2. Controlled type 2 diabetes mellitus with stable proliferative retinopathy of both eyes, with long-term current use of insulin (HCC)  E11.3553 OCT, Retina - OU - Both Eyes   Z79.4   3. Bilateral epiretinal membrane  H35.373   4. 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 choroidal neovascularization ([place])  H35.3212     1.  No signs of active maculopathy OU.  2.  No signs of progressive diabetic retinopathy OU.  3.  Ophthalmic Meds Ordered this visit:  No orders of the defined types were placed in this encounter.      Return in about 1 year (around 10/12/2020) for DILATE OU, OCT.  Patient Instructions  To report promptly if new onset visual acuity distortions or declines in either eye    Explained the diagnoses, plan, and follow up with the patient and they expressed understanding.  Patient expressed understanding of the importance of proper follow up care.   Clent Demark Ayasha Ellingsen M.D. Diseases & Surgery of the Retina and Vitreous Retina [facility] Diabetic [place] 10/13/19     Abbreviations: M myopia (nearsighted); A astigmatism; H hyperopia (farsighted); P presbyopia; Mrx spectacle prescription;  CTL contact lenses; OD right eye; OS left eye; OU both eyes  XT exotropia; ET esotropia; PEK punctate epithelial keratitis; PEE punctate epithelial erosions; DES dry eye syndrome; MGD meibomian gland dysfunction; ATs artificial tears; PFAT's preservative free artificial tears; [place] nuclear sclerotic cataract; PSC posterior  subcapsular  cataract; ERM epi-retinal membrane; PVD posterior vitreous detachment; RD retinal detachment; DM diabetes mellitus; DR diabetic retinopathy; NPDR non-proliferative diabetic retinopathy; PDR proliferative diabetic retinopathy; CSME clinically significant macular edema; DME diabetic macular edema; dbh dot blot hemorrhages; CWS cotton wool spot; POAG primary open angle glaucoma; C/D cup-to-disc ratio; HVF humphrey visual field; GVF goldmann visual field; OCT optical coherence tomography; IOP intraocular pressure; BRVO Branch retinal vein occlusion; CRVO central retinal vein occlusion; CRAO central retinal artery occlusion; BRAO branch retinal artery occlusion; RT retinal tear; SB scleral buckle; PPV pars plana vitrectomy; VH Vitreous hemorrhage; PRP panretinal laser photocoagulation; IVK intravitreal kenalog; VMT vitreomacular traction; MH Macular hole;  NVD neovascularization of the disc; NVE neovascularization elsewhere; AREDS age related eye disease study; ARMD age related macular degeneration; POAG primary open angle glaucoma; EBMD epithelial/anterior basement membrane dystrophy; ACIOL anterior chamber intraocular lens; IOL intraocular lens; PCIOL posterior chamber intraocular lens; Phaco/IOL phacoemulsification with intraocular lens placement; [place] photorefractive keratectomy; LASIK laser assisted in situ keratomileusis; HTN hypertension; DM diabetes mellitus; COPD chronic obstructive pulmonary disease

## 2019-10-13 NOTE — Assessment & Plan Note (Signed)
OD, stable and active

## 2019-10-16 DIAGNOSIS — E039 Hypothyroidism, unspecified: Secondary | ICD-10-CM | POA: Diagnosis not present

## 2019-10-16 DIAGNOSIS — Z6823 Body mass index (BMI) 23.0-23.9, adult: Secondary | ICD-10-CM | POA: Diagnosis not present

## 2019-10-16 DIAGNOSIS — N189 Chronic kidney disease, unspecified: Secondary | ICD-10-CM | POA: Diagnosis not present

## 2019-10-16 DIAGNOSIS — D649 Anemia, unspecified: Secondary | ICD-10-CM | POA: Diagnosis not present

## 2019-10-16 DIAGNOSIS — E782 Mixed hyperlipidemia: Secondary | ICD-10-CM | POA: Diagnosis not present

## 2019-10-16 DIAGNOSIS — D529 Folate deficiency anemia, unspecified: Secondary | ICD-10-CM | POA: Diagnosis not present

## 2019-10-16 DIAGNOSIS — Z23 Encounter for immunization: Secondary | ICD-10-CM | POA: Diagnosis not present

## 2019-10-16 DIAGNOSIS — E875 Hyperkalemia: Secondary | ICD-10-CM | POA: Diagnosis not present

## 2019-10-16 DIAGNOSIS — Z0001 Encounter for general adult medical examination with abnormal findings: Secondary | ICD-10-CM | POA: Diagnosis not present

## 2019-10-16 DIAGNOSIS — J8410 Pulmonary fibrosis, unspecified: Secondary | ICD-10-CM | POA: Diagnosis not present

## 2019-10-16 DIAGNOSIS — D519 Vitamin B12 deficiency anemia, unspecified: Secondary | ICD-10-CM | POA: Diagnosis not present

## 2019-10-16 DIAGNOSIS — D631 Anemia in chronic kidney disease: Secondary | ICD-10-CM | POA: Diagnosis not present

## 2019-10-16 DIAGNOSIS — E1121 Type 2 diabetes mellitus with diabetic nephropathy: Secondary | ICD-10-CM | POA: Diagnosis not present

## 2019-10-16 DIAGNOSIS — I10 Essential (primary) hypertension: Secondary | ICD-10-CM | POA: Diagnosis not present

## 2019-10-16 DIAGNOSIS — E1169 Type 2 diabetes mellitus with other specified complication: Secondary | ICD-10-CM | POA: Diagnosis not present

## 2019-10-21 DIAGNOSIS — N184 Chronic kidney disease, stage 4 (severe): Secondary | ICD-10-CM | POA: Diagnosis not present

## 2019-10-21 DIAGNOSIS — I700 Atherosclerosis of aorta: Secondary | ICD-10-CM | POA: Diagnosis not present

## 2019-10-21 DIAGNOSIS — E782 Mixed hyperlipidemia: Secondary | ICD-10-CM | POA: Diagnosis not present

## 2019-10-21 DIAGNOSIS — Z6823 Body mass index (BMI) 23.0-23.9, adult: Secondary | ICD-10-CM | POA: Diagnosis not present

## 2019-10-21 DIAGNOSIS — I10 Essential (primary) hypertension: Secondary | ICD-10-CM | POA: Diagnosis not present

## 2019-10-21 DIAGNOSIS — Z95828 Presence of other vascular implants and grafts: Secondary | ICD-10-CM | POA: Diagnosis not present

## 2019-10-21 DIAGNOSIS — E1021 Type 1 diabetes mellitus with diabetic nephropathy: Secondary | ICD-10-CM | POA: Diagnosis not present

## 2019-10-21 DIAGNOSIS — I5032 Chronic diastolic (congestive) heart failure: Secondary | ICD-10-CM | POA: Diagnosis not present

## 2019-10-22 DIAGNOSIS — Z9641 Presence of insulin pump (external) (internal): Secondary | ICD-10-CM | POA: Diagnosis not present

## 2019-10-22 DIAGNOSIS — Z7952 Long term (current) use of systemic steroids: Secondary | ICD-10-CM | POA: Diagnosis not present

## 2019-10-22 DIAGNOSIS — E1065 Type 1 diabetes mellitus with hyperglycemia: Secondary | ICD-10-CM | POA: Diagnosis not present

## 2019-10-22 DIAGNOSIS — E10649 Type 1 diabetes mellitus with hypoglycemia without coma: Secondary | ICD-10-CM | POA: Diagnosis not present

## 2019-10-22 DIAGNOSIS — Z7982 Long term (current) use of aspirin: Secondary | ICD-10-CM | POA: Diagnosis not present

## 2019-10-22 DIAGNOSIS — E271 Primary adrenocortical insufficiency: Secondary | ICD-10-CM | POA: Diagnosis not present

## 2019-10-22 DIAGNOSIS — E039 Hypothyroidism, unspecified: Secondary | ICD-10-CM | POA: Diagnosis not present

## 2019-10-22 DIAGNOSIS — E108 Type 1 diabetes mellitus with unspecified complications: Secondary | ICD-10-CM | POA: Diagnosis not present

## 2019-10-22 DIAGNOSIS — E785 Hyperlipidemia, unspecified: Secondary | ICD-10-CM | POA: Diagnosis not present

## 2019-10-23 DIAGNOSIS — L851 Acquired keratosis [keratoderma] palmaris et plantaris: Secondary | ICD-10-CM | POA: Diagnosis not present

## 2019-10-23 DIAGNOSIS — B351 Tinea unguium: Secondary | ICD-10-CM | POA: Diagnosis not present

## 2019-10-23 DIAGNOSIS — S90122A Contusion of left lesser toe(s) without damage to nail, initial encounter: Secondary | ICD-10-CM | POA: Diagnosis not present

## 2019-10-23 DIAGNOSIS — E1342 Other specified diabetes mellitus with diabetic polyneuropathy: Secondary | ICD-10-CM | POA: Diagnosis not present

## 2019-11-21 DIAGNOSIS — I5031 Acute diastolic (congestive) heart failure: Secondary | ICD-10-CM | POA: Diagnosis not present

## 2019-11-21 DIAGNOSIS — E1122 Type 2 diabetes mellitus with diabetic chronic kidney disease: Secondary | ICD-10-CM | POA: Diagnosis not present

## 2019-11-21 DIAGNOSIS — N184 Chronic kidney disease, stage 4 (severe): Secondary | ICD-10-CM | POA: Diagnosis not present

## 2019-11-21 DIAGNOSIS — I130 Hypertensive heart and chronic kidney disease with heart failure and stage 1 through stage 4 chronic kidney disease, or unspecified chronic kidney disease: Secondary | ICD-10-CM | POA: Diagnosis not present

## 2019-11-25 DIAGNOSIS — Z23 Encounter for immunization: Secondary | ICD-10-CM | POA: Diagnosis not present

## 2019-12-07 DIAGNOSIS — E039 Hypothyroidism, unspecified: Secondary | ICD-10-CM | POA: Diagnosis not present

## 2019-12-14 ENCOUNTER — Ambulatory Visit (INDEPENDENT_AMBULATORY_CARE_PROVIDER_SITE_OTHER): Payer: Medicare Other | Admitting: Physician Assistant

## 2019-12-14 ENCOUNTER — Ambulatory Visit (INDEPENDENT_AMBULATORY_CARE_PROVIDER_SITE_OTHER)
Admission: RE | Admit: 2019-12-14 | Discharge: 2019-12-14 | Disposition: A | Discharge status: Home / Self Care | Payer: Medicare Other | Source: Ambulatory Visit | Attending: Surgery | Admitting: Surgery

## 2019-12-14 ENCOUNTER — Other Ambulatory Visit: Payer: Self-pay

## 2019-12-14 ENCOUNTER — Ambulatory Visit (HOSPITAL_COMMUNITY)
Admission: RE | Admit: 2019-12-14 | Discharge: 2019-12-14 | Disposition: A | Discharge status: Home / Self Care | Payer: Medicare Other | Source: Ambulatory Visit | Attending: Surgery | Admitting: Surgery

## 2019-12-14 VITALS — BP 132/65 | HR 83 | Temp 98.3°F | Resp 20 | Ht 62.0 in | Wt 127.0 lb

## 2019-12-14 DIAGNOSIS — Z95828 Presence of other vascular implants and grafts: Secondary | ICD-10-CM | POA: Diagnosis not present

## 2019-12-14 DIAGNOSIS — I779 Disorder of arteries and arterioles, unspecified: Secondary | ICD-10-CM

## 2019-12-14 DIAGNOSIS — I70213 Atherosclerosis of native arteries of extremities with intermittent claudication, bilateral legs: Secondary | ICD-10-CM | POA: Diagnosis not present

## 2019-12-14 NOTE — Progress Notes (Signed)
Office Note     CC:  follow up Requesting Provider:  Curlene Labrum, MD  HPI: Kendra Walker is a 69 y.o. ([birth year]) female who presents follow up of peripheral vascular disease. She has remote history of a right femoral to above knee popliteal bypass with vein in 2008 in [place]. She subsequently was evaluated here and has more recently undergone Abdominal aortogram, bilateral lower extremity runoff with stenting of her right common iliac artery and stenting of her left common iliac artery on 06/09/19 by Dr. Trula Slade for disabling claudication. At time of her last follow up here claudication symptoms were resolved.  Presently she denies any lower extremity pain. States that her claudication symptoms remain resolved. She denies any pain at rest and no non healing wounds. She has been compliant with her Aspirin, Statin and Plavix  The pt is on a statin for cholesterol management.  The pt is on a daily aspirin.   Other AC:  Plavix The pt not on medication for hypertension.   The pt is diabetic.  On insulin pump Tobacco hx: Non smoker  Past Medical History:  Diagnosis Date  . Cancer (HCC)    Squamous Cell  . Diabetes mellitus without complication ([place])   . Hyperlipemia   . Hypertension   . Hypothyroidism   . PAD (peripheral artery disease) ([place])     Past Surgical History:  Procedure Laterality Date  . ABDOMINAL AORTOGRAM W/LOWER EXTREMITY N/A 06/09/2019   Procedure: ABDOMINAL AORTOGRAM W/LOWER EXTREMITY;  Surgeon: Serafina Mitchell, MD;  Location: [facility];  Service: Cardiovascular;  Laterality: N/A;  . APPENDECTOMY    . CATARACT EXTRACTION Bilateral   . CORONARY ARTERY BYPASS GRAFT    . PERIPHERAL VASCULAR INTERVENTION Bilateral 06/09/2019   Procedure: PERIPHERAL VASCULAR INTERVENTION;  Surgeon: Serafina Mitchell, MD;  Location: [facility];  Service: Cardiovascular;  Laterality: Bilateral;  . TONSILLECTOMY    . YAG LASER APPLICATION Right 2/72/5366    Procedure: YAG LASER APPLICATION;  Surgeon: Williams Che, MD;  Location: [facility];  Service: Ophthalmology;  Laterality: Right;    Social History   Socioeconomic History  . Marital status: Married    Spouse name: Not on file  . Number of children: Not on file  . Years of education: Not on file  . Highest education level: Not on file  Occupational History  . Not on file  Tobacco Use  . Smoking status: Never Smoker  . Smokeless tobacco: Never Used  Vaping Use  . Vaping Use: Never used  Substance and Sexual Activity  . Alcohol use: No    Alcohol/week: 0.0 standard drinks  . Drug use: No  . Sexual activity: Not on file  Other Topics Concern  . Not on file  Social History Narrative  . Not on file   Social Determinants of Health   Financial Resource Strain:   . Difficulty of Paying Living Expenses: Not on file  Food Insecurity:   . Worried About Charity fundraiser in the Last Year: Not on file  . Ran Out of Food in the Last Year: Not on file  Transportation Needs:   . Lack of Transportation (Medical): Not on file  . Lack of Transportation (Non-Medical): Not on file  Physical Activity:   . Days of Exercise per Week: Not on file  . Minutes of Exercise per Session: Not on file  Stress:   . Feeling of Stress : Not on file  Social Connections:   .  Frequency of Communication with Friends and Family: Not on file  . Frequency of Social Gatherings with Friends and Family: Not on file  . Attends Religious Services: Not on file  . Active Member of Clubs or Organizations: Not on file  . Attends Archivist Meetings: Not on file  . Marital Status: Not on file  Intimate Partner Violence:   . Fear of Current or Ex-Partner: Not on file  . Emotionally Abused: Not on file  . Physically Abused: Not on file  . Sexually Abused: Not on file    Family History  Problem Relation Age of Onset  . Heart disease Mother   . Hypertension Mother   . Hypertension Father      Current Outpatient Medications  Medication Sig Dispense Refill  . acetaminophen (TYLENOL) 500 MG tablet Take 500 mg by mouth every 6 (six) hours as needed for mild pain or headache.     . Ascorbic Acid (VITAMIN C WITH ROSE HIPS) 500 MG tablet Take 500 mg by mouth at bedtime.    Marland Kitchen aspirin EC 81 MG tablet Take 81 mg by mouth daily.    Marland Kitchen atorvastatin (LIPITOR) 80 MG tablet Take 1 tablet (80 mg total) by mouth daily. (Patient taking differently: Take 80 mg by mouth every evening. ) 30 tablet 6  . Calcium Carbonate (CALCIUM 600 PO) Take 600 mg by mouth in the morning and at bedtime.    . Cholecalciferol (VITAMIN D3) 50 MCG (2000 UT) TABS Take 2,000 Units by mouth daily.    . clopidogrel (PLAVIX) 75 MG tablet Take 1 tablet (75 mg total) by mouth daily. 30 tablet 11  . fludrocortisone (FLORINEF) 0.1 MG tablet Take 0.05 mg by mouth daily.     Marland Kitchen HUMALOG 100 UNIT/ML injection Inject into the skin See admin instructions. VIA INSULIN PUMP    . Insulin Human (INSULIN PUMP) SOLN Inject into the skin. HUMALOG 100 UNIT/ML    . iron polysaccharides (FERREX 150) 150 MG capsule Take 150 mg by mouth at bedtime.     Marland Kitchen levothyroxine (SYNTHROID) 112 MCG tablet Take by mouth.    . magnesium chloride (SLOW-MAG) 64 MG TBEC SR tablet Take 1-2 tablets by mouth See admin instructions. TAKE 1 TABLET IN THE MORNING & TAKE 2 TABLETS AT NIGHT.    . Multiple Vitamin (MULTIVITAMIN WITH MINERALS) TABS tablet Take 1 tablet by mouth daily.    . Multiple Vitamins-Minerals (PRESERVISION AREDS 2 PO) Take 1 tablet by mouth daily.     . Omega-3 Fatty Acids (FISH OIL) 1200 MG CAPS Take 1,200 mg by mouth daily.    . predniSONE (DELTASONE) 5 MG tablet Take 5 mg by mouth daily with breakfast.    . sodium bicarbonate 650 MG tablet Take 650 mg by mouth in the morning and at bedtime.    . VELTASSA 8.4 g packet Take 8.4 g by mouth every Wednesday.   5   No current facility-administered medications for this visit.    Allergies   Allergen Reactions  . Bactrim [Sulfamethoxazole-Trimethoprim] Other (See Comments)    Elevates potassium.   . Tape Other (See Comments)    not allergic but causes sensitivity.  . Fluorescein Rash  . Procrit [Epoetin (Alfa)] Rash     REVIEW OF SYSTEMS:  [X]  denotes positive finding, [ ]  denotes negative finding Cardiac  Comments:  Chest pain or chest pressure:    Shortness of breath upon exertion:    Short of breath when lying flat:  Irregular heart rhythm:        Vascular    Pain in calf, thigh, or hip brought on by ambulation:    Pain in feet at night that wakes you up from your sleep:     Blood clot in your veins:    Leg swelling:         Pulmonary    Oxygen at home:    Productive cough:     Wheezing:         Neurologic    Sudden weakness in arms or legs:     Sudden numbness in arms or legs:     Sudden onset of difficulty speaking or slurred speech:    Temporary loss of vision in one eye:     Problems with dizziness:         Gastrointestinal    Blood in stool:     Vomited blood:         Genitourinary    Burning when urinating:     Blood in urine:        Psychiatric    Major depression:         Hematologic    Bleeding problems:    Problems with blood clotting too easily:        Skin    Rashes or ulcers:        Constitutional    Fever or chills:      PHYSICAL EXAMINATION:  Vitals:   12/14/19 1024  BP: 132/65  Pulse: 83  Resp: 20  Temp: 98.3 F (36.8 C)  SpO2: 96%  Weight: 127 lb (57.6 kg)  Height: 5\' 2"  (1.575 m)    General:  WDWN in NAD; vital signs documented above Gait: Normal HENT: WNL, normocephalic Pulmonary: normal non-labored breathing , without wheezing Cardiac: regular HR, without  Murmurs without carotid bruit Abdomen: soft, NT, no masses Vascular Exam/Pulses:  Right Left  Radial 2+ (normal) 2+ (normal)  Femoral 2+ (normal) 2+ (normal)  Popliteal Not palpable Not palpable  DP 2+ (normal) 2+ (normal)  PT 2+ (normal) 1+  (weak)   Extremities: without ischemic changes, without Gangrene , without cellulitis; without open wounds;  Musculoskeletal: no muscle wasting or atrophy  Neurologic: A&O X 3;  No focal weakness or paresthesias are detected Psychiatric:  The pt has Normal affect.   Non-Invasive Vascular Imaging:  12/14/19 +-------+-----------+-----------+----------------+------------+  ABI/TBIToday's ABIToday's TBIPrevious ABI  Previous TBI  +-------+-----------+-----------+----------------+------------+  Right 1.21    0.53    non compressible1.09      +-------+-----------+-----------+----------------+------------+  Left  1.32    0.61    non compressible1.31      +-------+-----------+-----------+----------------+------------+  RT great toe pressure = 69 mmHg.  LT Great toe pressure = 79 mmHg.   VAS US Aorta/IVC/Iliacs: Independently reviewed and she has no aortic stenosis, bilateral common iliac artery stents are patent without stenosis   ASSESSMENT/PLAN:: 69 y.o. female here for follow up for peripheral vascular disease. She has remote history of a right femoral to above knee popliteal bypass with vein in 2008 in [place]. She subsequently was evaluated here and has more recently undergone Abdominal aortogram, bilateral lower extremity runoff with stenting of her right common iliac artery and stenting of her left common iliac artery on 06/09/19 by Dr. Trula Slade for disabling claudication. Since her intervention her symptoms have remained resolved. Her ABI's are stable today. She has improved toe pressures bilaterally. Her Duplex of her iliac stents and aorto show patent common iliac artery stents bilaterally  and no aortic stenosis. - She has been on her Plavix for 6 months now, she can discontinue this. She will continue her Aspirin 81 mg daily as well as statin - I have discussed with her following up sooner should she have new or concerning symptoms - She will  follow up in 6 months ABIs and Aortoiliac duplex   Kendra Caldwell, PA-C [facility] [phone]  Clinic MD:   Dr. Carlis Abbott

## 2020-01-01 DIAGNOSIS — E039 Hypothyroidism, unspecified: Secondary | ICD-10-CM | POA: Diagnosis not present

## 2020-01-18 DIAGNOSIS — I10 Essential (primary) hypertension: Secondary | ICD-10-CM | POA: Diagnosis not present

## 2020-01-18 DIAGNOSIS — E1121 Type 2 diabetes mellitus with diabetic nephropathy: Secondary | ICD-10-CM | POA: Diagnosis not present

## 2020-01-18 DIAGNOSIS — E039 Hypothyroidism, unspecified: Secondary | ICD-10-CM | POA: Diagnosis not present

## 2020-01-18 DIAGNOSIS — E875 Hyperkalemia: Secondary | ICD-10-CM | POA: Diagnosis not present

## 2020-01-18 DIAGNOSIS — N189 Chronic kidney disease, unspecified: Secondary | ICD-10-CM | POA: Diagnosis not present

## 2020-01-20 DIAGNOSIS — E782 Mixed hyperlipidemia: Secondary | ICD-10-CM | POA: Diagnosis not present

## 2020-01-20 DIAGNOSIS — E875 Hyperkalemia: Secondary | ICD-10-CM | POA: Diagnosis not present

## 2020-01-20 DIAGNOSIS — D631 Anemia in chronic kidney disease: Secondary | ICD-10-CM | POA: Diagnosis not present

## 2020-01-20 DIAGNOSIS — E2749 Other adrenocortical insufficiency: Secondary | ICD-10-CM | POA: Diagnosis not present

## 2020-01-20 DIAGNOSIS — I10 Essential (primary) hypertension: Secondary | ICD-10-CM | POA: Diagnosis not present

## 2020-01-20 DIAGNOSIS — Z6823 Body mass index (BMI) 23.0-23.9, adult: Secondary | ICD-10-CM | POA: Diagnosis not present

## 2020-01-20 DIAGNOSIS — E1021 Type 1 diabetes mellitus with diabetic nephropathy: Secondary | ICD-10-CM | POA: Diagnosis not present

## 2020-01-20 DIAGNOSIS — N184 Chronic kidney disease, stage 4 (severe): Secondary | ICD-10-CM | POA: Diagnosis not present

## 2020-01-22 DIAGNOSIS — I130 Hypertensive heart and chronic kidney disease with heart failure and stage 1 through stage 4 chronic kidney disease, or unspecified chronic kidney disease: Secondary | ICD-10-CM | POA: Diagnosis not present

## 2020-01-22 DIAGNOSIS — N184 Chronic kidney disease, stage 4 (severe): Secondary | ICD-10-CM | POA: Diagnosis not present

## 2020-01-22 DIAGNOSIS — E1122 Type 2 diabetes mellitus with diabetic chronic kidney disease: Secondary | ICD-10-CM | POA: Diagnosis not present

## 2020-01-22 DIAGNOSIS — I5031 Acute diastolic (congestive) heart failure: Secondary | ICD-10-CM | POA: Diagnosis not present

## 2020-02-15 DIAGNOSIS — Z20828 Contact with and (suspected) exposure to other viral communicable diseases: Secondary | ICD-10-CM | POA: Diagnosis not present

## 2020-02-15 DIAGNOSIS — R059 Cough, unspecified: Secondary | ICD-10-CM | POA: Diagnosis not present

## 2020-02-26 DIAGNOSIS — L851 Acquired keratosis [keratoderma] palmaris et plantaris: Secondary | ICD-10-CM | POA: Diagnosis not present

## 2020-02-26 DIAGNOSIS — B351 Tinea unguium: Secondary | ICD-10-CM | POA: Diagnosis not present

## 2020-02-26 DIAGNOSIS — E1342 Other specified diabetes mellitus with diabetic polyneuropathy: Secondary | ICD-10-CM | POA: Diagnosis not present

## 2020-02-29 DIAGNOSIS — R404 Transient alteration of awareness: Secondary | ICD-10-CM | POA: Diagnosis not present

## 2020-02-29 DIAGNOSIS — R456 Violent behavior: Secondary | ICD-10-CM | POA: Diagnosis not present

## 2020-02-29 DIAGNOSIS — E161 Other hypoglycemia: Secondary | ICD-10-CM | POA: Diagnosis not present

## 2020-02-29 DIAGNOSIS — R61 Generalized hyperhidrosis: Secondary | ICD-10-CM | POA: Diagnosis not present

## 2020-02-29 DIAGNOSIS — E162 Hypoglycemia, unspecified: Secondary | ICD-10-CM | POA: Diagnosis not present

## 2020-03-21 DIAGNOSIS — I5031 Acute diastolic (congestive) heart failure: Secondary | ICD-10-CM | POA: Diagnosis not present

## 2020-03-21 DIAGNOSIS — I130 Hypertensive heart and chronic kidney disease with heart failure and stage 1 through stage 4 chronic kidney disease, or unspecified chronic kidney disease: Secondary | ICD-10-CM | POA: Diagnosis not present

## 2020-03-21 DIAGNOSIS — N184 Chronic kidney disease, stage 4 (severe): Secondary | ICD-10-CM | POA: Diagnosis not present

## 2020-03-21 DIAGNOSIS — E1122 Type 2 diabetes mellitus with diabetic chronic kidney disease: Secondary | ICD-10-CM | POA: Diagnosis not present

## 2020-04-08 DIAGNOSIS — E7849 Other hyperlipidemia: Secondary | ICD-10-CM | POA: Diagnosis not present

## 2020-04-08 DIAGNOSIS — E782 Mixed hyperlipidemia: Secondary | ICD-10-CM | POA: Diagnosis not present

## 2020-04-08 DIAGNOSIS — E1169 Type 2 diabetes mellitus with other specified complication: Secondary | ICD-10-CM | POA: Diagnosis not present

## 2020-04-08 DIAGNOSIS — E039 Hypothyroidism, unspecified: Secondary | ICD-10-CM | POA: Diagnosis not present

## 2020-04-08 DIAGNOSIS — I10 Essential (primary) hypertension: Secondary | ICD-10-CM | POA: Diagnosis not present

## 2020-04-15 DIAGNOSIS — I10 Essential (primary) hypertension: Secondary | ICD-10-CM | POA: Diagnosis not present

## 2020-04-15 DIAGNOSIS — E1021 Type 1 diabetes mellitus with diabetic nephropathy: Secondary | ICD-10-CM | POA: Diagnosis not present

## 2020-04-15 DIAGNOSIS — E2749 Other adrenocortical insufficiency: Secondary | ICD-10-CM | POA: Diagnosis not present

## 2020-04-15 DIAGNOSIS — E875 Hyperkalemia: Secondary | ICD-10-CM | POA: Diagnosis not present

## 2020-04-15 DIAGNOSIS — E039 Hypothyroidism, unspecified: Secondary | ICD-10-CM | POA: Diagnosis not present

## 2020-04-15 DIAGNOSIS — E7849 Other hyperlipidemia: Secondary | ICD-10-CM | POA: Diagnosis not present

## 2020-04-15 DIAGNOSIS — D631 Anemia in chronic kidney disease: Secondary | ICD-10-CM | POA: Diagnosis not present

## 2020-04-15 DIAGNOSIS — I739 Peripheral vascular disease, unspecified: Secondary | ICD-10-CM | POA: Diagnosis not present

## 2020-04-21 DIAGNOSIS — E1065 Type 1 diabetes mellitus with hyperglycemia: Secondary | ICD-10-CM | POA: Diagnosis not present

## 2020-04-21 DIAGNOSIS — Z9641 Presence of insulin pump (external) (internal): Secondary | ICD-10-CM | POA: Diagnosis not present

## 2020-04-21 DIAGNOSIS — Z79899 Other long term (current) drug therapy: Secondary | ICD-10-CM | POA: Diagnosis not present

## 2020-04-21 DIAGNOSIS — E038 Other specified hypothyroidism: Secondary | ICD-10-CM | POA: Diagnosis not present

## 2020-04-21 DIAGNOSIS — Z7982 Long term (current) use of aspirin: Secondary | ICD-10-CM | POA: Diagnosis not present

## 2020-04-21 DIAGNOSIS — E039 Hypothyroidism, unspecified: Secondary | ICD-10-CM | POA: Diagnosis not present

## 2020-04-21 DIAGNOSIS — E063 Autoimmune thyroiditis: Secondary | ICD-10-CM | POA: Diagnosis not present

## 2020-04-21 DIAGNOSIS — E271 Primary adrenocortical insufficiency: Secondary | ICD-10-CM | POA: Diagnosis not present

## 2020-04-21 DIAGNOSIS — Z794 Long term (current) use of insulin: Secondary | ICD-10-CM | POA: Diagnosis not present

## 2020-04-21 DIAGNOSIS — I10 Essential (primary) hypertension: Secondary | ICD-10-CM | POA: Diagnosis not present

## 2020-04-21 DIAGNOSIS — Z7952 Long term (current) use of systemic steroids: Secondary | ICD-10-CM | POA: Diagnosis not present

## 2020-04-21 DIAGNOSIS — E10649 Type 1 diabetes mellitus with hypoglycemia without coma: Secondary | ICD-10-CM | POA: Diagnosis not present

## 2020-04-25 DIAGNOSIS — S161XXA Strain of muscle, fascia and tendon at neck level, initial encounter: Secondary | ICD-10-CM | POA: Diagnosis not present

## 2020-04-25 DIAGNOSIS — Z6824 Body mass index (BMI) 24.0-24.9, adult: Secondary | ICD-10-CM | POA: Diagnosis not present

## 2020-05-06 DIAGNOSIS — E1342 Other specified diabetes mellitus with diabetic polyneuropathy: Secondary | ICD-10-CM | POA: Diagnosis not present

## 2020-05-06 DIAGNOSIS — L84 Corns and callosities: Secondary | ICD-10-CM | POA: Diagnosis not present

## 2020-05-06 DIAGNOSIS — B351 Tinea unguium: Secondary | ICD-10-CM | POA: Diagnosis not present

## 2020-05-27 ENCOUNTER — Other Ambulatory Visit: Payer: Self-pay | Admitting: *Deleted

## 2020-05-27 DIAGNOSIS — I779 Disorder of arteries and arterioles, unspecified: Secondary | ICD-10-CM

## 2020-05-27 DIAGNOSIS — I70213 Atherosclerosis of native arteries of extremities with intermittent claudication, bilateral legs: Secondary | ICD-10-CM

## 2020-05-27 DIAGNOSIS — Z95828 Presence of other vascular implants and grafts: Secondary | ICD-10-CM

## 2020-06-15 ENCOUNTER — Ambulatory Visit (INDEPENDENT_AMBULATORY_CARE_PROVIDER_SITE_OTHER)
Admission: RE | Admit: 2020-06-15 | Discharge: 2020-06-15 | Disposition: A | Discharge status: Home / Self Care | Payer: Medicare Other | Source: Ambulatory Visit | Attending: Vascular Surgery | Admitting: Vascular Surgery

## 2020-06-15 ENCOUNTER — Ambulatory Visit (HOSPITAL_COMMUNITY)
Admission: RE | Admit: 2020-06-15 | Discharge: 2020-06-15 | Disposition: A | Discharge status: Home / Self Care | Payer: Medicare Other | Source: Ambulatory Visit | Attending: Vascular Surgery | Admitting: Vascular Surgery

## 2020-06-15 ENCOUNTER — Other Ambulatory Visit: Payer: Self-pay

## 2020-06-15 ENCOUNTER — Ambulatory Visit (INDEPENDENT_AMBULATORY_CARE_PROVIDER_SITE_OTHER): Payer: Medicare Other | Admitting: Physician Assistant

## 2020-06-15 VITALS — BP 156/67 | HR 72 | Temp 97.2°F | Resp 20 | Ht 62.0 in | Wt 130.8 lb

## 2020-06-15 DIAGNOSIS — I779 Disorder of arteries and arterioles, unspecified: Secondary | ICD-10-CM | POA: Insufficient documentation

## 2020-06-15 DIAGNOSIS — Z95828 Presence of other vascular implants and grafts: Secondary | ICD-10-CM

## 2020-06-15 DIAGNOSIS — I70213 Atherosclerosis of native arteries of extremities with intermittent claudication, bilateral legs: Secondary | ICD-10-CM | POA: Insufficient documentation

## 2020-06-15 NOTE — Progress Notes (Signed)
Office Note     CC:  follow up Requesting Provider:  Curlene Labrum, MD  HPI: Kendra Walker is a 70 y.o. (Dec 28, 1950) female who presents for follow up of peripheral vascular disease. She has remote history of a right femoral to above knee popliteal bypass with vein in 2008 in [place]. She has more recently has stenting of her right common iliac artery and stenting of her left common iliac artery on 06/09/19 by Dr. Trula Slade for disabling claudication. She has been doing well since with resolution of her symptoms  Today she denies any lower extremity pain. States that her claudication symptoms remain resolved. She denies any pain at rest and no non healing wounds. She says she is able to walk her dogs without any discomfort. She has been compliant with her Aspirin and Statin   The ptison a statin for cholesterol management.  The ptison a daily aspirin. Other AC: none The ptnoton medicationfor hypertension.  The ptisdiabetic.On insulin pump Tobacco hx:Non smoker  Past Medical History:  Diagnosis Date  . Cancer (HCC)    Squamous Cell  . Diabetes mellitus without complication ([place])   . Hyperlipemia   . Hypertension   . Hypothyroidism   . PAD (peripheral artery disease) ([place])     Past Surgical History:  Procedure Laterality Date  . ABDOMINAL AORTOGRAM W/LOWER EXTREMITY N/A 06/09/2019   Procedure: ABDOMINAL AORTOGRAM W/LOWER EXTREMITY;  Surgeon: Serafina Mitchell, MD;  Location: [facility];  Service: Cardiovascular;  Laterality: N/A;  . APPENDECTOMY    . CATARACT EXTRACTION Bilateral   . CORONARY ARTERY BYPASS GRAFT    . PERIPHERAL VASCULAR INTERVENTION Bilateral 06/09/2019   Procedure: PERIPHERAL VASCULAR INTERVENTION;  Surgeon: Serafina Mitchell, MD;  Location: [facility];  Service: Cardiovascular;  Laterality: Bilateral;  . TONSILLECTOMY    . YAG LASER APPLICATION Right 1/44/3154   Procedure: YAG LASER APPLICATION;  Surgeon: Williams Che, MD;   Location: [facility];  Service: Ophthalmology;  Laterality: Right;    Social History   Socioeconomic History  . Marital status: Married    Spouse name: Not on file  . Number of children: Not on file  . Years of education: Not on file  . Highest education level: Not on file  Occupational History  . Not on file  Tobacco Use  . Smoking status: Never Smoker  . Smokeless tobacco: Never Used  Vaping Use  . Vaping Use: Never used  Substance and Sexual Activity  . Alcohol use: No    Alcohol/week: 0.0 standard drinks  . Drug use: No  . Sexual activity: Not on file  Other Topics Concern  . Not on file  Social History Narrative  . Not on file   Social Determinants of Health   Financial Resource Strain: Not on file  Food Insecurity: Not on file  Transportation Needs: Not on file  Physical Activity: Not on file  Stress: Not on file  Social Connections: Not on file  Intimate Partner Violence: Not on file    Family History  Problem Relation Age of Onset  . Heart disease Mother   . Hypertension Mother   . Hypertension Father     Current Outpatient Medications  Medication Sig Dispense Refill  . acetaminophen (TYLENOL) 500 MG tablet Take 500 mg by mouth every 6 (six) hours as needed for mild pain or headache.     . Ascorbic Acid (VITAMIN C WITH ROSE HIPS) 500 MG tablet Take 500 mg by mouth  at bedtime.    Marland Kitchen aspirin EC 81 MG tablet Take 81 mg by mouth daily.    Marland Kitchen atorvastatin (LIPITOR) 80 MG tablet Take 1 tablet (80 mg total) by mouth daily. (Patient taking differently: Take 80 mg by mouth every evening.) 30 tablet 6  . Calcium Carbonate (CALCIUM 600 PO) Take 600 mg by mouth in the morning and at bedtime.    . Cholecalciferol (VITAMIN D3) 50 MCG (2000 UT) TABS Take 2,000 Units by mouth daily.    . fludrocortisone (FLORINEF) 0.1 MG tablet Take 0.05 mg by mouth daily.    Marland Kitchen GLUCAGEN HYPOKIT 1 MG SOLR injection Inject into the muscle.    Marland Kitchen HUMALOG 100 UNIT/ML injection Inject into the  skin See admin instructions. VIA INSULIN PUMP    . Insulin Human (INSULIN PUMP) SOLN Inject into the skin. HUMALOG 100 UNIT/ML    . iron polysaccharides (NIFEREX) 150 MG capsule Take 150 mg by mouth at bedtime.     Marland Kitchen levothyroxine (SYNTHROID) 112 MCG tablet Take by mouth.    . magnesium chloride (SLOW-MAG) 64 MG TBEC SR tablet Take 1-2 tablets by mouth See admin instructions. TAKE 1 TABLET IN THE MORNING & TAKE 2 TABLETS AT NIGHT.    . Multiple Vitamin (MULTIVITAMIN WITH MINERALS) TABS tablet Take 1 tablet by mouth daily.    . Multiple Vitamins-Minerals (PRESERVISION AREDS 2 PO) Take 1 tablet by mouth daily.     . Omega-3 Fatty Acids (FISH OIL) 1200 MG CAPS Take 1,200 mg by mouth daily.    Marland Kitchen POLY-IRON 150 FORTE 150-25-1 MG-MCG-MG CAPS Take 1 capsule by mouth 2 (two) times daily.    . predniSONE (DELTASONE) 5 MG tablet Take 5 mg by mouth daily with breakfast.    . sodium bicarbonate 650 MG tablet Take 650 mg by mouth in the morning and at bedtime.    . VELTASSA 8.4 g packet Take 8.4 g by mouth every Wednesday.   5   No current facility-administered medications for this visit.    Allergies  Allergen Reactions  . Bactrim [Sulfamethoxazole-Trimethoprim] Other (See Comments)    Elevates potassium.   . Tape Other (See Comments)    not allergic but causes sensitivity.  . Fluorescein Rash  . Procrit [Epoetin (Alfa)] Rash     REVIEW OF SYSTEMS:  [X]  denotes positive finding, [ ]  denotes negative finding Cardiac  Comments:  Chest pain or chest pressure:    Shortness of breath upon exertion:    Short of breath when lying flat:    Irregular heart rhythm:        Vascular    Pain in calf, thigh, or hip brought on by ambulation:    Pain in feet at night that wakes you up from your sleep:     Blood clot in your veins:    Leg swelling:         Pulmonary    Oxygen at home:    Productive cough:     Wheezing:         Neurologic    Sudden weakness in arms or legs:     Sudden numbness in  arms or legs:     Sudden onset of difficulty speaking or slurred speech:    Temporary loss of vision in one eye:     Problems with dizziness:         Gastrointestinal    Blood in stool:     Vomited blood:  Genitourinary    Burning when urinating:     Blood in urine:        Psychiatric    Major depression:         Hematologic    Bleeding problems:    Problems with blood clotting too easily:        Skin    Rashes or ulcers:        Constitutional    Fever or chills:      PHYSICAL EXAMINATION:  Vitals:   06/15/20 0938  BP: (!) 156/67  Pulse: 72  Resp: 20  Temp: (!) 97.2 F (36.2 C)  TempSrc: Temporal  SpO2: 98%  Weight: 130 lb 12.8 oz (59.3 kg)  Height: 5\' 2"  (1.575 m)    General:  WDWN in NAD; vital signs documented above Gait: Normal, ambulates with cane HENT: WNL, normocephalic Pulmonary: normal non-labored breathing , without wheezing Cardiac: regular HR, without  Murmurs without carotid bruit Abdomen: soft, NT, no masses Vascular Exam/Pulses:  Right Left  Radial 2+ (normal) 2+ (normal)  Femoral 2+ (normal) 2+ (normal)  Popliteal Not palpable Not palpable  DP 2+ (normal) 2+ (normal)  PT Not palpable Not palpable   Extremities: without ischemic changes, without Gangrene , without cellulitis; without open wounds;  Musculoskeletal: no muscle wasting or atrophy  Neurologic: A&O X 3;  No focal weakness or paresthesias are detected Psychiatric:  The pt has Normal affect.   Non-Invasive Vascular Imaging:   +-------+-----------+-----------+------------+------------+  ABI/TBIToday's ABIToday's TBIPrevious ABIPrevious TBI  +-------+-----------+-----------+------------+------------+  Right 1.20    0.66    1.21    0.53      +-------+-----------+-----------+------------+------------+  Left  1.21    0.82    1.32    0.61      +-------+-----------+-----------+------------+------------+   VAS US  Aorta/IVC/iliacs: 06/15/20 Summary: Patent common iliac artery stents with no evidence of restenosis.    ASSESSMENT/PLAN:: 70 y.o. female here for follow up for follow up of peripheral vascular disease. She has history of right femoral to popliteal bypass and bilateral common iliac artery stents. She is without claudication, rest pain or tissue loss. Her ABIs are essentially unchanged. Her bilateral iliac duplex shows patent stents. - She will continue her Aspirin and Statin - She knows should she have new or concerning symptoms to call for earlier follow up - she will follow up in 1 year with ABI, Abdominal/ivc/iliac duplex and RLE bypass graft duplex   Karoline Caldwell, PA-C [facility] [phone]  Clinic MD:  Scot Dock

## 2020-06-28 DIAGNOSIS — E872 Acidosis: Secondary | ICD-10-CM | POA: Diagnosis not present

## 2020-06-28 DIAGNOSIS — D631 Anemia in chronic kidney disease: Secondary | ICD-10-CM | POA: Diagnosis not present

## 2020-06-28 DIAGNOSIS — N1831 Chronic kidney disease, stage 3a: Secondary | ICD-10-CM | POA: Diagnosis not present

## 2020-06-28 DIAGNOSIS — Z9641 Presence of insulin pump (external) (internal): Secondary | ICD-10-CM | POA: Diagnosis not present

## 2020-06-28 DIAGNOSIS — E875 Hyperkalemia: Secondary | ICD-10-CM | POA: Diagnosis not present

## 2020-06-28 DIAGNOSIS — I129 Hypertensive chronic kidney disease with stage 1 through stage 4 chronic kidney disease, or unspecified chronic kidney disease: Secondary | ICD-10-CM | POA: Diagnosis not present

## 2020-06-28 DIAGNOSIS — E1022 Type 1 diabetes mellitus with diabetic chronic kidney disease: Secondary | ICD-10-CM | POA: Diagnosis not present

## 2020-06-28 DIAGNOSIS — Z951 Presence of aortocoronary bypass graft: Secondary | ICD-10-CM | POA: Diagnosis not present

## 2020-06-28 DIAGNOSIS — E271 Primary adrenocortical insufficiency: Secondary | ICD-10-CM | POA: Diagnosis not present

## 2020-06-28 DIAGNOSIS — Z7952 Long term (current) use of systemic steroids: Secondary | ICD-10-CM | POA: Diagnosis not present

## 2020-07-19 DIAGNOSIS — E1342 Other specified diabetes mellitus with diabetic polyneuropathy: Secondary | ICD-10-CM | POA: Diagnosis not present

## 2020-07-19 DIAGNOSIS — L84 Corns and callosities: Secondary | ICD-10-CM | POA: Diagnosis not present

## 2020-07-19 DIAGNOSIS — B351 Tinea unguium: Secondary | ICD-10-CM | POA: Diagnosis not present

## 2020-07-23 DIAGNOSIS — Z20822 Contact with and (suspected) exposure to covid-19: Secondary | ICD-10-CM | POA: Diagnosis not present

## 2020-07-27 DIAGNOSIS — D649 Anemia, unspecified: Secondary | ICD-10-CM | POA: Diagnosis not present

## 2020-07-27 DIAGNOSIS — E7849 Other hyperlipidemia: Secondary | ICD-10-CM | POA: Diagnosis not present

## 2020-07-27 DIAGNOSIS — E039 Hypothyroidism, unspecified: Secondary | ICD-10-CM | POA: Diagnosis not present

## 2020-07-27 DIAGNOSIS — E1121 Type 2 diabetes mellitus with diabetic nephropathy: Secondary | ICD-10-CM | POA: Diagnosis not present

## 2020-07-27 DIAGNOSIS — E782 Mixed hyperlipidemia: Secondary | ICD-10-CM | POA: Diagnosis not present

## 2020-07-27 DIAGNOSIS — N184 Chronic kidney disease, stage 4 (severe): Secondary | ICD-10-CM | POA: Diagnosis not present

## 2020-07-27 DIAGNOSIS — I10 Essential (primary) hypertension: Secondary | ICD-10-CM | POA: Diagnosis not present

## 2020-07-27 DIAGNOSIS — E559 Vitamin D deficiency, unspecified: Secondary | ICD-10-CM | POA: Diagnosis not present

## 2020-07-27 DIAGNOSIS — R5383 Other fatigue: Secondary | ICD-10-CM | POA: Diagnosis not present

## 2020-08-02 DIAGNOSIS — E875 Hyperkalemia: Secondary | ICD-10-CM | POA: Diagnosis not present

## 2020-08-02 DIAGNOSIS — J8410 Pulmonary fibrosis, unspecified: Secondary | ICD-10-CM | POA: Diagnosis not present

## 2020-08-02 DIAGNOSIS — I10 Essential (primary) hypertension: Secondary | ICD-10-CM | POA: Diagnosis not present

## 2020-08-02 DIAGNOSIS — D631 Anemia in chronic kidney disease: Secondary | ICD-10-CM | POA: Diagnosis not present

## 2020-08-02 DIAGNOSIS — E1021 Type 1 diabetes mellitus with diabetic nephropathy: Secondary | ICD-10-CM | POA: Diagnosis not present

## 2020-08-02 DIAGNOSIS — E039 Hypothyroidism, unspecified: Secondary | ICD-10-CM | POA: Diagnosis not present

## 2020-08-02 DIAGNOSIS — N184 Chronic kidney disease, stage 4 (severe): Secondary | ICD-10-CM | POA: Diagnosis not present

## 2020-08-02 DIAGNOSIS — E7849 Other hyperlipidemia: Secondary | ICD-10-CM | POA: Diagnosis not present

## 2020-08-11 DIAGNOSIS — K5792 Diverticulitis of intestine, part unspecified, without perforation or abscess without bleeding: Secondary | ICD-10-CM | POA: Diagnosis not present

## 2020-08-11 DIAGNOSIS — Z20828 Contact with and (suspected) exposure to other viral communicable diseases: Secondary | ICD-10-CM | POA: Diagnosis not present

## 2020-08-11 DIAGNOSIS — E1021 Type 1 diabetes mellitus with diabetic nephropathy: Secondary | ICD-10-CM | POA: Diagnosis not present

## 2020-08-16 DIAGNOSIS — E10649 Type 1 diabetes mellitus with hypoglycemia without coma: Secondary | ICD-10-CM | POA: Diagnosis not present

## 2020-08-18 DIAGNOSIS — H353132 Nonexudative age-related macular degeneration, bilateral, intermediate dry stage: Secondary | ICD-10-CM | POA: Diagnosis not present

## 2020-08-18 DIAGNOSIS — E103593 Type 1 diabetes mellitus with proliferative diabetic retinopathy without macular edema, bilateral: Secondary | ICD-10-CM | POA: Diagnosis not present

## 2020-08-18 DIAGNOSIS — H0102B Squamous blepharitis left eye, upper and lower eyelids: Secondary | ICD-10-CM | POA: Diagnosis not present

## 2020-08-18 DIAGNOSIS — H0102A Squamous blepharitis right eye, upper and lower eyelids: Secondary | ICD-10-CM | POA: Diagnosis not present

## 2020-08-18 DIAGNOSIS — Z961 Presence of intraocular lens: Secondary | ICD-10-CM | POA: Diagnosis not present

## 2020-08-18 DIAGNOSIS — H04123 Dry eye syndrome of bilateral lacrimal glands: Secondary | ICD-10-CM | POA: Diagnosis not present

## 2020-08-24 DIAGNOSIS — Z20822 Contact with and (suspected) exposure to covid-19: Secondary | ICD-10-CM | POA: Diagnosis not present

## 2020-09-21 DIAGNOSIS — E1122 Type 2 diabetes mellitus with diabetic chronic kidney disease: Secondary | ICD-10-CM | POA: Diagnosis not present

## 2020-09-21 DIAGNOSIS — N184 Chronic kidney disease, stage 4 (severe): Secondary | ICD-10-CM | POA: Diagnosis not present

## 2020-09-21 DIAGNOSIS — I5031 Acute diastolic (congestive) heart failure: Secondary | ICD-10-CM | POA: Diagnosis not present

## 2020-09-21 DIAGNOSIS — I130 Hypertensive heart and chronic kidney disease with heart failure and stage 1 through stage 4 chronic kidney disease, or unspecified chronic kidney disease: Secondary | ICD-10-CM | POA: Diagnosis not present

## 2020-10-11 DIAGNOSIS — B351 Tinea unguium: Secondary | ICD-10-CM | POA: Diagnosis not present

## 2020-10-11 DIAGNOSIS — L84 Corns and callosities: Secondary | ICD-10-CM | POA: Diagnosis not present

## 2020-10-11 DIAGNOSIS — E1342 Other specified diabetes mellitus with diabetic polyneuropathy: Secondary | ICD-10-CM | POA: Diagnosis not present

## 2020-10-12 ENCOUNTER — Encounter (INDEPENDENT_AMBULATORY_CARE_PROVIDER_SITE_OTHER): Payer: Medicare Other | Admitting: Ophthalmology

## 2020-10-12 ENCOUNTER — Other Ambulatory Visit: Payer: Self-pay

## 2020-10-12 ENCOUNTER — Encounter (INDEPENDENT_AMBULATORY_CARE_PROVIDER_SITE_OTHER): Payer: Self-pay | Admitting: Ophthalmology

## 2020-10-12 ENCOUNTER — Ambulatory Visit (INDEPENDENT_AMBULATORY_CARE_PROVIDER_SITE_OTHER): Payer: Medicare Other | Admitting: Ophthalmology

## 2020-10-12 DIAGNOSIS — H353212 Exudative age-related macular degeneration, right eye, with inactive choroidal neovascularization: Secondary | ICD-10-CM | POA: Diagnosis not present

## 2020-10-12 DIAGNOSIS — H353133 Nonexudative age-related macular degeneration, bilateral, advanced atrophic without subfoveal involvement: Secondary | ICD-10-CM | POA: Diagnosis not present

## 2020-10-12 DIAGNOSIS — Z794 Long term (current) use of insulin: Secondary | ICD-10-CM | POA: Diagnosis not present

## 2020-10-12 DIAGNOSIS — E113553 Type 2 diabetes mellitus with stable proliferative diabetic retinopathy, bilateral: Secondary | ICD-10-CM

## 2020-10-12 NOTE — Assessment & Plan Note (Signed)
Quiescent PDR OU stable  The nature of regressed proliferative diabetic retinopathy was discussed with the patient. The patient was advised to maintain good glucose, blood pressure, monitor kidney function and serum lipid control as advised by personal physician. Rare risk for reactivation of progression exist with untreated severe anemia, untreated renal failure, untreated heart failure, and smoking. Complete avoidance of smoking was recommended. The chance of recurrent proliferative diabetic retinopathy was discussed as well as the chance of vitreous hemorrhage for which further treatments may be necessary.   Explained to the patient that the quiescent  proliferative diabetic retinopathy disease is unlikely to ever worsen.  Worsening factors would include however severe anemia, hypertension out-of-control or impending renal failure.

## 2020-10-12 NOTE — Assessment & Plan Note (Signed)
No signs of recurrence OD stable

## 2020-10-12 NOTE — Assessment & Plan Note (Signed)
Stable OU as well

## 2020-10-12 NOTE — Progress Notes (Signed)
10/12/2020     CHIEF COMPLAINT Patient presents for  Chief Complaint  Patient presents with   Retina Follow Up      HISTORY OF PRESENT ILLNESS: Kendra Walker is a 70 y.o. female who presents to the clinic today for:   HPI     Retina Follow Up   Patient presents with  Diabetic Retinopathy.  In both eyes.  This started 1 year ago.  Severity is mild.  Duration of 1 year.  Since onset it is stable.        Comments   42yr fu ou oct. Patient states vision is stable and unchanged since last visit. Denies any new floaters or FOL. LBS: 130 A1C: Pt states "it was probably about 7.8, about 3 months ago."      Last edited by Laurin Coder on 10/12/2020  1:43 PM.      Referring physician: Curlene Labrum, MD [place],  [place] [address]  HISTORICAL INFORMATION:   Selected notes from the [facility]: No current outpatient medications on file. (Ophthalmic Drugs)   No current facility-administered medications for this visit. (Ophthalmic Drugs)   Current Outpatient Medications (Other)  Medication Sig   acetaminophen (TYLENOL) 500 MG tablet Take 500 mg by mouth every 6 (six) hours as needed for mild pain or headache.    Ascorbic Acid (VITAMIN C WITH ROSE HIPS) 500 MG tablet Take 500 mg by mouth at bedtime.   aspirin EC 81 MG tablet Take 81 mg by mouth daily.   atorvastatin (LIPITOR) 80 MG tablet Take 1 tablet (80 mg total) by mouth daily. (Patient taking differently: Take 80 mg by mouth every evening.)   Calcium Carbonate (CALCIUM 600 PO) Take 600 mg by mouth in the morning and at bedtime.   Cholecalciferol (VITAMIN D3) 50 MCG (2000 UT) TABS Take 2,000 Units by mouth daily.   fludrocortisone (FLORINEF) 0.1 MG tablet Take 0.05 mg by mouth daily.   GLUCAGEN HYPOKIT 1 MG SOLR injection Inject into the muscle.   HUMALOG 100 UNIT/ML injection Inject into the skin See admin instructions. VIA INSULIN PUMP   Insulin Human (INSULIN  PUMP) SOLN Inject into the skin. HUMALOG 100 UNIT/ML   iron polysaccharides (NIFEREX) 150 MG capsule Take 150 mg by mouth at bedtime.    levothyroxine (SYNTHROID) 112 MCG tablet Take by mouth.   magnesium chloride (SLOW-MAG) 64 MG TBEC SR tablet Take 1-2 tablets by mouth See admin instructions. TAKE 1 TABLET IN THE MORNING & TAKE 2 TABLETS AT NIGHT.   Multiple Vitamin (MULTIVITAMIN WITH MINERALS) TABS tablet Take 1 tablet by mouth daily.   Multiple Vitamins-Minerals (PRESERVISION AREDS 2 PO) Take 1 tablet by mouth daily.    Omega-3 Fatty Acids (FISH OIL) 1200 MG CAPS Take 1,200 mg by mouth daily.   POLY-IRON 150 FORTE 150-25-1 MG-MCG-MG CAPS Take 1 capsule by mouth 2 (two) times daily.   predniSONE (DELTASONE) 5 MG tablet Take 5 mg by mouth daily with breakfast.   sodium bicarbonate 650 MG tablet Take 650 mg by mouth in the morning and at bedtime.   VELTASSA 8.4 g packet Take 8.4 g by mouth every Wednesday.    No current facility-administered medications for this visit. (Other)      REVIEW OF SYSTEMS:    ALLERGIES Allergies  Allergen Reactions   Bactrim [Sulfamethoxazole-Trimethoprim] Other (See Comments)    Elevates potassium.    Tape Other (See  Comments)    not allergic but causes sensitivity.   Fluorescein Rash   Procrit [Epoetin (Alfa)] Rash    PAST MEDICAL HISTORY Past Medical History:  Diagnosis Date   Cancer ([place])    Squamous Cell   Diabetes mellitus without complication ([place])    Hyperlipemia    Hypertension    Hypothyroidism    PAD (peripheral artery disease) ([place])    Past Surgical History:  Procedure Laterality Date   ABDOMINAL AORTOGRAM W/LOWER EXTREMITY N/A 06/09/2019   Procedure: ABDOMINAL AORTOGRAM W/LOWER EXTREMITY;  Surgeon: Serafina Mitchell, MD;  Location: [facility];  Service: Cardiovascular;  Laterality: N/A;   APPENDECTOMY     CATARACT EXTRACTION Bilateral    CORONARY ARTERY BYPASS GRAFT     PERIPHERAL VASCULAR INTERVENTION Bilateral  06/09/2019   Procedure: PERIPHERAL VASCULAR INTERVENTION;  Surgeon: Serafina Mitchell, MD;  Location: [facility];  Service: Cardiovascular;  Laterality: Bilateral;   TONSILLECTOMY     YAG LASER APPLICATION Right 6/44/0347   Procedure: YAG LASER APPLICATION;  Surgeon: Williams Che, MD;  Location: [facility];  Service: Ophthalmology;  Laterality: Right;    FAMILY HISTORY Family History  Problem Relation Age of Onset   Heart disease Mother    Hypertension Mother    Hypertension Father     SOCIAL HISTORY Social History   Tobacco Use   Smoking status: Never   Smokeless tobacco: Never  Vaping Use   Vaping Use: Never used  Substance Use Topics   Alcohol use: No    Alcohol/week: 0.0 standard drinks   Drug use: No         OPHTHALMIC EXAM:  Base Eye Exam     Visual Acuity (ETDRS)       Right Left   Dist cc 20/100 +1 20/40 -2   Dist ph cc NI     Correction: Glasses         Tonometry (Tonopen, 1:48 PM)       Right Left   Pressure 18 17         Pupils       Pupils Dark Light Shape React APD   Right PERRL 3 3  Minimal None   Left PERRL 4 4 Irregular Minimal None         Visual Fields (Counting fingers)       Left Right   Restrictions Partial outer inferior temporal, superior nasal, inferior nasal deficiencies Total superior temporal, superior nasal, inferior nasal deficiencies; Partial outer inferior temporal deficiency         Extraocular Movement       Right Left    Full Full         Neuro/Psych     Oriented x3: Yes   Mood/Affect: Normal         Dilation     Both eyes: 1.0% Mydriacyl, 2.5% Phenylephrine @ 1:48 PM           Slit Lamp and Fundus Exam     External Exam       Right Left   External Normal Normal         Slit Lamp Exam       Right Left   Lids/Lashes Normal Normal   Conjunctiva/Sclera White and quiet White and quiet   Cornea Clear Clear   Anterior Chamber Deep and quiet Deep and quiet   Iris Round  and reactive Round and reactive   Lens Posterior chamber intraocular lens Posterior chamber intraocular  lens   Anterior Vitreous Normal Normal         Fundus Exam       Right Left   Posterior Vitreous Posterior vitreous detachment Posterior vitreous detachment   Disc Normal Normal   C/D Ratio 0.1 0.3   Macula Disciform scar, and scarring in the FAZ Disciform scar, non foveal, not active   Vessels PDR-quiet PDR-quiet   Periphery Good PRP,  attached 360 Good PRP,  attached 360            IMAGING AND PROCEDURES  Imaging and Procedures for 10/12/20  OCT, Retina - OU - Both Eyes       Right Eye Quality was good. Scan locations included subfoveal. Central Foveal Thickness: 181. Findings include central retinal atrophy, outer retinal atrophy, inner retinal atrophy, abnormal foveal contour, disciform scar.   Left Eye Quality was good. Scan locations included subfoveal. Central Foveal Thickness: 307. Findings include abnormal foveal contour, disciform scar.   Notes Diffuse retinal atrophy OD, stable over time with subfoveal scarring  OS, stable with old disciform scar temporally.  No active maculopathy.             ASSESSMENT/PLAN:  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 choroidal neovascularization (HCC) No signs of recurrence OD stable  Controlled type 2 diabetes mellitus with stable proliferative retinopathy of both eyes, with long-term current use of insulin ([facility]) Quiescent PDR OU stable  The nature of regressed proliferative diabetic retinopathy was discussed with the patient. The patient was advised to maintain good glucose, blood pressure, monitor kidney function and serum lipid control as advised by personal physician. Rare risk for reactivation of progression exist with untreated severe anemia, untreated renal failure, untreated heart failure, and smoking. Complete avoidance of smoking was recommended. The chance of recurrent  proliferative diabetic retinopathy was discussed as well as the chance of vitreous hemorrhage for which further treatments may be necessary.   Explained to the patient that the quiescent  proliferative diabetic retinopathy disease is unlikely to ever worsen.  Worsening factors would include however severe anemia, hypertension out-of-control or impending renal failure.  Advanced nonexudative age-related macular degeneration of both eyes without subfoveal involvement Stable OU as well     ICD-10-CM   1. Controlled type 2 diabetes mellitus with stable proliferative retinopathy of both eyes, with long-term current use of insulin (HCC)  E11.3553 OCT, Retina - OU - Both Eyes   Z79.4     2. 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 choroidal neovascularization ([place])  H35.3212     3. 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  OU with quiescent PDR.  2.  History of disciform scar macula OD stable no recurrence  3.  Dry ARMD OU stable  Ophthalmic Meds Ordered this visit:  No orders of the defined types were placed in this encounter.      Return in about 9 months (around 07/12/2021) for DILATE OU, COLOR FP, OCT.  There are no Patient Instructions on file for this visit.   Explained the diagnoses, plan, and follow up with the patient and they expressed understanding.  Patient expressed understanding of the importance of proper follow up care.   Clent Demark Geoffery Aultman M.D. Diseases & Surgery of the Retina and Vitreous Retina & Diabetic [place] 10/12/20     Abbreviations: M myopia (nearsighted); A astigmatism; H hyperopia (farsighted); P presbyopia; Mrx spectacle prescription;  CTL contact lenses; OD right eye; OS left eye; OU both eyes  XT exotropia; ET esotropia; PEK punctate epithelial keratitis; PEE punctate epithelial erosions; DES dry eye syndrome; MGD meibomian gland dysfunction; ATs  artificial tears; PFAT's preservative free artificial tears; [place] nuclear sclerotic cataract; PSC posterior subcapsular cataract; ERM epi-retinal membrane; PVD posterior vitreous detachment; RD retinal detachment; DM diabetes mellitus; DR diabetic retinopathy; NPDR non-proliferative diabetic retinopathy; PDR proliferative diabetic retinopathy; CSME clinically significant macular edema; DME diabetic macular edema; dbh dot blot hemorrhages; CWS cotton wool spot; POAG primary open angle glaucoma; C/D cup-to-disc ratio; HVF humphrey visual field; GVF goldmann visual field; OCT optical coherence tomography; IOP intraocular pressure; BRVO Branch retinal vein occlusion; CRVO central retinal vein occlusion; CRAO central retinal artery occlusion; BRAO branch retinal artery occlusion; RT retinal tear; SB scleral buckle; PPV pars plana vitrectomy; VH Vitreous hemorrhage; PRP panretinal laser photocoagulation; IVK intravitreal kenalog; VMT vitreomacular traction; MH Macular hole;  NVD neovascularization of the disc; NVE neovascularization elsewhere; AREDS age related eye disease study; ARMD age related macular degeneration; POAG primary open angle glaucoma; EBMD epithelial/anterior basement membrane dystrophy; ACIOL anterior chamber intraocular lens; IOL intraocular lens; PCIOL posterior chamber intraocular lens; Phaco/IOL phacoemulsification with intraocular lens placement; [place] photorefractive keratectomy; LASIK laser assisted in situ keratomileusis; HTN hypertension; DM diabetes mellitus; COPD chronic obstructive pulmonary disease

## 2020-10-14 ENCOUNTER — Emergency Department (HOSPITAL_COMMUNITY): Payer: Medicare Other

## 2020-10-14 ENCOUNTER — Other Ambulatory Visit: Payer: Self-pay

## 2020-10-14 ENCOUNTER — Encounter (HOSPITAL_COMMUNITY): Payer: Self-pay

## 2020-10-14 ENCOUNTER — Inpatient Hospital Stay (HOSPITAL_COMMUNITY)
Admission: EM | Admit: 2020-10-14 | Discharge: 2020-10-17 | DRG: 041 | Disposition: A | Discharge status: Home / Self Care | Payer: Medicare Other | Attending: Internal Medicine | Admitting: Internal Medicine

## 2020-10-14 DIAGNOSIS — E1051 Type 1 diabetes mellitus with diabetic peripheral angiopathy without gangrene: Secondary | ICD-10-CM | POA: Diagnosis present

## 2020-10-14 DIAGNOSIS — I739 Peripheral vascular disease, unspecified: Secondary | ICD-10-CM | POA: Diagnosis present

## 2020-10-14 DIAGNOSIS — H5462 Unqualified visual loss, left eye, normal vision right eye: Secondary | ICD-10-CM | POA: Diagnosis present

## 2020-10-14 DIAGNOSIS — Z951 Presence of aortocoronary bypass graft: Secondary | ICD-10-CM

## 2020-10-14 DIAGNOSIS — G8321 Monoplegia of upper limb affecting right dominant side: Secondary | ICD-10-CM | POA: Diagnosis not present

## 2020-10-14 DIAGNOSIS — E063 Autoimmune thyroiditis: Secondary | ICD-10-CM | POA: Diagnosis present

## 2020-10-14 DIAGNOSIS — Z23 Encounter for immunization: Secondary | ICD-10-CM

## 2020-10-14 DIAGNOSIS — H538 Other visual disturbances: Secondary | ICD-10-CM | POA: Diagnosis present

## 2020-10-14 DIAGNOSIS — N1832 Chronic kidney disease, stage 3b: Secondary | ICD-10-CM | POA: Diagnosis present

## 2020-10-14 DIAGNOSIS — E039 Hypothyroidism, unspecified: Secondary | ICD-10-CM | POA: Diagnosis present

## 2020-10-14 DIAGNOSIS — E1022 Type 1 diabetes mellitus with diabetic chronic kidney disease: Secondary | ICD-10-CM | POA: Diagnosis present

## 2020-10-14 DIAGNOSIS — Z91041 Radiographic dye allergy status: Secondary | ICD-10-CM

## 2020-10-14 DIAGNOSIS — E872 Acidosis: Secondary | ICD-10-CM | POA: Diagnosis present

## 2020-10-14 DIAGNOSIS — N183 Chronic kidney disease, stage 3 unspecified: Secondary | ICD-10-CM | POA: Diagnosis present

## 2020-10-14 DIAGNOSIS — I130 Hypertensive heart and chronic kidney disease with heart failure and stage 1 through stage 4 chronic kidney disease, or unspecified chronic kidney disease: Secondary | ICD-10-CM | POA: Diagnosis present

## 2020-10-14 DIAGNOSIS — I5032 Chronic diastolic (congestive) heart failure: Secondary | ICD-10-CM | POA: Diagnosis present

## 2020-10-14 DIAGNOSIS — I10 Essential (primary) hypertension: Secondary | ICD-10-CM | POA: Diagnosis present

## 2020-10-14 DIAGNOSIS — I6523 Occlusion and stenosis of bilateral carotid arteries: Secondary | ICD-10-CM | POA: Diagnosis present

## 2020-10-14 DIAGNOSIS — E875 Hyperkalemia: Secondary | ICD-10-CM | POA: Diagnosis present

## 2020-10-14 DIAGNOSIS — Z85828 Personal history of other malignant neoplasm of skin: Secondary | ICD-10-CM

## 2020-10-14 DIAGNOSIS — G9389 Other specified disorders of brain: Secondary | ICD-10-CM | POA: Diagnosis not present

## 2020-10-14 DIAGNOSIS — I639 Cerebral infarction, unspecified: Secondary | ICD-10-CM

## 2020-10-14 DIAGNOSIS — R4781 Slurred speech: Secondary | ICD-10-CM | POA: Diagnosis present

## 2020-10-14 DIAGNOSIS — I2510 Atherosclerotic heart disease of native coronary artery without angina pectoris: Secondary | ICD-10-CM | POA: Diagnosis present

## 2020-10-14 DIAGNOSIS — R531 Weakness: Secondary | ICD-10-CM | POA: Diagnosis not present

## 2020-10-14 DIAGNOSIS — R200 Anesthesia of skin: Secondary | ICD-10-CM | POA: Diagnosis not present

## 2020-10-14 DIAGNOSIS — R29700 NIHSS score 0: Secondary | ICD-10-CM | POA: Diagnosis present

## 2020-10-14 DIAGNOSIS — I6389 Other cerebral infarction: Secondary | ICD-10-CM | POA: Diagnosis not present

## 2020-10-14 DIAGNOSIS — Z9109 Other allergy status, other than to drugs and biological substances: Secondary | ICD-10-CM

## 2020-10-14 DIAGNOSIS — Z7952 Long term (current) use of systemic steroids: Secondary | ICD-10-CM

## 2020-10-14 DIAGNOSIS — E103553 Type 1 diabetes mellitus with stable proliferative diabetic retinopathy, bilateral: Secondary | ICD-10-CM | POA: Diagnosis present

## 2020-10-14 DIAGNOSIS — Z881 Allergy status to other antibiotic agents status: Secondary | ICD-10-CM

## 2020-10-14 DIAGNOSIS — I63412 Cerebral infarction due to embolism of left middle cerebral artery: Secondary | ICD-10-CM | POA: Diagnosis not present

## 2020-10-14 DIAGNOSIS — Z794 Long term (current) use of insulin: Secondary | ICD-10-CM

## 2020-10-14 DIAGNOSIS — Z6822 Body mass index (BMI) 22.0-22.9, adult: Secondary | ICD-10-CM | POA: Diagnosis not present

## 2020-10-14 DIAGNOSIS — Z9641 Presence of insulin pump (external) (internal): Secondary | ICD-10-CM | POA: Diagnosis present

## 2020-10-14 DIAGNOSIS — Z7982 Long term (current) use of aspirin: Secondary | ICD-10-CM

## 2020-10-14 DIAGNOSIS — I480 Paroxysmal atrial fibrillation: Secondary | ICD-10-CM | POA: Diagnosis present

## 2020-10-14 DIAGNOSIS — Z8673 Personal history of transient ischemic attack (TIA), and cerebral infarction without residual deficits: Secondary | ICD-10-CM | POA: Diagnosis not present

## 2020-10-14 DIAGNOSIS — E271 Primary adrenocortical insufficiency: Secondary | ICD-10-CM | POA: Diagnosis present

## 2020-10-14 DIAGNOSIS — Z79899 Other long term (current) drug therapy: Secondary | ICD-10-CM

## 2020-10-14 DIAGNOSIS — E10649 Type 1 diabetes mellitus with hypoglycemia without coma: Secondary | ICD-10-CM | POA: Diagnosis not present

## 2020-10-14 DIAGNOSIS — G319 Degenerative disease of nervous system, unspecified: Secondary | ICD-10-CM | POA: Diagnosis not present

## 2020-10-14 DIAGNOSIS — G459 Transient cerebral ischemic attack, unspecified: Secondary | ICD-10-CM | POA: Diagnosis not present

## 2020-10-14 DIAGNOSIS — E785 Hyperlipidemia, unspecified: Secondary | ICD-10-CM | POA: Diagnosis present

## 2020-10-14 DIAGNOSIS — Z20822 Contact with and (suspected) exposure to covid-19: Secondary | ICD-10-CM | POA: Diagnosis present

## 2020-10-14 DIAGNOSIS — Z8249 Family history of ischemic heart disease and other diseases of the circulatory system: Secondary | ICD-10-CM

## 2020-10-14 DIAGNOSIS — Z888 Allergy status to other drugs, medicaments and biological substances status: Secondary | ICD-10-CM

## 2020-10-14 DIAGNOSIS — Z79890 Hormone replacement therapy (postmenopausal): Secondary | ICD-10-CM

## 2020-10-14 LAB — COMPREHENSIVE METABOLIC PANEL
ALT: 23 U/L (ref 0–44)
AST: 28 U/L (ref 15–41)
Albumin: 3.8 g/dL (ref 3.5–5.0)
Alkaline Phosphatase: 55 U/L (ref 38–126)
Anion gap: 7 (ref 5–15)
BUN: 45 mg/dL — ABNORMAL HIGH (ref 8–23)
CO2: 30 mmol/L (ref 22–32)
Calcium: 9.5 mg/dL (ref 8.9–10.3)
Chloride: 100 mmol/L (ref 98–111)
Creatinine, Ser: 1.59 mg/dL — ABNORMAL HIGH (ref 0.44–1.00)
GFR, Estimated: 35 mL/min — ABNORMAL LOW (ref >60)
Glucose, Bld: 194 mg/dL — ABNORMAL HIGH (ref 70–99)
Potassium: 5.4 mmol/L — ABNORMAL HIGH (ref 3.5–5.1)
Sodium: 137 mmol/L (ref 135–145)
Total Bilirubin: 0.8 mg/dL (ref 0.3–1.2)
Total Protein: 6.5 g/dL (ref 6.5–8.1)

## 2020-10-14 LAB — CBC
HCT: 43 % (ref 36.0–46.0)
Hemoglobin: 14.1 g/dL (ref 12.0–15.0)
MCH: 31.3 pg (ref 26.0–34.0)
MCHC: 32.8 g/dL (ref 30.0–36.0)
MCV: 95.3 fL (ref 80.0–100.0)
Platelets: 278 10*3/uL (ref 150–400)
RBC: 4.51 MIL/uL (ref 3.87–5.11)
RDW: 14.1 % (ref 11.5–15.5)
WBC: 9.6 10*3/uL (ref 4.0–10.5)
nRBC: 0 % (ref 0.0–0.2)

## 2020-10-14 LAB — RAPID URINE DRUG SCREEN, HOSP PERFORMED
Amphetamines: NOT DETECTED
Barbiturates: NOT DETECTED
Benzodiazepines: NOT DETECTED
Cocaine: NOT DETECTED
Opiates: NOT DETECTED
Tetrahydrocannabinol: NOT DETECTED

## 2020-10-14 LAB — CBG MONITORING, ED
Glucose-Capillary: 187 mg/dL — ABNORMAL HIGH (ref 70–99)
Glucose-Capillary: 99 mg/dL (ref 70–99)

## 2020-10-14 LAB — URINALYSIS, ROUTINE W REFLEX MICROSCOPIC
Bilirubin Urine: NEGATIVE
Glucose, UA: NEGATIVE mg/dL
Hgb urine dipstick: NEGATIVE
Ketones, ur: 5 mg/dL — AB
Nitrite: NEGATIVE
Protein, ur: NEGATIVE mg/dL
Specific Gravity, Urine: 1.012 (ref 1.005–1.030)
WBC, UA: >50 WBC/hpf — ABNORMAL HIGH (ref 0–5)
pH: 6 (ref 5.0–8.0)

## 2020-10-14 LAB — DIFFERENTIAL
Abs Immature Granulocytes: 0.03 10*3/uL (ref 0.00–0.07)
Basophils Absolute: 0.1 10*3/uL (ref 0.0–0.1)
Basophils Relative: 1 %
Eosinophils Absolute: 0.5 10*3/uL (ref 0.0–0.5)
Eosinophils Relative: 6 %
Immature Granulocytes: 0 %
Lymphocytes Relative: 16 %
Lymphs Abs: 1.6 10*3/uL (ref 0.7–4.0)
Monocytes Absolute: 0.9 10*3/uL (ref 0.1–1.0)
Monocytes Relative: 9 %
Neutro Abs: 6.5 10*3/uL (ref 1.7–7.7)
Neutrophils Relative %: 68 %

## 2020-10-14 LAB — I-STAT CHEM 8, ED
BUN: 71 mg/dL — ABNORMAL HIGH (ref 8–23)
Calcium, Ion: 1.08 mmol/L — ABNORMAL LOW (ref 1.15–1.40)
Chloride: 100 mmol/L (ref 98–111)
Creatinine, Ser: 1.8 mg/dL — ABNORMAL HIGH (ref 0.44–1.00)
Glucose, Bld: 170 mg/dL — ABNORMAL HIGH (ref 70–99)
HCT: 45 % (ref 36.0–46.0)
Hemoglobin: 15.3 g/dL — ABNORMAL HIGH (ref 12.0–15.0)
Potassium: 8.2 mmol/L (ref 3.5–5.1)
Sodium: 135 mmol/L (ref 135–145)
TCO2: 36 mmol/L — ABNORMAL HIGH (ref 22–32)

## 2020-10-14 LAB — RESP PANEL BY RT-PCR (FLU A&B, COVID) ARPGX2
Influenza A by PCR: NEGATIVE
Influenza B by PCR: NEGATIVE
SARS Coronavirus 2 by RT PCR: NEGATIVE

## 2020-10-14 LAB — PROTIME-INR
INR: 0.9 (ref 0.8–1.2)
Prothrombin Time: 12.4 seconds (ref 11.4–15.2)

## 2020-10-14 LAB — GLUCOSE, CAPILLARY: Glucose-Capillary: 98 mg/dL (ref 70–99)

## 2020-10-14 LAB — ETHANOL: Alcohol, Ethyl (B): <10 mg/dL (ref <10)

## 2020-10-14 LAB — APTT: aPTT: 26 seconds (ref 24–36)

## 2020-10-14 IMAGING — CT CT HEAD W/O CM
3 series · 16 of 47 positions shown, 19 images · non-contrast
Comparison: None.

CLINICAL DATA: Slurred speech, right arm numbness

EXAM:
CT HEAD WITHOUT CONTRAST
TECHNIQUE: Contiguous axial images were obtained from the base of the skull
through the vertex without intravenous contrast.

[Series 2: head w o · axial · 0.44mm/px · z∈[+40,+165]mm · 10 of 31 slices shown, 13 images]
[im 3/31  brain]
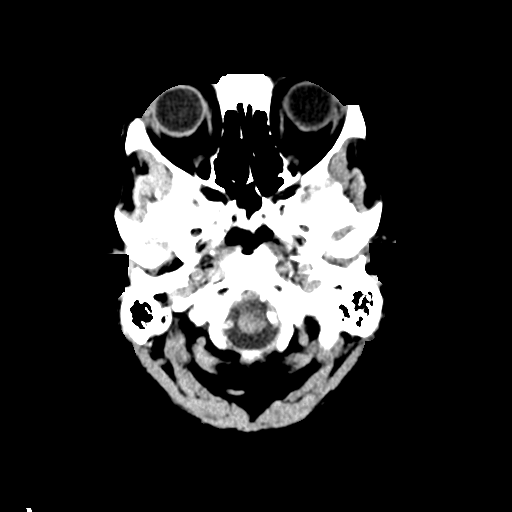
[im 3/31  bone]
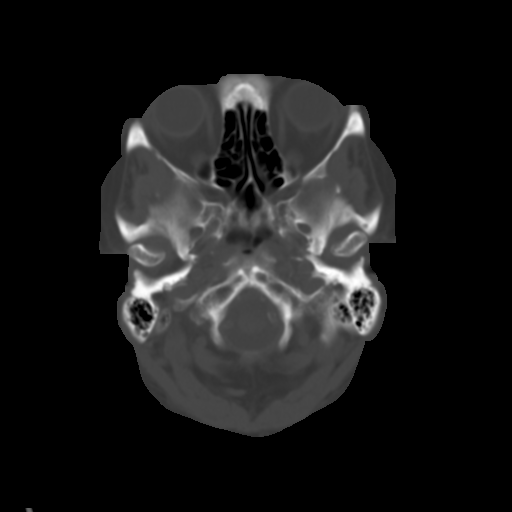
[im 6/31  brain]
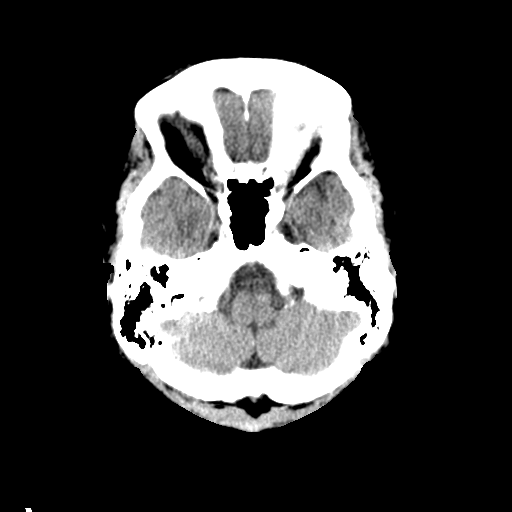
[im 9/31  brain]
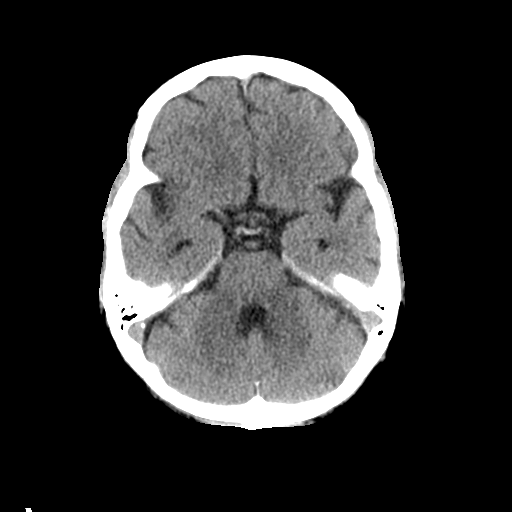
[im 11/31  brain]
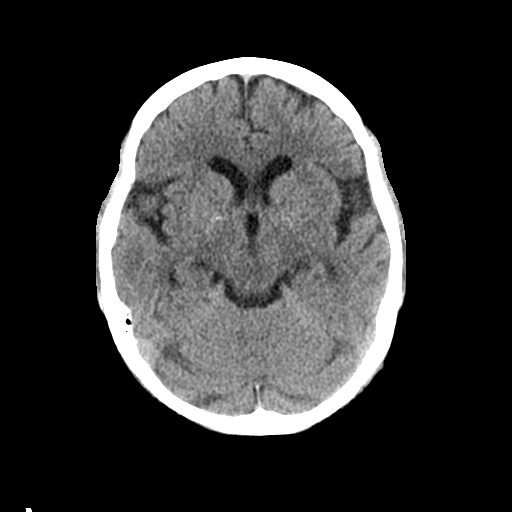
[im 14/31  brain]
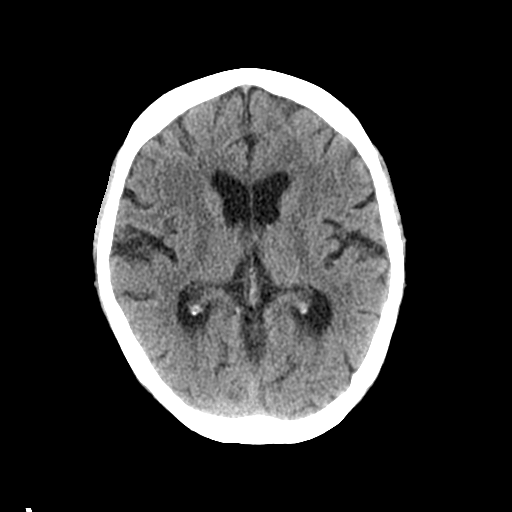
[im 14/31  bone]
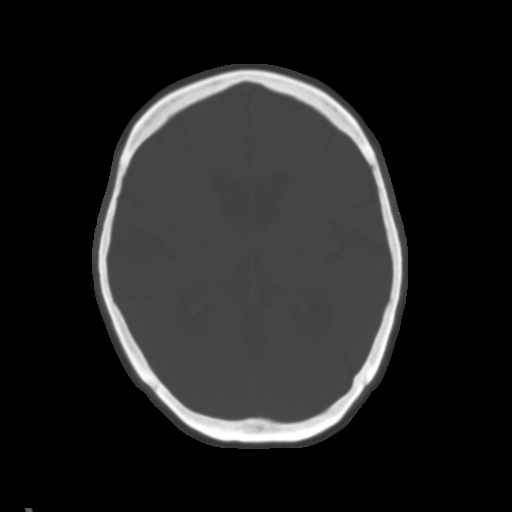
[im 17/31  brain]
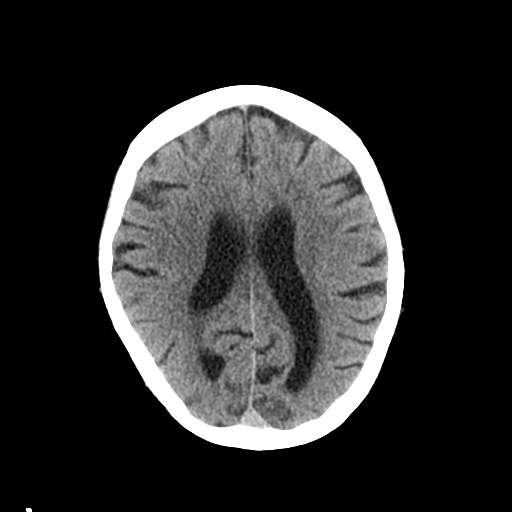
[im 20/31  brain]
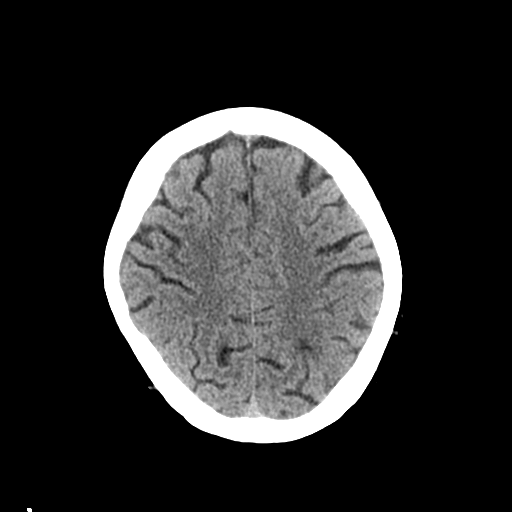
[im 23/31  brain]
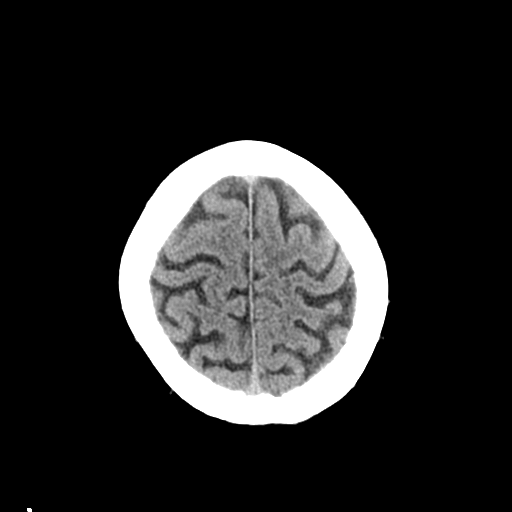
[im 25/31  brain]
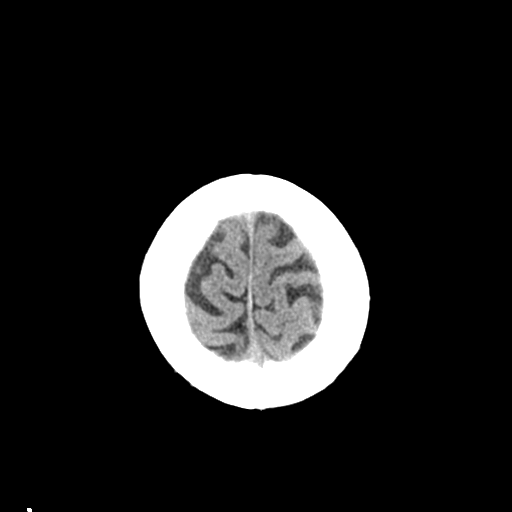
[im 25/31  bone]
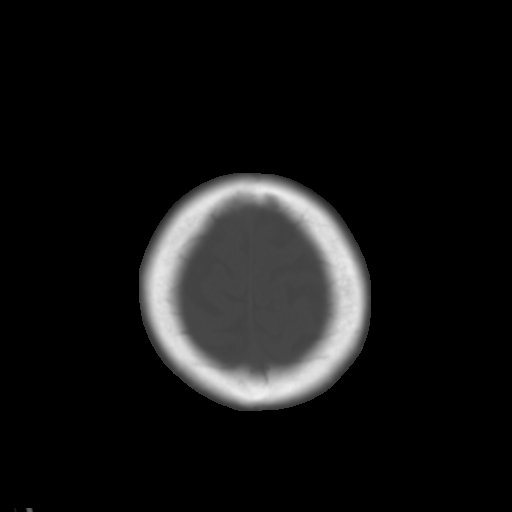
[im 28/31  brain]
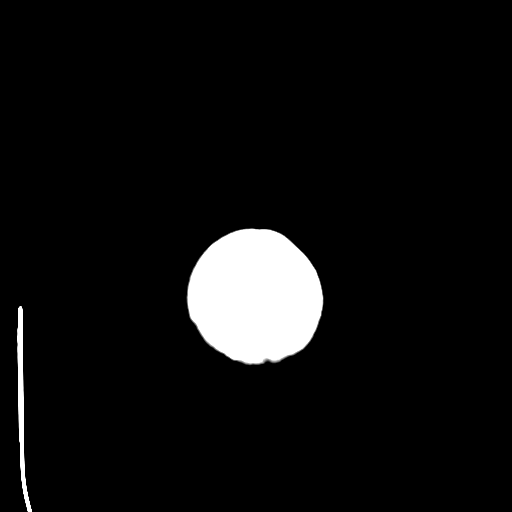

[Series 4: coronal soft · coronal · 0.33mm/px · 3 of 66 slices shown]
[im 22/66  brain]
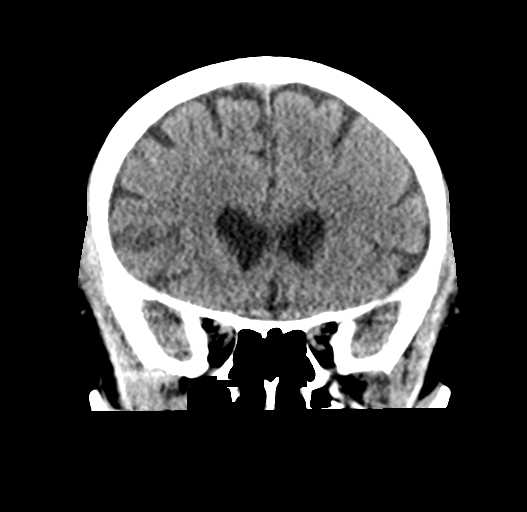
[im 29/66  brain]
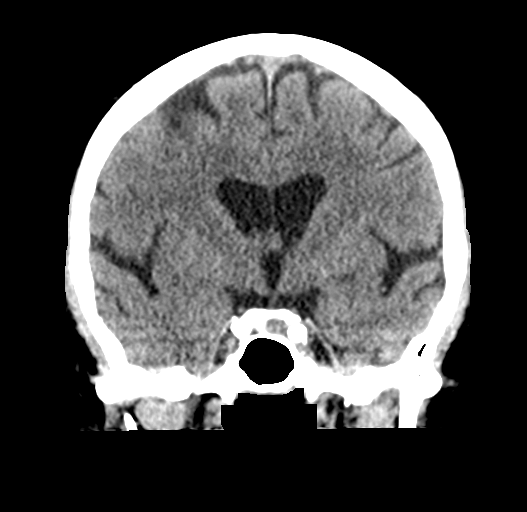
[im 37/66  brain]
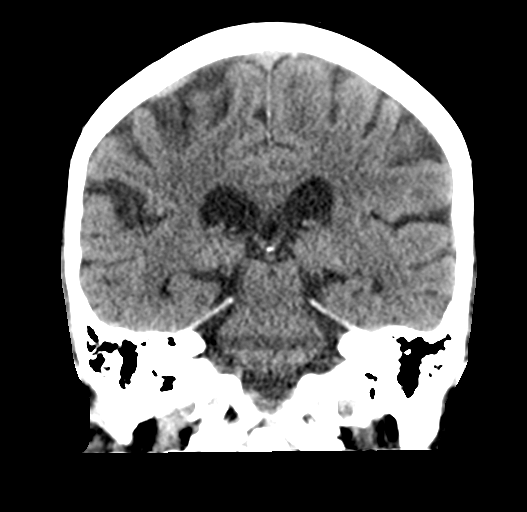

[Series 5: sagittal soft · sagittal · 0.33mm/px · 3 of 59 slices shown]
[im 20/59  brain]
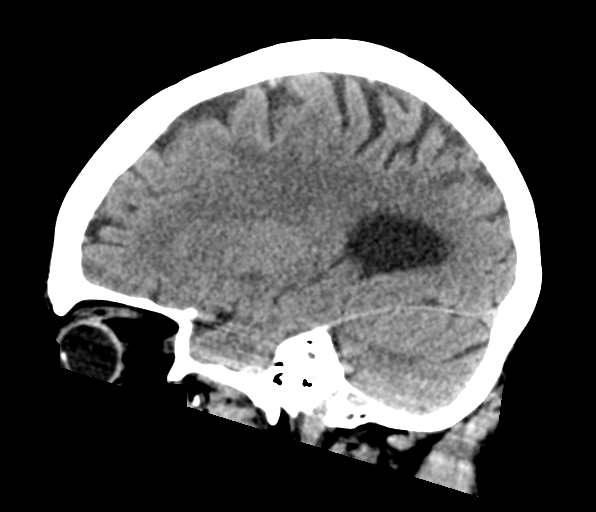
[im 30/59  brain]
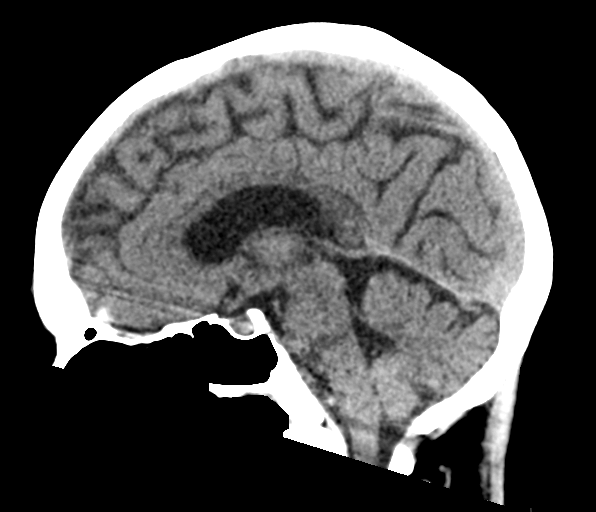
[im 39/59  brain]
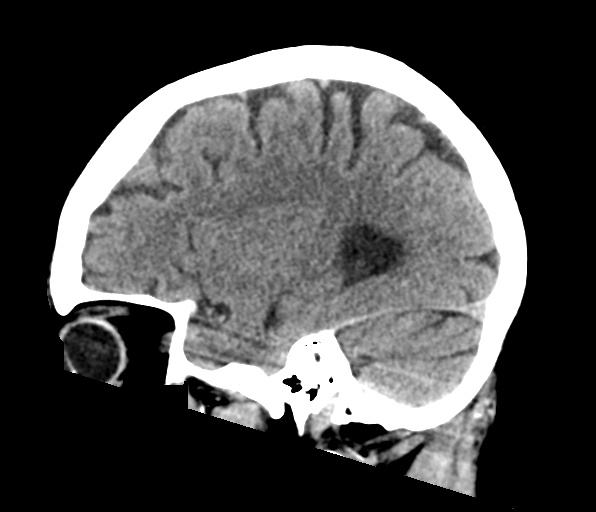

[16 of 47 positions shown; findings below may reference images not displayed]

FINDINGS: Brain: There is no evidence of acute intracranial hemorrhage,
extra-axial fluid collection, or acute infarct.

The ventricles are normal in size. There is no mass lesion. There is
no midline shift. There is no significant burden of chronic white
matter microangiopathy.

Vascular: There is dense calcification of the bilateral cavernous
ICAs.

Skull: Normal. Negative for fracture or focal lesion.

Sinuses/Orbits: The imaged paranasal sinuses are clear. Bilateral
lens implants are in place. The globes and orbits are otherwise
unremarkable.

Other: There is a 1.3 cm partially calcified nodule in the right
frontoparietal scalp.
IMPRESSION: 1. No acute intracranial pathology.
2. 1.3 cm partially calcified in the right frontoparietal scalp is
indeterminate. Correlate with physical exam.

## 2020-10-14 IMAGING — MR MR HEAD W/O CM
11 of 12 series · 42 of 48 positions shown · non-contrast
Comparison: Head CT [DATE].

CLINICAL DATA: Neuro deficit, acute, stroke suspected. Additional
history provided: Patient developed slurred speech lasting
approximately 40 minutes, right arm numbness, right arm weakness,
all now resolved except for persistent right arm weakness.

EXAM:
MRI HEAD WITHOUT CONTRAST
TECHNIQUE: Multiplanar, multiecho pulse sequences of the brain and surrounding
structures were obtained without intravenous contrast.

[Series 5: DWI · axial · 4.0mm · 0.88mm/px · z∈[-83,+49]mm · 5 of 36 slices shown (1 of 4)]
[im 1/36]
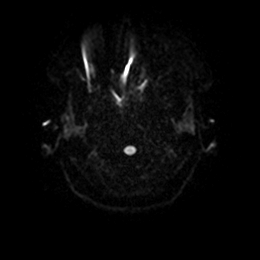
[im 9/36]
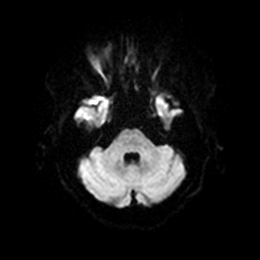
[im 18/36]
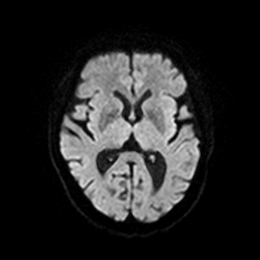
[im 27/36]
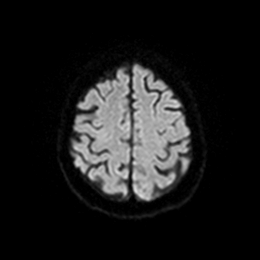
[im 36/36]
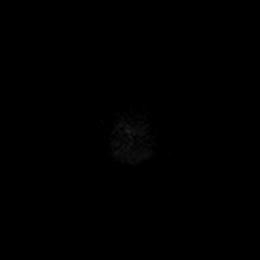

[Series 6: DWI · axial · 4.0mm · 0.88mm/px · z∈[-83,+49]mm · 5 of 36 slices shown (2 of 4)]
[im 1/36]
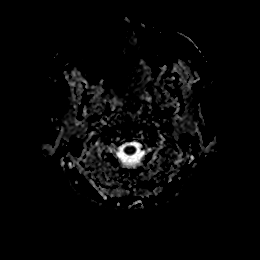
[im 9/36]
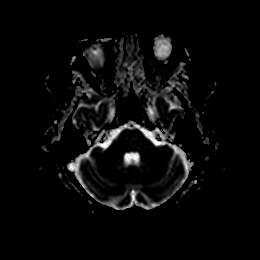
[im 18/36]
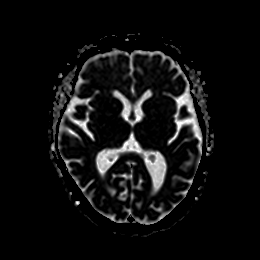
[im 27/36]
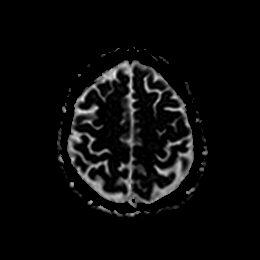
[im 36/36]
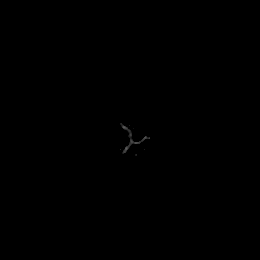

[Series 7: DWI · coronal · 4.0mm · 0.88mm/px · 3 of 32 slices shown (3 of 4)]
[im 1/32]
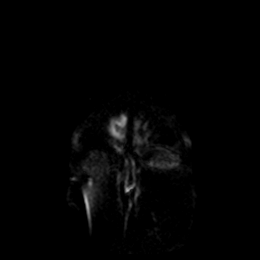
[im 16/32]
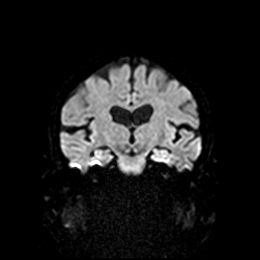
[im 32/32]
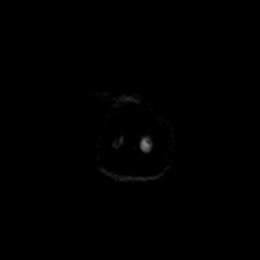

[Series 8: DWI · coronal · 4.0mm · 0.88mm/px · 3 of 32 slices shown (4 of 4)]
[im 1/32]
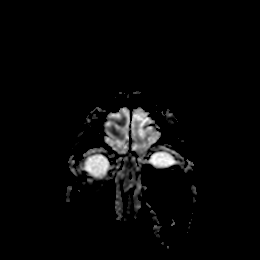
[im 16/32]
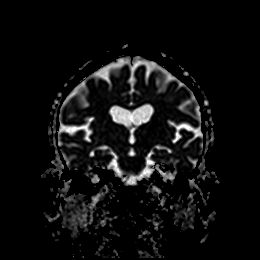
[im 32/32]
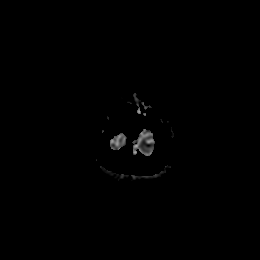

[Series 14: T1 · sagittal · 5.0mm · 0.80mm/px · 2 of 21 slices shown]
[im 1/21]
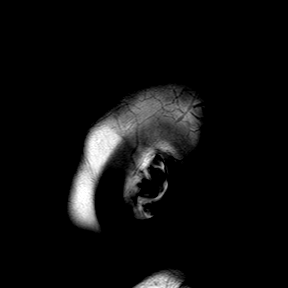
[im 21/21]
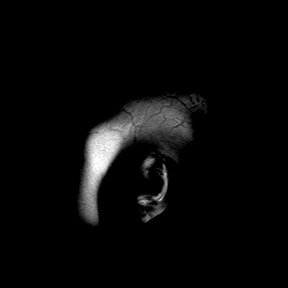

[Series 15: T2 · axial · 5.0mm · 0.72mm/px · z∈[-90,+56]mm · 2 of 23 slices shown (1 of 2)]
[im 1/23]
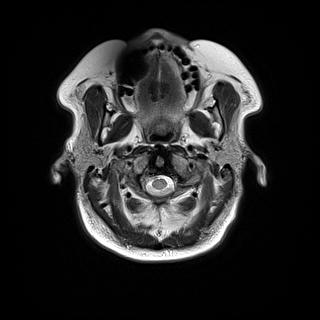
[im 23/23]
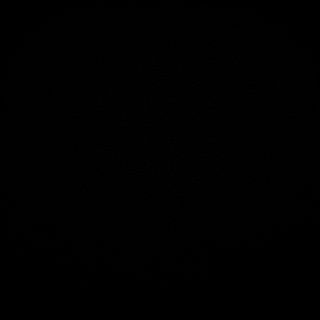

[Series 16: mag_images · axial · 3.0mm · 0.90mm/px · z∈[-86,+58]mm · 5 of 52 slices shown]
[im 1/52]
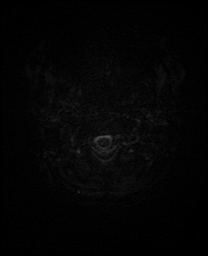
[im 13/52]
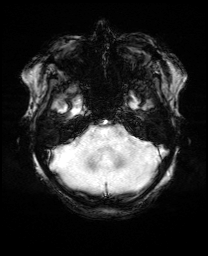
[im 26/52]
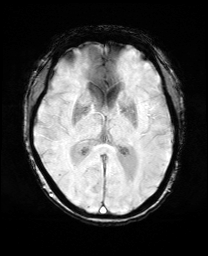
[im 39/52]
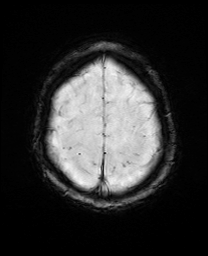
[im 52/52]
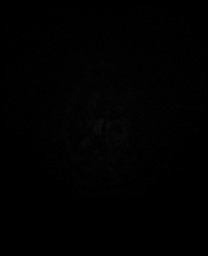

[Series 17: pha_images · axial · 3.0mm · 0.90mm/px · z∈[-86,+53]mm · 5 of 50 slices shown]
[im 1/50]
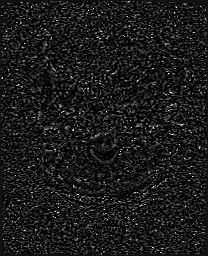
[im 13/50]
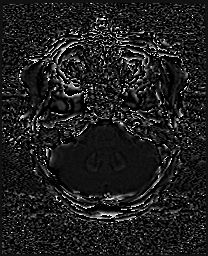
[im 25/50]
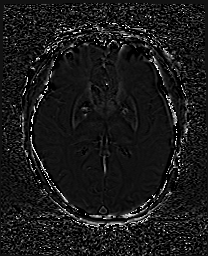
[im 37/50]
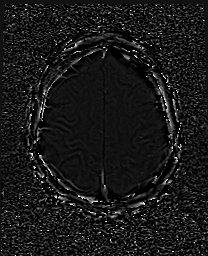
[im 50/50]
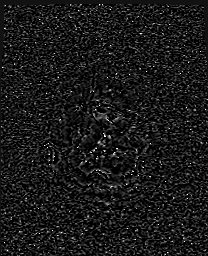

[Series 18: swi_images · axial · 3.0mm · 0.90mm/px · z∈[-86,+58]mm · 5 of 52 slices shown]
[im 1/52]
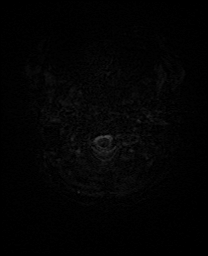
[im 13/52]
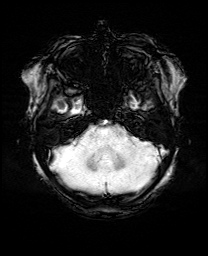
[im 26/52]
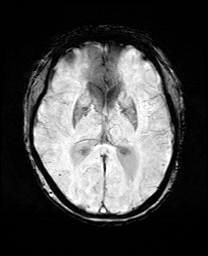
[im 39/52]
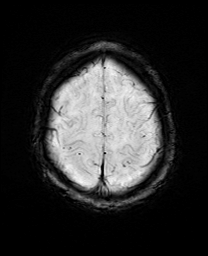
[im 52/52]
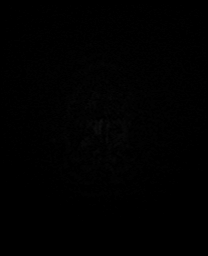

[Series 20: FLAIR · axial · 4.0mm · 0.43mm/px · z∈[-84,+56]mm · 4 of 38 slices shown]
[im 1/38]
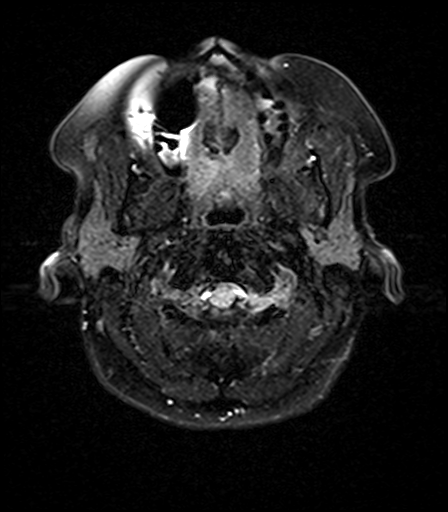
[im 13/38]
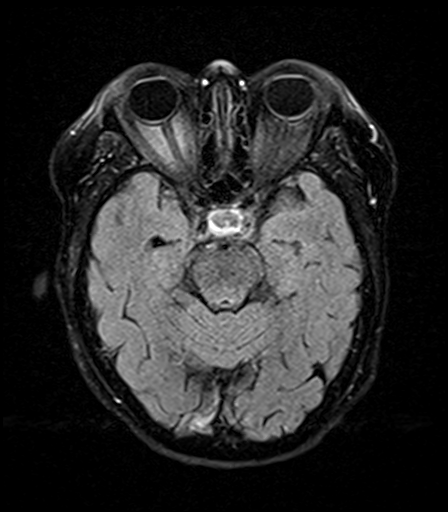
[im 25/38]
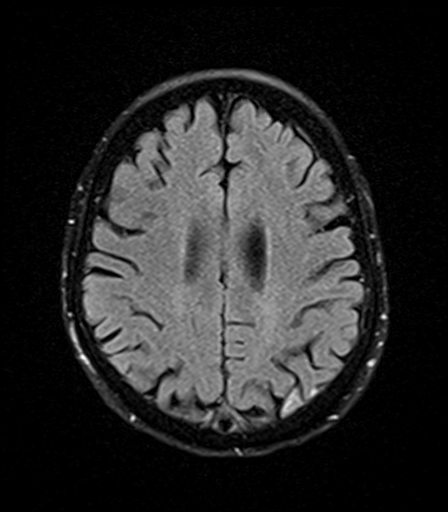
[im 38/38]
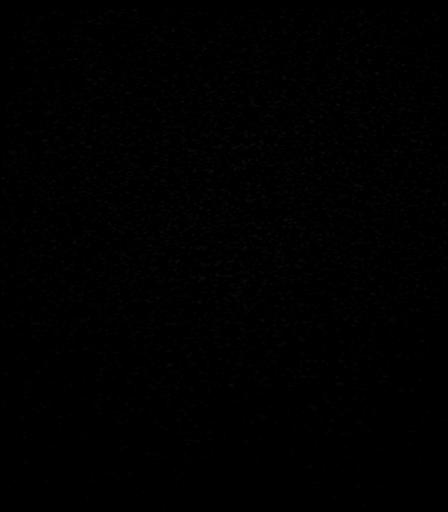

[Series 22: T2 · coronal · 5.0mm · 0.72mm/px · 3 of 28 slices shown (2 of 2)]
[im 1/28]
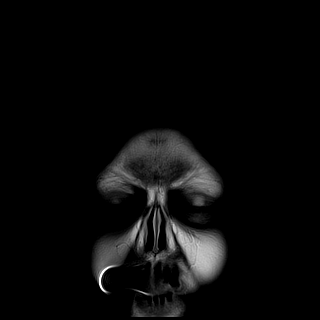
[im 14/28]
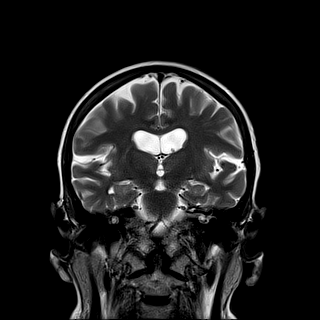
[im 28/28]
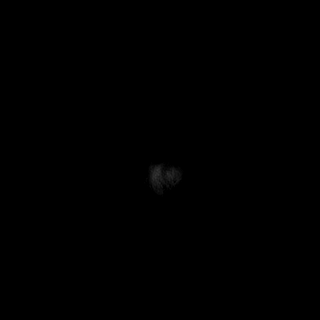

[42 of 48 positions shown; findings below may reference images not displayed]

FINDINGS: Brain:

Mild generalized cerebral atrophy.

Punctate acute cortical infarct within the posterior left frontal
lobe (motor strip) (series 5, image 28) (series 7, image 16).

Small chronic cortical infarcts within the left parietooccipital
lobes with associated chronic hemosiderin deposition at this site.

There are a few additional scattered supratentorial chronic
microhemorrhages

No evidence of an intracranial mass.

No extra-axial fluid collection.

No midline shift.

Vascular: Maintained flow voids within the proximal large arterial
vessels.

Skull and upper cervical spine: Focal suspicious marrow lesion.

Sinuses/Orbits: Visualized orbits show no acute finding. Bilateral
lens replacements.

Other: Trace fluid within the left mastoid air cells. Redemonstrated
partially calcified right parietal scalp lesion, nonspecific but
likely reflecting a cyst.
IMPRESSION: Punctate acute cortical infarct within the posterior left frontal
lobe (motor strip).

Small chronic cortical infarcts within the left parietooccipital
lobes with associated chronic hemosiderin deposition at this site.

Mild generalized cerebral atrophy.

Trace fluid within the left mastoid air cells.

## 2020-10-14 MED ORDER — SENNOSIDES-DOCUSATE SODIUM 8.6-50 MG PO TABS: 1.0000 | ORAL_TABLET | Freq: Every evening | ORAL | Status: DC | PRN

## 2020-10-14 MED ORDER — ENOXAPARIN SODIUM 30 MG/0.3ML IJ SOSY
30.0000 mg | PREFILLED_SYRINGE | INTRAMUSCULAR | Status: DC
  Administered 2020-10-15 – 2020-10-17 (×3): 30 mg via SUBCUTANEOUS
  Filled 2020-10-14 (×3): qty 0.3

## 2020-10-14 MED ORDER — LEVOTHYROXINE SODIUM 112 MCG PO TABS
112.0000 ug | ORAL_TABLET | Freq: Every day | ORAL | Status: DC
  Administered 2020-10-15 – 2020-10-17 (×3): 112 ug via ORAL
  Filled 2020-10-14 (×3): qty 1

## 2020-10-14 MED ORDER — MAGNESIUM CHLORIDE 64 MG PO TBEC
2.0000 | DELAYED_RELEASE_TABLET | Freq: Every day | ORAL | Status: DC
  Administered 2020-10-14 – 2020-10-16 (×3): 128 mg via ORAL
  Filled 2020-10-14 (×4): qty 2

## 2020-10-14 MED ORDER — SODIUM BICARBONATE 650 MG PO TABS
650.0000 mg | ORAL_TABLET | Freq: Two times a day (BID) | ORAL | Status: DC
  Administered 2020-10-14 – 2020-10-17 (×6): 650 mg via ORAL
  Filled 2020-10-14 (×5): qty 1

## 2020-10-14 MED ORDER — PREDNISONE 5 MG PO TABS
5.0000 mg | ORAL_TABLET | Freq: Every day | ORAL | Status: DC
  Administered 2020-10-15 – 2020-10-17 (×3): 5 mg via ORAL
  Filled 2020-10-14 (×3): qty 1

## 2020-10-14 MED ORDER — INSULIN PUMP
Freq: Three times a day (TID) | SUBCUTANEOUS | Status: DC
  Administered 2020-10-15: 7 via SUBCUTANEOUS
  Administered 2020-10-17: 2 via SUBCUTANEOUS
  Administered 2020-10-17: 0.6 via SUBCUTANEOUS
  Filled 2020-10-14: qty 1

## 2020-10-14 MED ORDER — ACETAMINOPHEN 325 MG PO TABS: 650.0000 mg | ORAL_TABLET | ORAL | Status: DC | PRN

## 2020-10-14 MED ORDER — POLYSACCHARIDE IRON COMPLEX 150 MG PO CAPS
150.0000 mg | ORAL_CAPSULE | Freq: Every day | ORAL | Status: DC
  Administered 2020-10-14 – 2020-10-16 (×3): 150 mg via ORAL
  Filled 2020-10-14 (×2): qty 1

## 2020-10-14 MED ORDER — ASPIRIN EC 81 MG PO TBEC
81.0000 mg | DELAYED_RELEASE_TABLET | Freq: Every day | ORAL | Status: DC
  Administered 2020-10-15 – 2020-10-17 (×3): 81 mg via ORAL
  Filled 2020-10-14 (×3): qty 1

## 2020-10-14 MED ORDER — INSULIN PUMP: Freq: Three times a day (TID) | SUBCUTANEOUS | Status: DC

## 2020-10-14 MED ORDER — ACETAMINOPHEN 650 MG RE SUPP: 650.0000 mg | RECTAL | Status: DC | PRN

## 2020-10-14 MED ORDER — FLUDROCORTISONE ACETATE 0.1 MG PO TABS
0.0500 mg | ORAL_TABLET | Freq: Every day | ORAL | Status: DC
  Administered 2020-10-15 – 2020-10-17 (×3): 0.05 mg via ORAL
  Filled 2020-10-14 (×3): qty 0.5

## 2020-10-14 MED ORDER — STROKE: EARLY STAGES OF RECOVERY BOOK
Freq: Once | Status: AC
  Administered 2020-10-14: 1
  Filled 2020-10-14: qty 1

## 2020-10-14 MED ORDER — MAGNESIUM CHLORIDE 64 MG PO TBEC: 1.0000 | DELAYED_RELEASE_TABLET | ORAL | Status: DC

## 2020-10-14 MED ORDER — MAGNESIUM CHLORIDE 64 MG PO TBEC
1.0000 | DELAYED_RELEASE_TABLET | Freq: Every day | ORAL | Status: DC
  Administered 2020-10-15 – 2020-10-17 (×3): 64 mg via ORAL
  Filled 2020-10-14 (×3): qty 1

## 2020-10-14 MED ORDER — ATORVASTATIN CALCIUM 80 MG PO TABS
80.0000 mg | ORAL_TABLET | Freq: Every evening | ORAL | Status: DC
  Administered 2020-10-14 – 2020-10-16 (×3): 80 mg via ORAL
  Filled 2020-10-14 (×2): qty 1

## 2020-10-14 MED ORDER — SODIUM ZIRCONIUM CYCLOSILICATE 5 G PO PACK
5.0000 g | PACK | Freq: Once | ORAL | Status: AC
  Administered 2020-10-14: 5 g via ORAL
  Filled 2020-10-14: qty 1

## 2020-10-14 MED ORDER — ACETAMINOPHEN 160 MG/5ML PO SOLN: 650.0000 mg | ORAL | Status: DC | PRN

## 2020-10-14 NOTE — ED Notes (Signed)
Lab to redraw bloodwork

## 2020-10-14 NOTE — ED Notes (Signed)
Right grip weaker than left

## 2020-10-14 NOTE — ED Notes (Signed)
Pt left dep with [facility]

## 2020-10-14 NOTE — ED Triage Notes (Signed)
Reports slurred speech on wed and R arm numbness x 24 hours.  Symptoms have mostly resolved.  Reports continued mild weakness R arm.  Pt is alert and oriented.  Resp even and unlabored.  Skin warm and dry.  nad

## 2020-10-14 NOTE — ED Notes (Signed)
EDP  made aware of K level on ISTAT.  Possible hemolyzed.  EDP recommends wait for results of pending CMP.

## 2020-10-14 NOTE — ED Provider Notes (Addendum)
[facility] EMERGENCY DEPARTMENT Provider Note   CSN: [id number] Arrival date & time: 10/14/20  1030     History Chief Complaint  Patient presents with   Extremity Weakness    R arm    Kendra Walker is a 70 y.o. female.  Patient was at the eye doctor appointment on Wednesday and at about 1400 developed slurred speech that lasted about 40 minutes.  Right arm numbness and right arm weakness.  Everything resolved except for the right arm weakness persists.  No prior history of stroke.  Also patient without any new visual changes.  It was just a routine eye checkup.      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, with long-term current use of insulin ([place]) 10/13/2019   Advanced nonexudative age-related macular degeneration of both eyes without subfoveal involvement 10/13/2019   Bilateral epiretinal membrane 10/13/2019   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 choroidal neovascularization ([place]) 10/13/2019   Groin hematoma 06/09/2019   Colon cancer screening 09/16/2017   Severe diabetic hypoglycemia ([place]) 54/62/7035   Metabolic acidosis 00/93/8182   Elevated troponin 03/07/2016   Pneumonia 03/07/2016   Septic shock ([place]) 03/07/2016   CAD (coronary artery disease) 11/11/2013   HTN (hypertension) 11/11/2013   PAD (peripheral artery disease) ([place]) 11/11/2013   Hyperlipidemia 11/11/2013   Pyuria 05/12/2013   Hypomagnesemia 03/03/2012   Hypothyroidism 01/28/2012   Anemia 09/27/2011   Adrenal insufficiency (Addison's disease) ([place]) 08/31/2011   Wound, open, leg 07/19/2011   Chronic kidney disease, stage III (moderate) ([place]) 05/14/2011    Diabetic retinopathy ([place]) 11/27/2010   History of two vessel coronary artery bypass graft 11/27/2010   Hyperkalemia 11/27/2010   Iron deficiency 11/27/2010    Past Surgical History:  Procedure Laterality Date   ABDOMINAL AORTOGRAM W/LOWER EXTREMITY N/A 06/09/2019   Procedure: ABDOMINAL AORTOGRAM W/LOWER EXTREMITY;  Surgeon: Serafina Mitchell, MD;  Location: [place] [facility];  Service: Cardiovascular;  Laterality: N/A;   APPENDECTOMY     CATARACT EXTRACTION Bilateral    CORONARY ARTERY BYPASS GRAFT     PERIPHERAL VASCULAR INTERVENTION Bilateral 06/09/2019   Procedure: PERIPHERAL VASCULAR INTERVENTION;  Surgeon: Serafina Mitchell, MD;  Location: [place] [facility];  Service: Cardiovascular;  Laterality: Bilateral;   TONSILLECTOMY     YAG LASER APPLICATION Right 9/93/7169   Procedure: YAG LASER APPLICATION;  Surgeon: Williams Che, MD;  Location: [facility];  Service: Ophthalmology;  Laterality: Right;     OB History   No obstetric history on file.     Family History  Problem Relation Age of Onset   Heart disease Mother    Hypertension Mother    Hypertension Father     Social History   Tobacco Use   Smoking status: Never   Smokeless tobacco: Never  Vaping Use   Vaping Use: Never used  Substance Use Topics   Alcohol use: No    Alcohol/week: 0.0 standard drinks   Drug use: No    Home Medications Prior to Admission medications   Medication Sig Start Date End Date Taking? Authorizing Provider  acetaminophen (TYLENOL) 500 MG tablet Take  500 mg by mouth every 6 (six) hours as needed for mild pain or headache.     [provider]  Ascorbic Acid (VITAMIN C WITH ROSE HIPS) 500 MG tablet Take 500 mg by mouth at bedtime.    [provider]  aspirin EC 81 MG tablet Take 81 mg by mouth daily.    [provider]  atorvastatin (LIPITOR) 80 MG tablet Take 1 tablet (80 mg total) by mouth daily. Patient taking differently: Take 80 mg by mouth every evening.  11/11/13   Arnoldo Lenis, MD  Calcium Carbonate (CALCIUM 600 PO) Take 600 mg by mouth in the morning and at bedtime.    [provider]  Cholecalciferol (VITAMIN D3) 50 MCG (2000 UT) TABS Take 2,000 Units by mouth daily.    [provider]  fludrocortisone (FLORINEF) 0.1 MG tablet Take 0.05 mg by mouth daily. 10/21/13   [provider]  GLUCAGEN HYPOKIT 1 MG SOLR injection Inject into the muscle. 04/25/20   [provider]  HUMALOG 100 UNIT/ML injection Inject into the skin See admin instructions. VIA INSULIN PUMP 07/21/15   [provider]  Insulin Human (INSULIN PUMP) SOLN Inject into the skin. HUMALOG 100 UNIT/ML    [provider]  iron polysaccharides (NIFEREX) 150 MG capsule Take 150 mg by mouth at bedtime.     [provider]  levothyroxine (SYNTHROID) 112 MCG tablet Take by mouth. 12/09/19   [provider]  magnesium chloride (SLOW-MAG) 64 MG TBEC SR tablet Take 1-2 tablets by mouth See admin instructions. TAKE 1 TABLET IN THE MORNING & TAKE 2 TABLETS AT NIGHT.    [provider]  Multiple Vitamin (MULTIVITAMIN WITH MINERALS) TABS tablet Take 1 tablet by mouth daily.    [provider]  Multiple Vitamins-Minerals (PRESERVISION AREDS 2 PO) Take 1 tablet by mouth daily.     [provider]  Omega-3 Fatty Acids (FISH OIL) 1200 MG CAPS Take 1,200 mg by mouth daily.    [provider]  POLY-IRON 150 FORTE 150-25-1 MG-MCG-MG CAPS Take 1 capsule by mouth 2 (two) times daily. 02/14/20   [provider]  predniSONE (DELTASONE) 5 MG tablet Take 5 mg by mouth daily with breakfast.    [provider]  sodium bicarbonate 650 MG tablet Take 650 mg by mouth in the morning and at bedtime.    [provider]  VELTASSA 8.4 g packet Take 8.4 g by mouth every Wednesday.  03/29/17   [provider]    Allergies    Bactrim [sulfamethoxazole-trimethoprim], Tape,  Fluorescein, and Procrit [epoetin (alfa)]  Review of Systems   Review of Systems  Constitutional:  Negative for chills and fever.  HENT:  Negative for ear pain and sore throat.   Eyes:  Negative for pain and visual disturbance.  Respiratory:  Negative for cough and shortness of breath.   Cardiovascular:  Negative for chest pain and palpitations.  Gastrointestinal:  Negative for abdominal pain and vomiting.  Genitourinary:  Negative for dysuria and hematuria.  Musculoskeletal:  Negative for arthralgias and back pain.  Skin:  Negative for color change and rash.  Neurological:  Positive for speech difficulty, weakness and numbness. Negative for seizures and syncope.  All other systems reviewed and are negative.  Physical Exam Updated Vital Signs BP (!) 208/71   Pulse 67   Temp 98.6 F (37 C)   Resp 16   Ht 1.575 m (5\' 2" )   Wt 56.7 kg  SpO2 98%   BMI 22.86 kg/m   Physical Exam Vitals and nursing note reviewed.  Constitutional:      General: She is not in acute distress.    Appearance: Normal appearance. She is well-developed.  HENT:     Head: Normocephalic and atraumatic.  Eyes:     Extraocular Movements: Extraocular movements intact.     Conjunctiva/sclera: Conjunctivae normal.     Pupils: Pupils are equal, round, and reactive to light.  Cardiovascular:     Rate and Rhythm: Normal rate and regular rhythm.     Heart sounds: No murmur heard. Pulmonary:     Effort: Pulmonary effort is normal. No respiratory distress.     Breath sounds: Normal breath sounds.  Abdominal:     Palpations: Abdomen is soft.     Tenderness: There is no abdominal tenderness.  Musculoskeletal:        General: Deformity present. Normal range of motion.     Cervical back: Neck supple.     Comments: Patient with deformity to the distal right lower extremity ankle area following a ankle fracture from a long time ago.  Skin:    General: Skin is warm and dry.     Capillary Refill: Capillary refill  takes less than 2 seconds.  Neurological:     Mental Status: She is alert and oriented to person, place, and time.     Cranial Nerves: No cranial nerve deficit.     Sensory: No sensory deficit.     Motor: Weakness present.     Comments: 4 out of 5 weakness of the right hand.    ED Results / Procedures / Treatments   Labs (all labs ordered are listed, but only abnormal results are displayed) Labs Reviewed  RESP PANEL BY RT-PCR (FLU A&B, COVID) ARPGX2  ETHANOL  PROTIME-INR  APTT  CBC  DIFFERENTIAL  COMPREHENSIVE METABOLIC PANEL  RAPID URINE DRUG SCREEN, HOSP PERFORMED  URINALYSIS, ROUTINE W REFLEX MICROSCOPIC  I-STAT CHEM 8, ED    EKG EKG Interpretation  Date/Time:  Friday October 14 2020 10:49:02 EDT Ventricular Rate:  81 PR Interval:  154 QRS Duration: 83 QT Interval:  359 QTC Calculation: 417 R Axis:   80 Text Interpretation: Sinus rhythm Low voltage, precordial leads Confirmed by Fredia Sorrow [phone]) on 10/14/2020 11:06:17 AM  Radiology OCT, Retina - OU - Both Eyes  Result Date: 10/12/2020 Right Eye Quality was good. Scan locations included subfoveal. Central Foveal Thickness: 181. Findings include central retinal atrophy, outer retinal atrophy, inner retinal atrophy, abnormal foveal contour, disciform scar. Left Eye Quality was good. Scan locations included subfoveal. Central Foveal Thickness: 307. Findings include abnormal foveal contour, disciform scar. Notes Diffuse retinal atrophy OD, stable over time with subfoveal scarring OS, stable with old disciform scar temporally.  No active maculopathy.   Procedures Procedures   Medications Ordered in ED Medications - No data to display  ED Course  I have reviewed the triage vital signs and the nursing notes.  Pertinent labs & imaging results that were available during my care of the patient were reviewed by me and considered in my medical decision making (see chart for details).    MDM Rules/Calculators/A&P                            CRITICAL CARE Performed by: Fredia Sorrow Total critical care time: 45 minutes Critical care time was exclusive of separately billable procedures and treating other patients. Critical  care was necessary to treat or prevent imminent or life-threatening deterioration. Critical care was time spent personally by me on the following activities: development of treatment plan with patient and/or surrogate as well as nursing, discussions with consultants, evaluation of patient's response to treatment, examination of patient, obtaining history from patient or surrogate, ordering and performing treatments and interventions, ordering and review of laboratory studies, ordering and review of radiographic studies, pulse oximetry and re-evaluation of patient's condition.   Clinically sounds as if patient had a CVA.  Still has 4 out of 5 weakness to the right hand.  Patient not a tPA candidate based on the timing.  No significant VAN symptoms.  Will get head CT.  Patient states she is not able to have an MRI because of stents that are in her groin.  These were placed 2 years ago at [facility].  We will investigate whether that truly limits MRI.  Code stroke order set initiated.  Code stroke not activated.  Patient not tPA candidate.  Head CT without any acute findings.  Stroke work-up significant for glucose of 187.  Drug screen negative.  Urinalysis significant for greater than 50 white blood cells.  Nitrite negative bacteria rare.  Will send urine culture.  Electrolytes potassium 5.4.  Glucose 194.  GFR 35 which is somewhat baseline for this patient known to have chronic kidney disease.  Alcohol was not present.  INR was normal.  The previous potassiums of 8.2 and all that were all hemolysis.  CBC without leukocytosis.  Hemoglobin was 14.1.  Since head CT was negative patient underwent MRI.  MRI shows evidence of acute stroke.  Shows a punctate acute cortical infarct within the posterior left  frontal lobe motor strip.  This could explain her symptoms on the right side.  Small chronic cortical infarcts within the left parietal occipital lobes with associated chronic hemosiderin deposits at the site.  Patient's got a need admission for the acute stroke.  Was trying to contact Dr. Merlene Laughter who is on-call for neurology up until 1800.  Have called several times including his office had not been able to get a hold of him.  We will discussed with the hospitalist for admission.  Discussed and plan is admission to [facility] neurology will follow their hospitalist will do the admit.  Final Clinical Impression(s) / ED Diagnoses Final diagnoses:  Cerebrovascular accident (CVA), unspecified mechanism [facility])    Rx / DC Orders ED Discharge Orders     None        Fredia Sorrow, MD 10/14/20 1112    Fredia Sorrow, MD 10/14/20 1604    Fredia Sorrow, MD 10/14/20 1609   Clarification: Did speak with Dr. Cheral Marker teleneurology.  He recommended hospitalist admission to [facility].  He texted Dr. Aaron Mose the neuro hospitalist there.  In the meantime Dr. Merlene Laughter did call back.  And he agreed also that patient be transferred to [facility] because work-up for the strokes over the weekends is limited with challenges with ultrasound and echocardiogram.  CT angio can be done.  Discussed with Dr. Sheran Lawless the hospitalist who will arrange the admission to [facility], MD 10/14/20 1625

## 2020-10-14 NOTE — H&P (Signed)
History and Physical  [name] FYB:[id number] DOB: 27-Nov-1950 DOA: 10/14/2020  Referring physician: Dr Rogene Houston, ED physician PCP: Curlene Labrum, MD  Outpatient Specialists:  Zadie Rhine, ophthalmology [facility], Cardiology Mariane Baumgarten, Nephrology [facility]  Patient Coming From: home  Chief Complaint: right arm numbness  HPI: Kendra Walker is a 70 y.o. female with a history of type 1 diabetes with retinopathy, nephropathy, hypertension, adrenal insufficiency with hypothyroidism, peripheral artery disease.  The patient had blurred vision and reported slurred speech that lasted approximately 40 minutes at 2 PM.  Right arm numbness right arm weakness started earlier today, but she did not think anything of this.  She had a routine eye checkup and was told her that they thought she had a stroke.  She was directed to come to the emergency department for evaluation.  Here, she reports a little bit of right arm weakness but resolution of all her other symptoms.  No palliating or provoking factors.  Emergency Department Course: CT head negative.  MRI shows acute cortical infarct within the left motor strip.  Small chronic cortical infarcts within the left parietal occipital lobe.  Blood pressures been in the 140s over 55 and as high as 200/71.  Other blood work is relatively normal.  Creatinine 1.59 and a potassium of 5.4.  Review of Systems:   Pt denies any fevers, chills, nausea, vomiting, diarrhea, constipation, abdominal pain, shortness of breath, dyspnea on exertion, orthopnea, cough, wheezing, palpitations, headache, vision changes, lightheadedness, dizziness, melena, rectal bleeding.  Review of systems are otherwise negative  Past Medical History:  Diagnosis Date   Cancer ([place])    Squamous Cell   Diabetes mellitus without complication ([place])    Hyperlipemia    Hypertension    Hypothyroidism    PAD (peripheral artery disease) ([place])    Past Surgical  History:  Procedure Laterality Date   ABDOMINAL AORTOGRAM W/LOWER EXTREMITY N/A 06/09/2019   Procedure: ABDOMINAL AORTOGRAM W/LOWER EXTREMITY;  Surgeon: Serafina Mitchell, MD;  Location: [facility];  Service: Cardiovascular;  Laterality: N/A;   APPENDECTOMY     CATARACT EXTRACTION Bilateral    CORONARY ARTERY BYPASS GRAFT     PERIPHERAL VASCULAR INTERVENTION Bilateral 06/09/2019   Procedure: PERIPHERAL VASCULAR INTERVENTION;  Surgeon: Serafina Mitchell, MD;  Location: [facility];  Service: Cardiovascular;  Laterality: Bilateral;   TONSILLECTOMY     YAG LASER APPLICATION Right 06/18/7822   Procedure: YAG LASER APPLICATION;  Surgeon: Williams Che, MD;  Location: [facility];  Service: Ophthalmology;  Laterality: Right;   Social History:  reports that she has never smoked. She has never used smokeless tobacco. She reports that she does not drink alcohol and does not use drugs. Patient lives at home  Allergies  Allergen Reactions   Bactrim [Sulfamethoxazole-Trimethoprim] Other (See Comments)    Elevates potassium.    Tape Other (See Comments)    not allergic but causes sensitivity.   Fluorescein Rash   Procrit [Epoetin (Alfa)] Rash    Family History  Problem Relation Age of Onset   Heart disease Mother    Hypertension Mother    Hypertension Father       Prior to Admission medications   Medication Sig Start Date End Date Taking? Authorizing Provider  acetaminophen (TYLENOL) 500 MG tablet Take 500 mg by mouth every 6 (six) hours as needed for mild pain or headache.    Yes [provider]  Ascorbic Acid (VITAMIN C WITH ROSE HIPS)  500 MG tablet Take 500 mg by mouth at bedtime.   Yes [provider]  aspirin EC 81 MG tablet Take 81 mg by mouth daily.   Yes [provider]  atorvastatin (LIPITOR) 80 MG tablet Take 1 tablet (80 mg total) by mouth daily. Patient taking differently: Take 80 mg by mouth every evening. 11/11/13  Yes BranchAlphonse Guild, MD   Calcium Carbonate (CALCIUM 600 PO) Take 600 mg by mouth in the morning and at bedtime.   Yes [provider]  Cholecalciferol (VITAMIN D3) 50 MCG (2000 UT) TABS Take 2,000 Units by mouth daily.   Yes [provider]  fludrocortisone (FLORINEF) 0.1 MG tablet Take 0.05 mg by mouth daily. 10/21/13  Yes [provider]  HUMALOG 100 UNIT/ML injection Inject into the skin See admin instructions. VIA INSULIN PUMP 07/21/15  Yes [provider]  Insulin Human (INSULIN PUMP) SOLN Inject into the skin. HUMALOG 100 UNIT/ML   Yes [provider]  iron polysaccharides (NIFEREX) 150 MG capsule Take 150 mg by mouth at bedtime.    Yes [provider]  levothyroxine (SYNTHROID) 112 MCG tablet Take 112 mcg by mouth daily. 12/09/19  Yes [provider]  magnesium chloride (SLOW-MAG) 64 MG TBEC SR tablet Take 1-2 tablets by mouth See admin instructions. TAKE 1 TABLET IN THE MORNING & TAKE 2 TABLETS AT NIGHT.   Yes [provider]  Multiple Vitamin (MULTIVITAMIN WITH MINERALS) TABS tablet Take 1 tablet by mouth daily.   Yes [provider]  Multiple Vitamins-Minerals (PRESERVISION AREDS 2 PO) Take 1 tablet by mouth daily.    Yes [provider]  Omega-3 Fatty Acids (FISH OIL) 1200 MG CAPS Take 1,200 mg by mouth daily.   Yes [provider]  POLY-IRON 150 FORTE 150-25-1 MG-MCG-MG CAPS Take 1 capsule by mouth daily at 6 (six) AM. 02/14/20  Yes [provider]  predniSONE (DELTASONE) 5 MG tablet Take 5 mg by mouth daily with breakfast.   Yes [provider]  sodium bicarbonate 650 MG tablet Take 650 mg by mouth in the morning and at bedtime.   Yes [provider]  GLUCAGEN HYPOKIT 1 MG SOLR injection Inject into the muscle. 04/25/20   [provider]    Physical Exam: BP (!) 148/55   Pulse 80   Temp 98.6 F (37 C)   Resp 13   Ht 5\' 2"  (1.575 m)   Wt 56.7 kg   SpO2 100%   BMI 22.86  kg/m   General: Older female. Awake and alert and oriented x3. No acute cardiopulmonary distress.  HEENT: Normocephalic atraumatic.  Right and left ears normal in appearance.  Pupils equal, round, reactive to light. Extraocular muscles are intact. Sclerae anicteric and noninjected.  Moist mucosal membranes. No mucosal lesions.  Neck: Neck supple without lymphadenopathy. No carotid bruits. No masses palpated.  Cardiovascular: Regular rate with normal S1-S2 sounds. No murmurs, rubs, gallops auscultated. No JVD.  Respiratory: Good respiratory effort with no wheezes, rales, rhonchi. Lungs clear to auscultation bilaterally.  No accessory muscle use. Abdomen: Soft, nontender, nondistended. Active bowel sounds. No masses or hepatosplenomegaly  Skin: No rashes, lesions, or ulcerations.  Dry, warm to touch. 2+ dorsalis pedis and radial pulses. Musculoskeletal: No calf or leg pain. All major joints not erythematous nontender.  No upper or lower joint deformation.  Good ROM.  No contractures  Psychiatric: Intact judgment and insight. Pleasant and cooperative. Neurologic: Mild right arm weakness.  Left arm normal  and lower extremities strength 5 out of 5.  Sensation grossly intact.  Cranial nerves II through XII are grossly intact.           Labs on Admission: I have personally reviewed following labs and imaging studies  CBC: Recent Labs  Lab 10/14/20 1220 10/14/20 1226  WBC 9.6  --   NEUTROABS 6.5  --   HGB 14.1 15.3*  HCT 43.0 45.0  MCV 95.3  --   PLT 278  --    Basic Metabolic Panel: Recent Labs  Lab 10/14/20 1226 10/14/20 1325  NA 135 137  K 8.2* 5.4*  CL 100 100  CO2  --  30  GLUCOSE 170* 194*  BUN 71* 45*  CREATININE 1.80* 1.59*  CALCIUM  --  9.5   GFR: Estimated Creatinine Clearance: 26.4 mL/min (A) (by C-G formula based on SCr of 1.59 mg/dL (H)). Liver Function Tests: Recent Labs  Lab 10/14/20 1325  AST 28  ALT 23  ALKPHOS 55  BILITOT 0.8  PROT 6.5  ALBUMIN 3.8    No results for input(s): LIPASE, AMYLASE in the last 168 hours. No results for input(s): AMMONIA in the last 168 hours. Coagulation Profile: Recent Labs  Lab 10/14/20 1311  INR 0.9   Cardiac Enzymes: No results for input(s): CKTOTAL, CKMB, CKMBINDEX, TROPONINI in the last 168 hours. BNP (last 3 results) No results for input(s): PROBNP in the last 8760 hours. HbA1C: No results for input(s): HGBA1C in the last 72 hours. CBG: Recent Labs  Lab 10/14/20 1509  GLUCAP 187*   Lipid Profile: No results for input(s): CHOL, HDL, LDLCALC, TRIG, CHOLHDL, LDLDIRECT in the last 72 hours. Thyroid Function Tests: No results for input(s): TSH, T4TOTAL, FREET4, T3FREE, THYROIDAB in the last 72 hours. Anemia Panel: No results for input(s): VITAMINB12, FOLATE, FERRITIN, TIBC, IRON, RETICCTPCT in the last 72 hours. Urine analysis:    Component Value Date/Time   COLORURINE YELLOW 10/14/2020 1405   APPEARANCEUR CLOUDY (A) 10/14/2020 1405   LABSPEC 1.012 10/14/2020 1405   PHURINE 6.0 10/14/2020 1405   GLUCOSEU NEGATIVE 10/14/2020 1405   HGBUR NEGATIVE 10/14/2020 1405   BILIRUBINUR NEGATIVE 10/14/2020 1405   KETONESUR 5 (A) 10/14/2020 1405   PROTEINUR NEGATIVE 10/14/2020 1405   NITRITE NEGATIVE 10/14/2020 1405   LEUKOCYTESUR LARGE (A) 10/14/2020 1405   Sepsis Labs: @LABRCNTIP (procalcitonin:4,lacticidven:4) ) Recent Results (from the past 240 hour(s))  Resp Panel by RT-PCR (Flu A&B, Covid) Nasopharyngeal Swab     Status: None   Collection Time: 10/14/20 11:15 AM   Specimen: Nasopharyngeal Swab; Nasopharyngeal(NP) swabs in vial transport medium  Result Value Ref Range Status   SARS Coronavirus 2 by RT PCR NEGATIVE NEGATIVE Final    Comment: (NOTE) SARS-CoV-2 target nucleic acids are NOT DETECTED.  The SARS-CoV-2 RNA is generally detectable in upper respiratory specimens during the acute phase of infection. The lowest concentration of SARS-CoV-2 viral copies this assay can detect  is 138 copies/mL. A negative result does not preclude SARS-Cov-2 infection and should not be used as the sole basis for treatment or other patient management decisions. A negative result may occur with  improper specimen collection/handling, submission of specimen other than nasopharyngeal swab, presence of viral mutation(s) within the areas targeted by this assay, and inadequate number of viral copies(<138 copies/mL). A negative result must be combined with clinical observations, patient history, and epidemiological information. The expected result is Negative.  Fact Sheet for Patients:  EntrepreneurPulse.com.au  Fact Sheet for Healthcare Providers:  IncredibleEmployment.be  This  test is no t yet approved or cleared by the [place] and  has been authorized for detection and/or diagnosis of SARS-CoV-2 by FDA under an Emergency Use Authorization (EUA). This EUA will remain  in effect (meaning this test can be used) for the duration of the COVID-19 declaration under Section 564(b)(1) of the Act, 21 U.S.C.section 360bbb-3(b)(1), unless the authorization is terminated  or revoked sooner.       Influenza A by PCR NEGATIVE NEGATIVE Final   Influenza B by PCR NEGATIVE NEGATIVE Final    Comment: (NOTE) The Xpert Xpress SARS-CoV-2/FLU/RSV plus assay is intended as an aid in the diagnosis of influenza from Nasopharyngeal swab specimens and should not be used as a sole basis for treatment. Nasal washings and aspirates are unacceptable for Xpert Xpress SARS-CoV-2/FLU/RSV testing.  Fact Sheet for Patients: EntrepreneurPulse.com.au  Fact Sheet for Healthcare Providers: IncredibleEmployment.be  This test is not yet approved or cleared by the [place] FDA and has been authorized for detection and/or diagnosis of SARS-CoV-2 by FDA under an Emergency Use Authorization (EUA). This EUA will remain in effect  (meaning this test can be used) for the duration of the COVID-19 declaration under Section 564(b)(1) of the Act, 21 U.S.C. section 360bbb-3(b)(1), unless the authorization is terminated or revoked.  Performed at [facility], [address], [place], [place] [address]      Radiological Exams on Admission: CT HEAD WO CONTRAST  Result Date: 10/14/2020 CLINICAL DATA:  Slurred speech, right arm numbness EXAM: CT HEAD WITHOUT CONTRAST TECHNIQUE: Contiguous axial images were obtained from the base of the skull through the vertex without intravenous contrast. COMPARISON:  None. FINDINGS: Brain: There is no evidence of acute intracranial hemorrhage, extra-axial fluid collection, or acute infarct. The ventricles are normal in size. There is no mass lesion. There is no midline shift. There is no significant burden of chronic white matter microangiopathy. Vascular: There is dense calcification of the bilateral cavernous ICAs. Skull: Normal. Negative for fracture or focal lesion. Sinuses/Orbits: The imaged paranasal sinuses are clear. Bilateral lens implants are in place. The globes and orbits are otherwise unremarkable. Other: There is a 1.3 cm partially calcified nodule in the right frontoparietal scalp. IMPRESSION: 1. No acute intracranial pathology. 2. 1.3 cm partially calcified in the right frontoparietal scalp is indeterminate. Correlate with physical exam. Electronically Signed   By: Valetta Mole M.D.   On: 10/14/2020 12:13   MR Brain Wo Contrast (neuro protocol)  Result Date: 10/14/2020 CLINICAL DATA:  Neuro deficit, acute, stroke suspected. Additional history provided: Patient developed slurred speech lasting approximately 40 minutes, right arm numbness, right arm weakness, all now resolved except for persistent right arm weakness. EXAM: MRI HEAD WITHOUT CONTRAST TECHNIQUE: Multiplanar, multiecho pulse sequences of the brain and surrounding structures were obtained without intravenous contrast.  COMPARISON:  Head CT 10/14/2020. FINDINGS: Brain: Mild generalized cerebral atrophy. Punctate acute cortical infarct within the posterior left frontal lobe (motor strip) (series 5, image 28) (series 7, image 16). Small chronic cortical infarcts within the left parietooccipital lobes with associated chronic hemosiderin deposition at this site. There are a few additional scattered supratentorial chronic microhemorrhages No evidence of an intracranial mass. No extra-axial fluid collection. No midline shift. Vascular: Maintained flow voids within the proximal large arterial vessels. Skull and upper cervical spine: Focal suspicious marrow lesion. Sinuses/Orbits: Visualized orbits show no acute finding. Bilateral lens replacements. Other: Trace fluid within the left mastoid air cells. Redemonstrated partially calcified right parietal scalp lesion, nonspecific but likely  reflecting a cyst. IMPRESSION: Punctate acute cortical infarct within the posterior left frontal lobe (motor strip). Small chronic cortical infarcts within the left parietooccipital lobes with associated chronic hemosiderin deposition at this site. Mild generalized cerebral atrophy. Trace fluid within the left mastoid air cells. Electronically Signed   By: Kellie Simmering D.O.   On: 10/14/2020 14:52    EKG: Independently reviewed.  Sinus rhythm with low voltage.  No acute ST changes.  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  We also discussed care given by  the ED provider.  Acute stroke secondary to ischemia Observation on telemetry MRA head and neck as patient has CKD3 with SCr 1.6 Echocardiogram tomorrow Hemoglobin A1c, lipid panel in the morning PT/OT/speech therapy consult Full aspirin Type 1 diabetes On insulin pump, which we will continue Adrenal insufficiency Continue steroid replacement Coronary artery disease with hypertension Not on antihypertensives Chronic kidney disease stage III Will avoid nephrotoxic medications/contrast Hypothyroidism Hyperkalemia Will give dose of lokelma Repeat BMP in AM  DVT prophylaxis: lovenox Consultants: neuro Code Status: full Family Communication: husband present for interview and exam  Disposition Plan: pending.   Truett Mainland, DO

## 2020-10-15 ENCOUNTER — Inpatient Hospital Stay (HOSPITAL_COMMUNITY): Payer: Medicare Other

## 2020-10-15 ENCOUNTER — Encounter (HOSPITAL_COMMUNITY): Payer: Self-pay | Admitting: Family Medicine

## 2020-10-15 DIAGNOSIS — I10 Essential (primary) hypertension: Secondary | ICD-10-CM

## 2020-10-15 DIAGNOSIS — I639 Cerebral infarction, unspecified: Secondary | ICD-10-CM

## 2020-10-15 DIAGNOSIS — E103553 Type 1 diabetes mellitus with stable proliferative diabetic retinopathy, bilateral: Secondary | ICD-10-CM

## 2020-10-15 DIAGNOSIS — I739 Peripheral vascular disease, unspecified: Secondary | ICD-10-CM

## 2020-10-15 DIAGNOSIS — I2510 Atherosclerotic heart disease of native coronary artery without angina pectoris: Secondary | ICD-10-CM

## 2020-10-15 DIAGNOSIS — E875 Hyperkalemia: Secondary | ICD-10-CM

## 2020-10-15 LAB — LIPID PANEL
Cholesterol: 154 mg/dL (ref 0–200)
HDL: 76 mg/dL (ref >40)
LDL Cholesterol: 67 mg/dL (ref 0–99)
Total CHOL/HDL Ratio: 2 RATIO
Triglycerides: 55 mg/dL (ref <150)
VLDL: 11 mg/dL (ref 0–40)

## 2020-10-15 LAB — GLUCOSE, CAPILLARY
Glucose-Capillary: 76 mg/dL (ref 70–99)
Glucose-Capillary: 196 mg/dL — ABNORMAL HIGH (ref 70–99)
Glucose-Capillary: 202 mg/dL — ABNORMAL HIGH (ref 70–99)
Glucose-Capillary: 173 mg/dL — ABNORMAL HIGH (ref 70–99)
Glucose-Capillary: 167 mg/dL — ABNORMAL HIGH (ref 70–99)

## 2020-10-15 LAB — BASIC METABOLIC PANEL
Anion gap: 8 (ref 5–15)
BUN: 44 mg/dL — ABNORMAL HIGH (ref 8–23)
CO2: 28 mmol/L (ref 22–32)
Calcium: 9.3 mg/dL (ref 8.9–10.3)
Chloride: 101 mmol/L (ref 98–111)
Creatinine, Ser: 1.78 mg/dL — ABNORMAL HIGH (ref 0.44–1.00)
GFR, Estimated: 31 mL/min — ABNORMAL LOW (ref >60)
Glucose, Bld: 215 mg/dL — ABNORMAL HIGH (ref 70–99)
Potassium: 4.5 mmol/L (ref 3.5–5.1)
Sodium: 137 mmol/L (ref 135–145)

## 2020-10-15 LAB — HEMOGLOBIN A1C
Hgb A1c MFr Bld: 8 % — ABNORMAL HIGH (ref 4.8–5.6)
Mean Plasma Glucose: 182.9 mg/dL

## 2020-10-15 IMAGING — MR MR MRA NECK WO/W CM
4 of 7 series · 19 of 48 positions shown · IV contrast (Yes GAD)
Comparison: Head CT and brain MRI [DATE].

CLINICAL DATA: 69-year-old female with punctate cortical infarct in
the left MCA territory on MRI yesterday.

EXAM:
MRA NECK WITHOUT AND WITH CONTRAST
TECHNIQUE: Multiplanar and multiecho pulse sequences of the neck were obtained
without and with intravenous contrast. Angiographic images of the
neck were obtained using MRA technique without and with intravenous
contrast.
CONTRAST:  5.6mL GADAVIST GADOBUTROL 1 MMOL/ML IV SOLN

[Series 500: cor cemra ft · coronal · 1.2mm · 0.59mm/px · 7 of 105 slices shown]
[im 1/105]
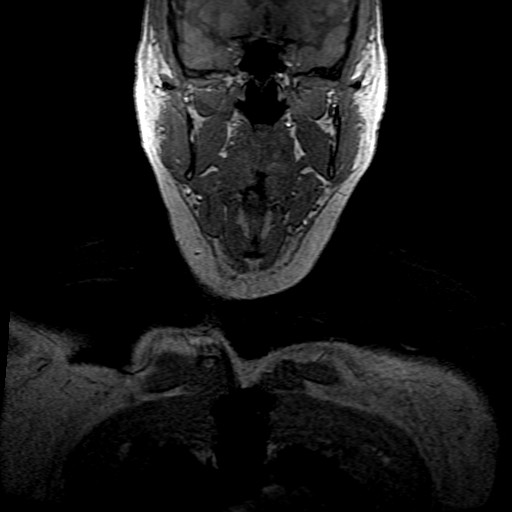
[im 18/105]
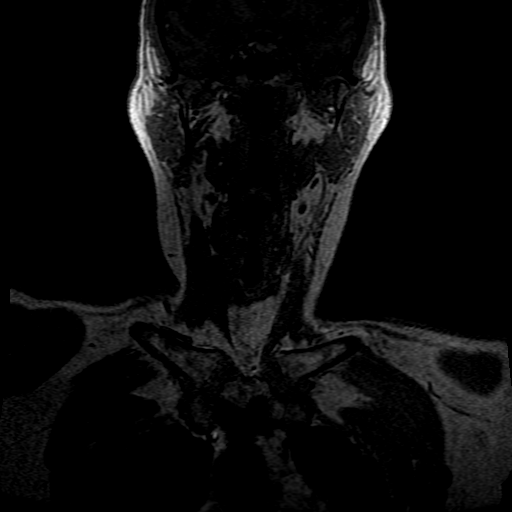
[im 35/105]
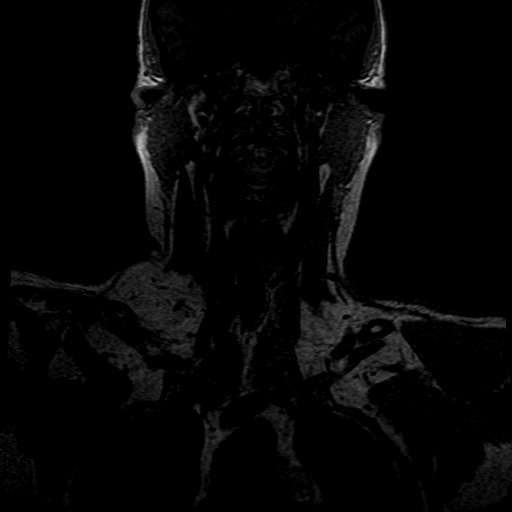
[im 53/105]
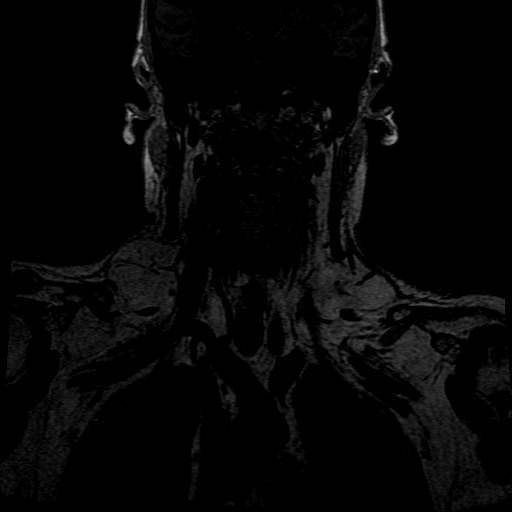
[im 70/105]
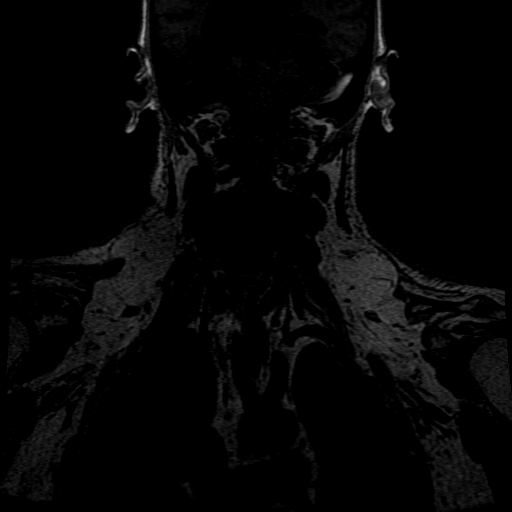
[im 87/105]
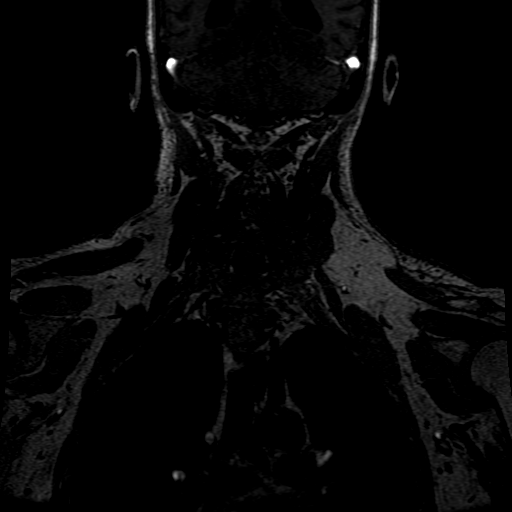
[im 105/105]
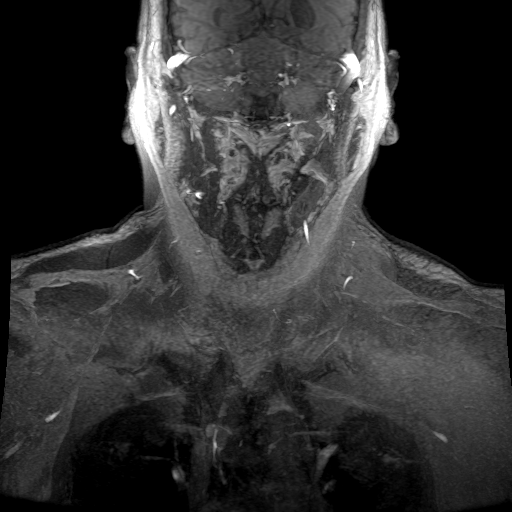

[Series 501: ph1/cor cemra ft · coronal · 1.2mm · 0.59mm/px · 6 of 104 slices shown]
[im 1/104]
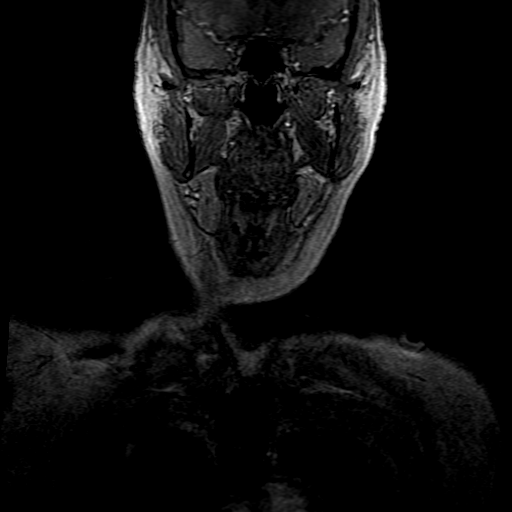
[im 18/104]
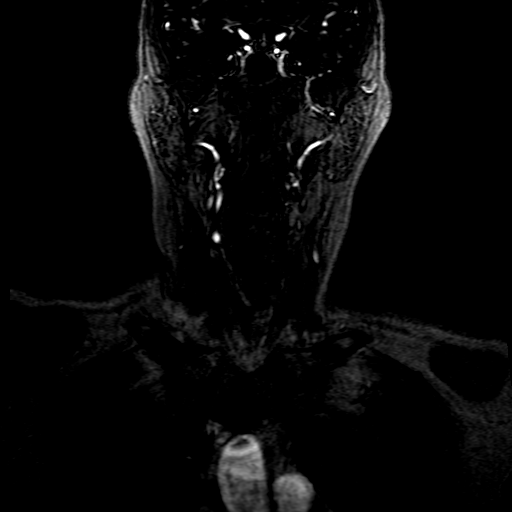
[im 35/104]
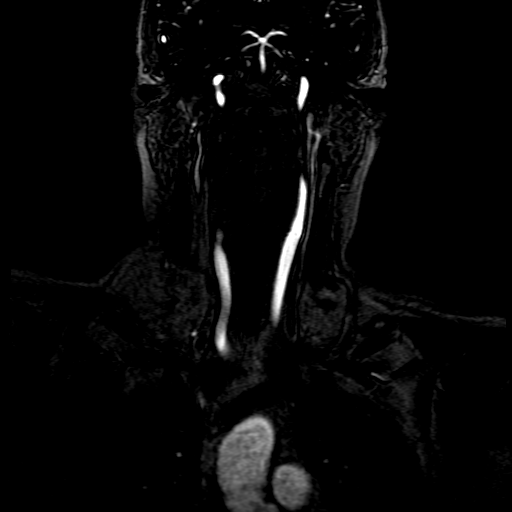
[im 52/104]
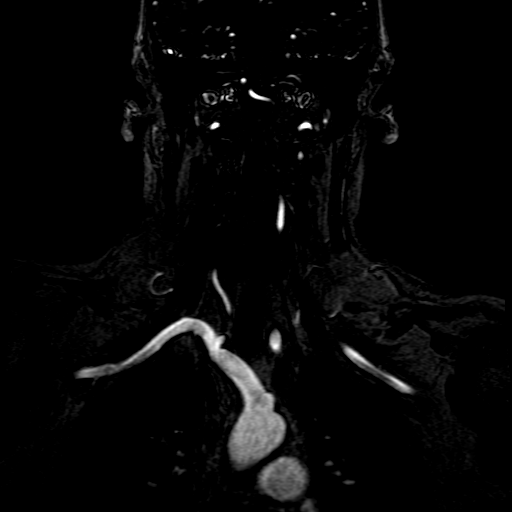
[im 69/104]
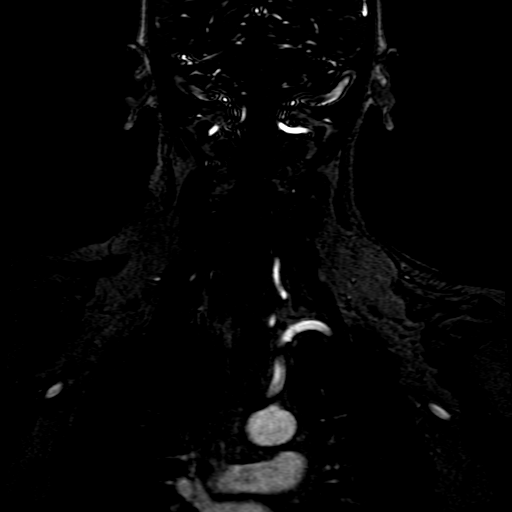
[im 86/104]
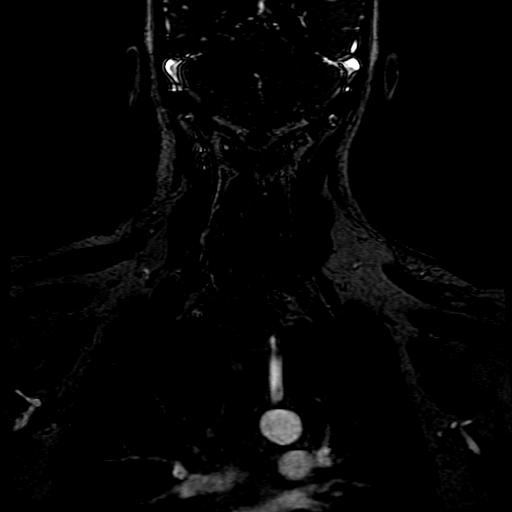

[Series 502: ph2/cor cemra ft · coronal · 1.2mm · 0.59mm/px · 3 of 104 slices shown]
[im 18/104]
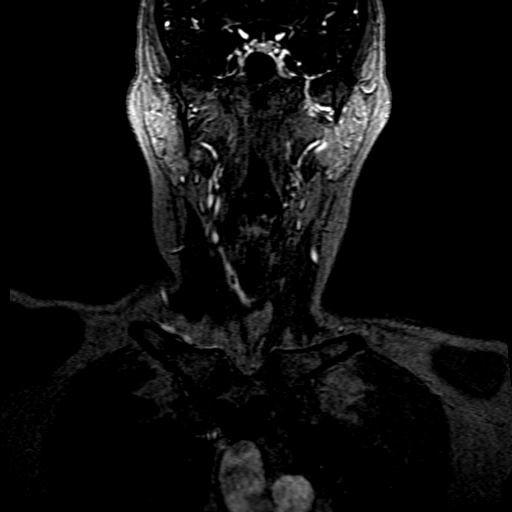
[im 52/104]
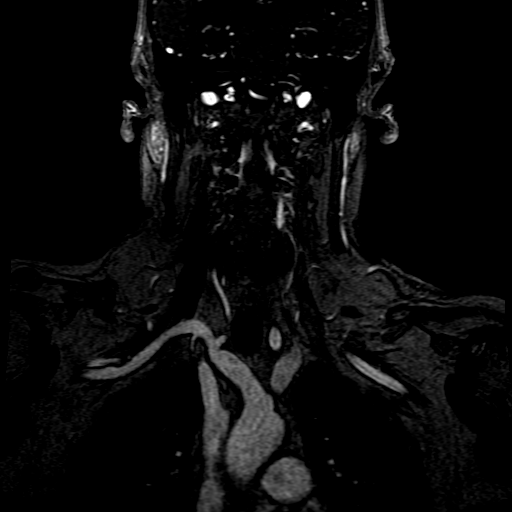
[im 86/104]
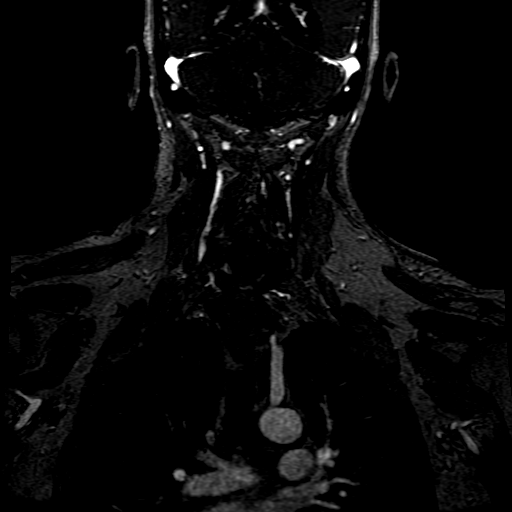

[((date))-((date)) · coronal · 1.2mm · 0.59mm/px · 3 of 105 slices shown]
[im 18/105]
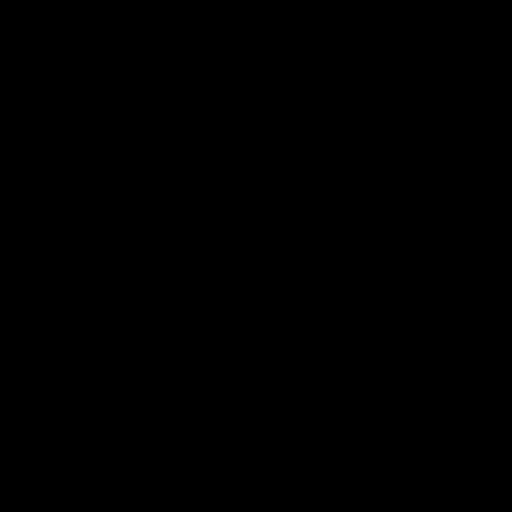
[im 53/105]
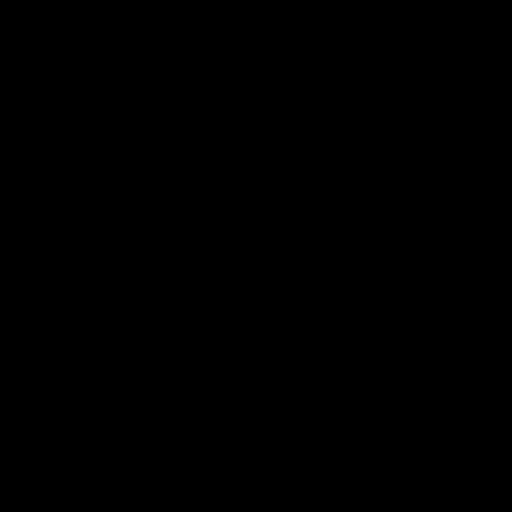
[im 87/105]
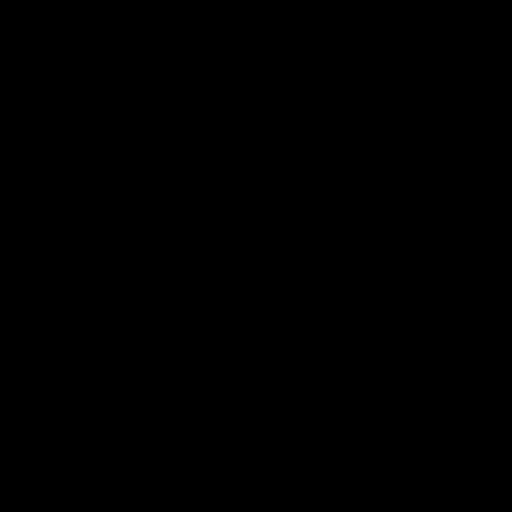

[19 of 48 positions shown; findings below may reference images not displayed]

FINDINGS: Precontrast time-of-flight imaging demonstrates a 3 vessel arch
configuration with antegrade flow in the bilateral cervical carotid
and vertebral arteries. Antegrade flow continues to the skull base.
Relatively codominant vertebrals.

Postcontrast neck MRA imaging. Proximal great vessels are within
normal limits.

Normal right CCA. Right carotid bifurcation and cervical right ICA
are within normal limits.

Normal left CCA aside from mild tortuosity. Left carotid bifurcation
and cervical right ICA are within normal limits.

Normal proximal subclavian arteries and vertebral artery origins.
Fairly codominant vertebral arteries appear normal to the skull
base.

Grossly normal visible postcontrast anterior and posterior
circulation.
IMPRESSION: Negative neck MRA.

## 2020-10-15 IMAGING — MR MR MRA HEAD W/O CM
1 series · 19 of 48 positions shown · non-contrast
Comparison: Brain MRI yesterday.  Neck MRA today.

CLINICAL DATA: 69-year-old female with punctate cortical infarct in
the left MCA territory on MRI yesterday.

EXAM:
MRA HEAD WITHOUT CONTRAST
TECHNIQUE: Angiographic images of the Circle of Willis were acquired using MRA
technique without intravenous contrast.

[Series 2: ax (id) · axial · 1.0mm · 0.43mm/px · z∈[-70,+13]mm · 19 of 176 slices shown]
[im 1/176]
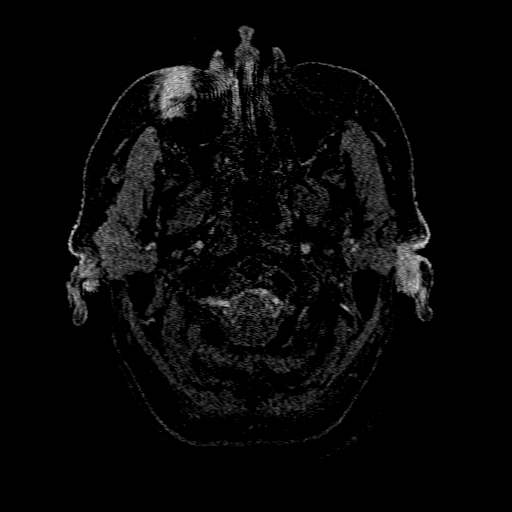
[im 4/176]
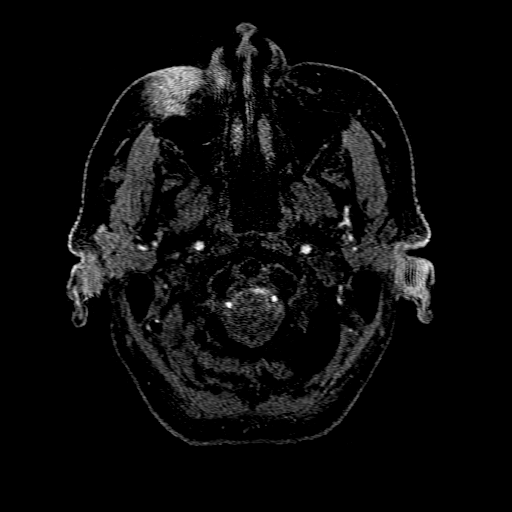
[im 8/176]
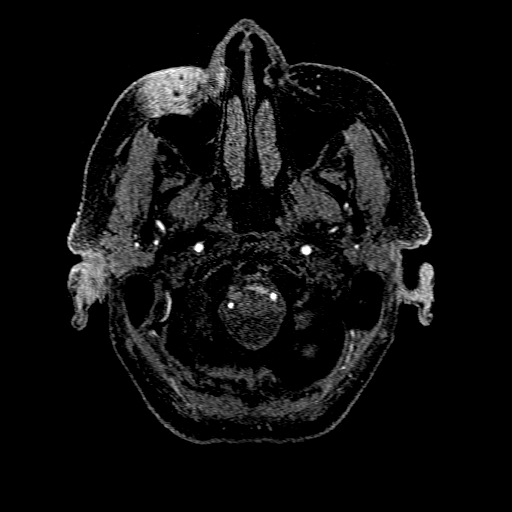
[im 12/176]
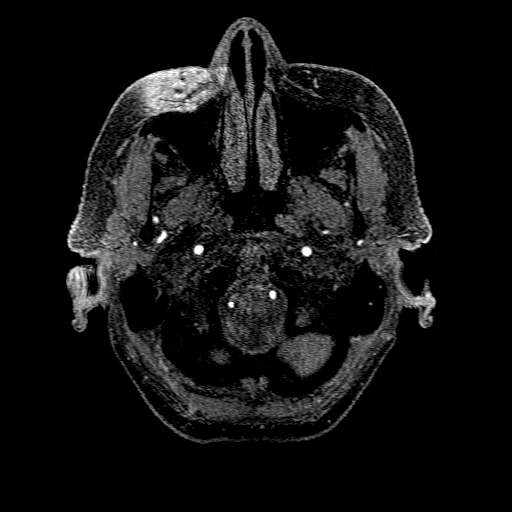
[im 15/176]
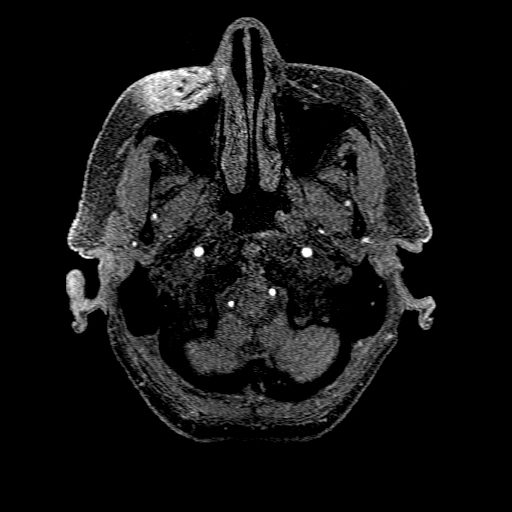
[im 19/176]
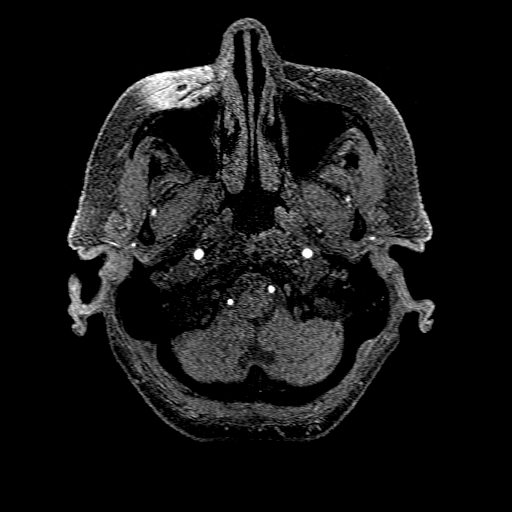
[im 23/176]
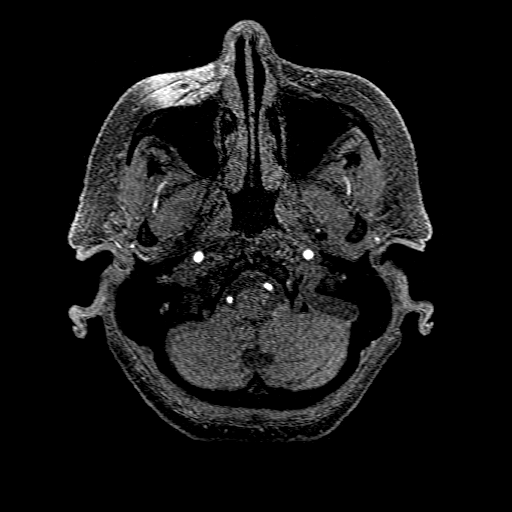
[im 27/176]
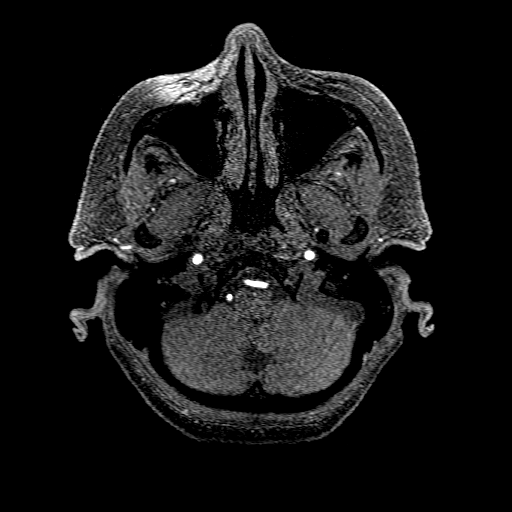
[im 30/176]
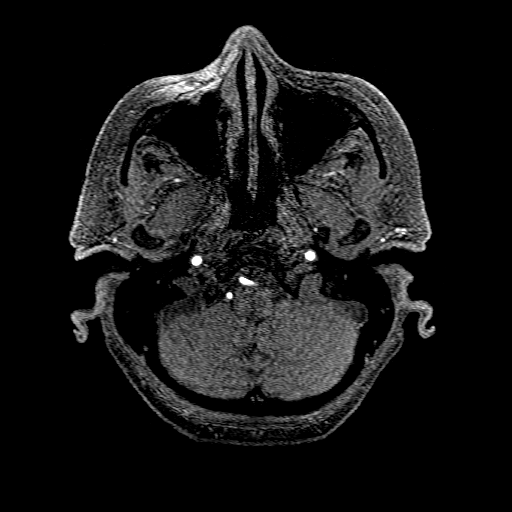
[im 34/176]
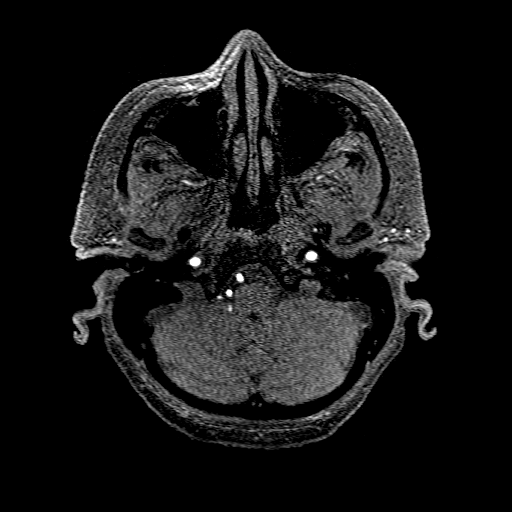
[im 38/176]
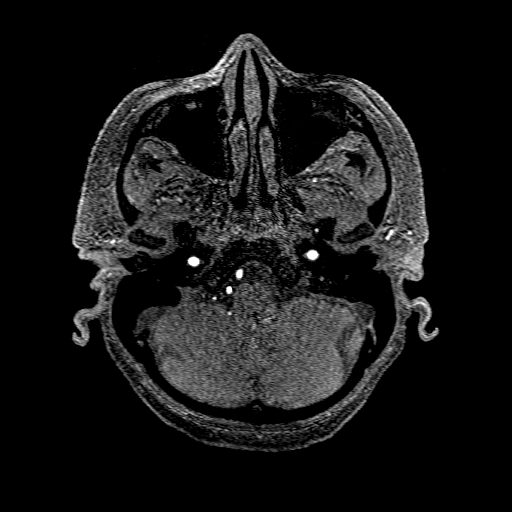
[im 56/176]
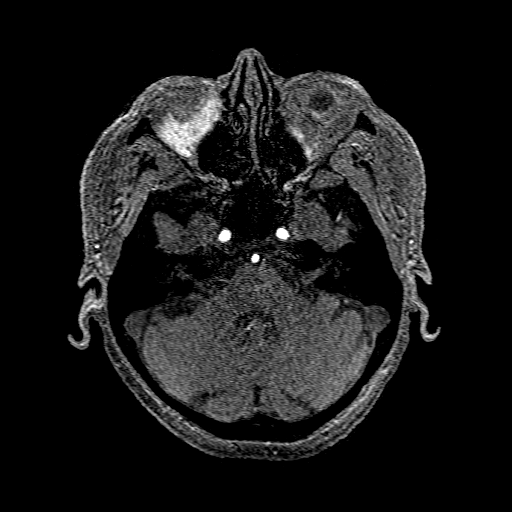
[im 79/176]
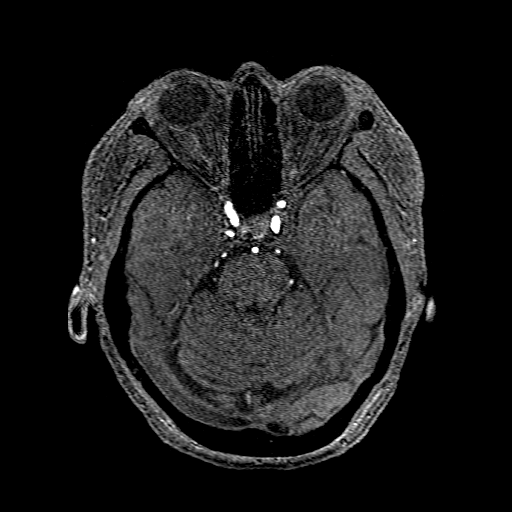
[im 90/176]
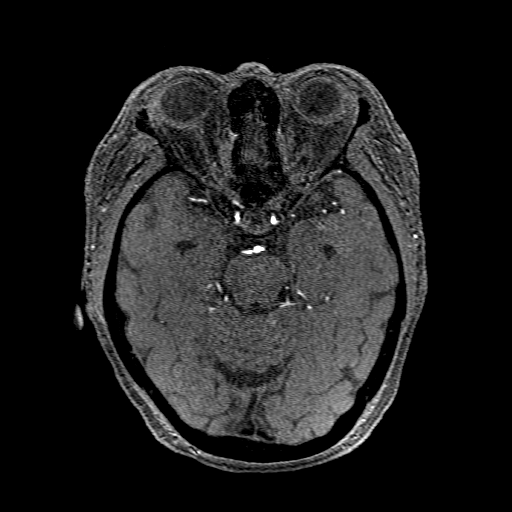
[im 101/176]
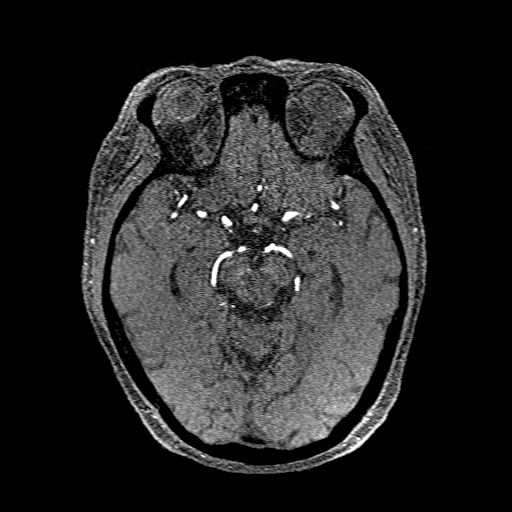
[im 123/176]
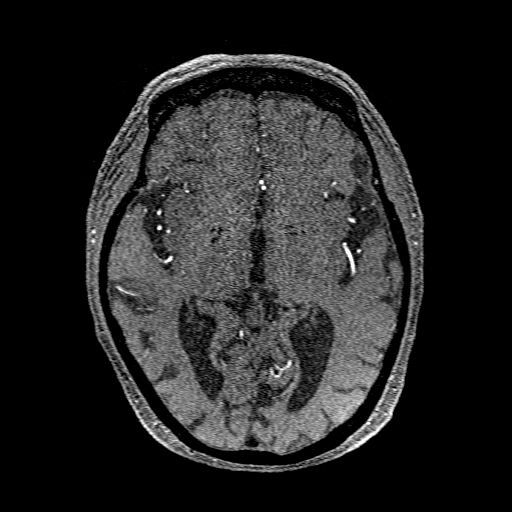
[im 146/176]
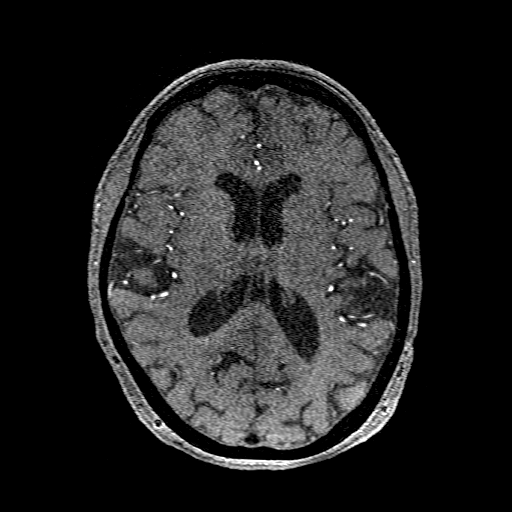
[im 149/176]
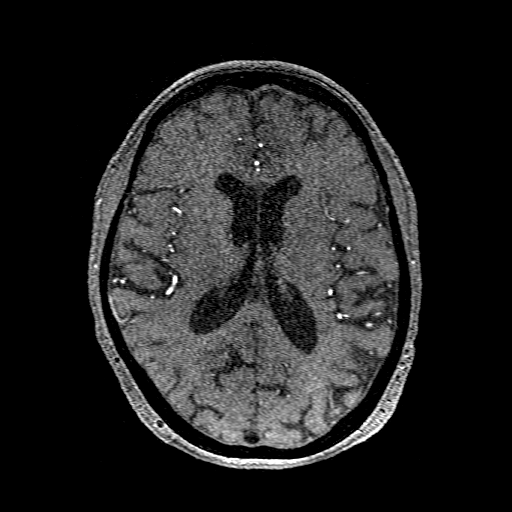
[im 168/176]
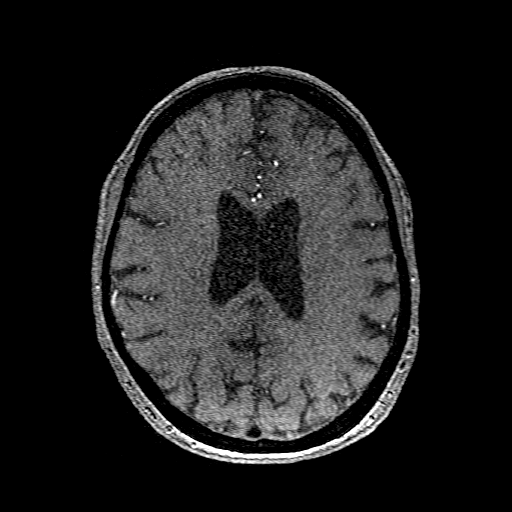

[19 of 48 positions shown; findings below may reference images not displayed]

FINDINGS: No intracranial mass effect or ventriculomegaly.

Codominant distal vertebral arteries with antegrade flow. Patent
basilar artery with no distal vertebral or basilar stenosis. Patent
PICA origins, SCA, and PCA origins. Both posterior communicating
arteries are present. Bilateral PCA branches are within normal
limits.

Antegrade flow in both ICA siphons. Evidence of calcified bilateral
ICA atherosclerosis. This most affects the junctions of the petrous
and cavernous segments and supraclinoid segments with mild to
moderate left supraclinoid stenosis on series 253, image 13. Mild
stenosis otherwise. Posterior communicating artery and right
ophthalmic artery origins are normal. Patent carotid termini, MCA
and ACA origins. Anterior communicating artery and visible ACA
branches are within normal limits. Bilateral MCA M1 segments, MCA
bi/trifurcations, and visible bilateral MCA branches are within
normal limits.
IMPRESSION: 1. Bilateral ICA siphon atherosclerosis. Up to moderate associated
Left ICA supraclinoid stenosis.
2. But otherwise negative intracranial MRA.

## 2020-10-15 MED ORDER — GADOBUTROL 1 MMOL/ML IV SOLN
5.6000 mL | Freq: Once | INTRAVENOUS | Status: AC | PRN
  Administered 2020-10-15: 5.6 mL via INTRAVENOUS

## 2020-10-15 MED ORDER — INFLUENZA VAC A&B SA ADJ QUAD 0.5 ML IM PRSY
0.5000 mL | PREFILLED_SYRINGE | INTRAMUSCULAR | Status: AC
  Administered 2020-10-17: 0.5 mL via INTRAMUSCULAR
  Filled 2020-10-15: qty 0.5

## 2020-10-15 MED ORDER — CLOPIDOGREL BISULFATE 75 MG PO TABS
75.0000 mg | ORAL_TABLET | Freq: Every day | ORAL | Status: DC
  Administered 2020-10-15 – 2020-10-17 (×3): 75 mg via ORAL
  Filled 2020-10-15 (×3): qty 1

## 2020-10-15 NOTE — Progress Notes (Signed)
PROGRESS NOTE    Kendra Walker  GNF:[id number] DOB: [birth year] DOA: 10/14/2020 PCP: Curlene Labrum, MD   Chief Complaint  Patient presents with   Extremity Weakness    R arm   Brief Narrative/Hospital Course: 70 year old female with diabetes, hypertension hyperlipidemia, PAD, hypothyroidism, CKD presented with slurred speech, blurred vision lasting for 2 minutes at 2 PM 10/14/2020 also had right arm numbness right arm weakness that is started earlier, she had a routine eye checkup and was told her that they thought she had a stroke and sent to the ED where she was reporting little bit of right arm weakness but resolution of all of her symptoms.  In the ED CT head negative, MRI "Punctate acute cortical infarct within the posterior left frontal lobe (motor strip). Small chronic cortical infarcts within the left parietooccipital lobes with associated chronic hemosiderin deposition at this site. Mild generalized cerebral atroph' blood pressure as high as 200/71 , neurology was consulted, admitted  Subjective: Seen and examined. No complaints Has remaining mild weakness on RUE  Afebrile, blood pressure in 846N to 629B systolic, on room air  Assessment & Plan:  Small acute stroke in the left motor strip at the region of right upper extremity Chronic left parieto-occipital stroke with right Extremity weakness acute stroke due to ischemia:  Continue on telemetry, appreciate neurology input work-up as below  BP goal : Permissive HTN upto 220/110 mmHg  MRI Brain: Punctate acute cortical infarct as above MRA head and neck pending Echocardiogram pending HgbA1c 8.0, fasting lipid panel pending Prophylactic therapy-ASA 81 mg plus 3 weeks of Plavix .  Normally on aspirin 81 mg at home. High intensity Statin if LDL > 70-continue statin PT OT and speech Frequent neuro checks Stroke swallow screen   CKD stage IIIb Metabolic acidosis : Renal function currently at baseline.  On sodium  bicarb.  Keep on gentle IV fluids Recent Labs  Lab 10/14/20 1226 10/14/20 1325 10/15/20 0554  BUN 71* 45* 44*  CREATININE 1.80* 1.59* 1.78*   Hyperkalemia: Received Lokelma 9/23. Resolved Recent Labs  Lab 10/14/20 1226 10/14/20 1325 10/15/20 0554  K 8.2* 5.4* 4.5     CAD status post CABG PAD s/p bilateral common iliac artery stenting in 2021  HTN  HLD: Blood pressure stable allow permissive hypertension.  No chest pain , no leg pain, distal lower leg perfused.  Normally on aspirin at home.  On statin.    T1DM with stable proliferative diabetic retinopathy on insulin pump A1c poorly controlled 8.0.  Continue to monitor CBG Recent Labs  Lab 10/14/20 2015 10/14/20 2151 10/15/20 0233 10/15/20 0428 10/15/20 0803  GLUCAP 99 98 76 196* 202*     Adrenal insufficiency Continue her fludrocortisone and prednisone.  Hypothyroidism Continue Synthroid  Diet Order             Diet Carb Modified Fluid consistency: Thin; Room service appropriate? Yes  Diet effective now                   DVT prophylaxis: enoxaparin (LOVENOX) injection 30 mg Start: 10/15/20 1000 Code Status:   Code Status: Full Code  Family Communication: plan of care discussed with patient at bedside. Status is: Inpatient  Remains inpatient appropriate because:Ongoing diagnostic testing needed not appropriate for outpatient work up and Inpatient level of care appropriate due to severity of illness Dispo: The patient is from: Home              Anticipated d/c is to:  Home hopefully tomorrow after completing stroke work-up PT OT and neurology clearance              Patient currently is not medically stable to d/c.   Difficult to place patient No Objective: Vitals: Today's Vitals   10/14/20 2333 10/14/20 2356 10/15/20 0427 10/15/20 0802  BP: (!) 157/59  (!) 157/66 (!) 140/51  Pulse: 75 67 74 63  Resp: 13  18 16   Temp: 97.9 F (36.6 C) 97.9 F (36.6 C) 98 F (36.7 C) 97.6 F (36.4 C)  TempSrc:  Oral Oral Oral Oral  SpO2:  96% 97% 96%  Weight:      Height:      PainSc: 0-No pain      Examination: General exam: AA0X3 older than stated age HEENT:Oral mucosa moist, Ear/Nose WNL grossly,dentition normal. Respiratory system: bilaterally clear breath sounds no use of accessory muscle, non tender. Cardiovascular system: S1 & S2 +,No JVD. Gastrointestinal system: Abdomen soft, NT,ND, BS+. Nervous System:Alert, awake, good strength in leg, right upper arm slightly weaker than the left.   Extremities: NO edema, distal peripheral pulses palpable.  Skin: No rashes,no icterus. MSK: Normal muscle bulk,tone, power  No intake or output data in the 24 hours ending 10/15/20 0822 Filed Weights   10/14/20 1040 10/14/20 2135  Weight: 56.7 kg 56.4 kg   Weight change:    Consultants: neurology  Procedures:see note Antimicrobials: Anti-infectives (From admission, onward)    None      Culture/Microbiology No results found for: SDES, SPECREQUEST, CULT, REPTSTATUS  Other culture-see note  Unresulted Labs (From admission, onward)     Start     Ordered   10/16/20 5643  Basic metabolic panel  Daily,   R     Question:  Specimen collection method  Answer:  Lab=Lab collect   10/15/20 0722           Medications reviewed:  Scheduled Meds:  aspirin EC  81 mg Oral Daily   atorvastatin  80 mg Oral QPM   clopidogrel  75 mg Oral Daily   enoxaparin (LOVENOX) injection  30 mg Subcutaneous Q24H   fludrocortisone  0.05 mg Oral Daily   insulin pump   Subcutaneous TID WC, HS, 0200   iron polysaccharides  150 mg Oral QHS   levothyroxine  112 mcg Oral Daily   magnesium chloride  2 tablet Oral QHS   magnesium chloride  1 tablet Oral Daily   predniSONE  5 mg Oral Q breakfast   sodium bicarbonate  650 mg Oral BID   Continuous Infusions:   Intake/Output from previous day: No intake/output data recorded. Intake/Output this shift: No intake/output data recorded. Filed Weights   10/14/20  1040 10/14/20 2135  Weight: 56.7 kg 56.4 kg   Data Reviewed: I have personally reviewed following labs and imaging studies CBC: Recent Labs  Lab 10/14/20 1220 10/14/20 1226  WBC 9.6  --   NEUTROABS 6.5  --   HGB 14.1 15.3*  HCT 43.0 45.0  MCV 95.3  --   PLT 278  --    Basic Metabolic Panel: Recent Labs  Lab 10/14/20 1226 10/14/20 1325 10/15/20 0554  NA 135 137 137  K 8.2* 5.4* 4.5  CL 100 100 101  CO2  --  30 28  GLUCOSE 170* 194* 215*  BUN 71* 45* 44*  CREATININE 1.80* 1.59* 1.78*  CALCIUM  --  9.5 9.3   GFR: Estimated Creatinine Clearance: 23.6 mL/min (A) (by C-G formula based on SCr  of 1.78 mg/dL (H)). Liver Function Tests: Recent Labs  Lab 10/14/20 1325  AST 28  ALT 23  ALKPHOS 55  BILITOT 0.8  PROT 6.5  ALBUMIN 3.8   No results for input(s): LIPASE, AMYLASE in the last 168 hours. No results for input(s): AMMONIA in the last 168 hours. Coagulation Profile: Recent Labs  Lab 10/14/20 1311  INR 0.9   Cardiac Enzymes: No results for input(s): CKTOTAL, CKMB, CKMBINDEX, TROPONINI in the last 168 hours. BNP (last 3 results) No results for input(s): PROBNP in the last 8760 hours. HbA1C: Recent Labs    10/15/20 0554  HGBA1C 8.0*   CBG: Recent Labs  Lab 10/14/20 2015 10/14/20 2151 10/15/20 0233 10/15/20 0428 10/15/20 0803  GLUCAP 99 98 76 196* 202*   Lipid Profile: Recent Labs    10/15/20 0554  CHOL 154  HDL 76  LDLCALC 67  TRIG 55  CHOLHDL 2.0   Thyroid Function Tests: No results for input(s): TSH, T4TOTAL, FREET4, T3FREE, THYROIDAB in the last 72 hours. Anemia Panel: No results for input(s): VITAMINB12, FOLATE, FERRITIN, TIBC, IRON, RETICCTPCT in the last 72 hours. Sepsis Labs: No results for input(s): PROCALCITON, LATICACIDVEN in the last 168 hours.  Recent Results (from the past 240 hour(s))  Resp Panel by RT-PCR (Flu A&B, Covid) Nasopharyngeal Swab     Status: None   Collection Time: 10/14/20 11:15 AM   Specimen:  Nasopharyngeal Swab; Nasopharyngeal(NP) swabs in vial transport medium  Result Value Ref Range Status   SARS Coronavirus 2 by RT PCR NEGATIVE NEGATIVE Final    Comment: (NOTE) SARS-CoV-2 target nucleic acids are NOT DETECTED.  The SARS-CoV-2 RNA is generally detectable in upper respiratory specimens during the acute phase of infection. The lowest concentration of SARS-CoV-2 viral copies this assay can detect is 138 copies/mL. A negative result does not preclude SARS-Cov-2 infection and should not be used as the sole basis for treatment or other patient management decisions. A negative result may occur with  improper specimen collection/handling, submission of specimen other than nasopharyngeal swab, presence of viral mutation(s) within the areas targeted by this assay, and inadequate number of viral copies(<138 copies/mL). A negative result must be combined with clinical observations, patient history, and epidemiological information. The expected result is Negative.  Fact Sheet for Patients:  EntrepreneurPulse.com.au  Fact Sheet for Healthcare Providers:  IncredibleEmployment.be  This test is no t yet approved or cleared by the [place] FDA and  has been authorized for detection and/or diagnosis of SARS-CoV-2 by FDA under an Emergency Use Authorization (EUA). This EUA will remain  in effect (meaning this test can be used) for the duration of the COVID-19 declaration under Section 564(b)(1) of the Act, 21 U.S.C.section 360bbb-3(b)(1), unless the authorization is terminated  or revoked sooner.       Influenza A by PCR NEGATIVE NEGATIVE Final   Influenza B by PCR NEGATIVE NEGATIVE Final    Comment: (NOTE) The Xpert Xpress SARS-CoV-2/FLU/RSV plus assay is intended as an aid in the diagnosis of influenza from Nasopharyngeal swab specimens and should not be used as a sole basis for treatment. Nasal washings and aspirates are unacceptable for  Xpert Xpress SARS-CoV-2/FLU/RSV testing.  Fact Sheet for Patients: EntrepreneurPulse.com.au  Fact Sheet for Healthcare Providers: IncredibleEmployment.be  This test is not yet approved or cleared by the [place] FDA and has been authorized for detection and/or diagnosis of SARS-CoV-2 by FDA under an Emergency Use Authorization (EUA). This EUA will remain in effect (meaning this test can be  used) for the duration of the COVID-19 declaration under Section 564(b)(1) of the Act, 21 U.S.C. section 360bbb-3(b)(1), unless the authorization is terminated or revoked.  Performed at [facility], [address] [place], [place] [address]      Radiology Studies: CT HEAD WO CONTRAST  Result Date: 10/14/2020 CLINICAL DATA:  Slurred speech, right arm numbness EXAM: CT HEAD WITHOUT CONTRAST TECHNIQUE: Contiguous axial images were obtained from the base of the skull through the vertex without intravenous contrast. COMPARISON:  None. FINDINGS: Brain: There is no evidence of acute intracranial hemorrhage, extra-axial fluid collection, or acute infarct. The ventricles are normal in size. There is no mass lesion. There is no midline shift. There is no significant burden of chronic white matter microangiopathy. Vascular: There is dense calcification of the bilateral cavernous ICAs. Skull: Normal. Negative for fracture or focal lesion. Sinuses/Orbits: The imaged paranasal sinuses are clear. Bilateral lens implants are in place. The globes and orbits are otherwise unremarkable. Other: There is a 1.3 cm partially calcified nodule in the right frontoparietal scalp. IMPRESSION: 1. No acute intracranial pathology. 2. 1.3 cm partially calcified in the right frontoparietal scalp is indeterminate. Correlate with physical exam. Electronically Signed   By: Valetta Mole M.D.   On: 10/14/2020 12:13   MR Brain Wo Contrast (neuro protocol)  Result Date: 10/14/2020 CLINICAL DATA:  Neuro  deficit, acute, stroke suspected. Additional history provided: Patient developed slurred speech lasting approximately 40 minutes, right arm numbness, right arm weakness, all now resolved except for persistent right arm weakness. EXAM: MRI HEAD WITHOUT CONTRAST TECHNIQUE: Multiplanar, multiecho pulse sequences of the brain and surrounding structures were obtained without intravenous contrast. COMPARISON:  Head CT 10/14/2020. FINDINGS: Brain: Mild generalized cerebral atrophy. Punctate acute cortical infarct within the posterior left frontal lobe (motor strip) (series 5, image 28) (series 7, image 16). Small chronic cortical infarcts within the left parietooccipital lobes with associated chronic hemosiderin deposition at this site. There are a few additional scattered supratentorial chronic microhemorrhages No evidence of an intracranial mass. No extra-axial fluid collection. No midline shift. Vascular: Maintained flow voids within the proximal large arterial vessels. Skull and upper cervical spine: Focal suspicious marrow lesion. Sinuses/Orbits: Visualized orbits show no acute finding. Bilateral lens replacements. Other: Trace fluid within the left mastoid air cells. Redemonstrated partially calcified right parietal scalp lesion, nonspecific but likely reflecting a cyst. IMPRESSION: Punctate acute cortical infarct within the posterior left frontal lobe (motor strip). Small chronic cortical infarcts within the left parietooccipital lobes with associated chronic hemosiderin deposition at this site. Mild generalized cerebral atrophy. Trace fluid within the left mastoid air cells. Electronically Signed   By: Kellie Simmering D.O.   On: 10/14/2020 14:52     LOS: 1 day   Antonieta Pert, MD [facility]  10/15/2020, 8:22 AM

## 2020-10-15 NOTE — Evaluation (Signed)
Speech Language Pathology Evaluation Patient Details Name: Kendra Walker MRN: [id number] DOB: 03/01/1950 Today's Date: 10/15/2020 Time: 9678-9381 SLP Time Calculation (min) (ACUTE ONLY): 15 min  Problem List:  Patient Active Problem List   Diagnosis Date Noted   Acute stroke due to ischemia ([place]) 10/14/2020   Type 1 diabetes mellitus with stable proliferative diabetic retinopathy, bilateral ([place]) 10/13/2019   Advanced nonexudative age-related macular degeneration of both eyes without subfoveal involvement 10/13/2019   Bilateral epiretinal membrane 10/13/2019   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 choroidal neovascularization ([place]) 10/13/2019   Groin hematoma 06/09/2019   Colon cancer screening 09/16/2017   Severe diabetic hypoglycemia ([place]) 01/75/1025   Metabolic acidosis 85/27/7824   Elevated troponin 03/07/2016   Pneumonia 03/07/2016   Septic shock ([place]) 03/07/2016   CAD (coronary artery disease) 11/11/2013   HTN (hypertension) 11/11/2013   PAD (peripheral artery disease) ([place]) 11/11/2013   Hyperlipidemia 11/11/2013   Pyuria 05/12/2013   Hypomagnesemia 03/03/2012   Hypothyroidism 01/28/2012   Anemia 09/27/2011   Adrenal insufficiency (Addison's disease) ([place]) 08/31/2011   Wound, open, leg 07/19/2011   Chronic kidney disease, stage III (moderate) ([place]) 05/14/2011   Diabetic retinopathy ([place]) 11/27/2010   History of two vessel coronary artery bypass graft 11/27/2010   Hyperkalemia 11/27/2010   Iron deficiency 11/27/2010   Past Medical History:  Past Medical History:  Diagnosis Date   Cancer ([place])    Squamous Cell   Diabetes mellitus without complication ([place])    Hyperlipemia    Hypertension    Hypothyroidism    PAD (peripheral artery disease) ([place])    Past Surgical History:  Past Surgical History:  Procedure Laterality Date   ABDOMINAL AORTOGRAM W/LOWER EXTREMITY N/A 06/09/2019   Procedure: ABDOMINAL AORTOGRAM W/LOWER EXTREMITY;   Surgeon: Serafina Mitchell, MD;  Location: [facility];  Service: Cardiovascular;  Laterality: N/A;   APPENDECTOMY     CATARACT EXTRACTION Bilateral    CORONARY ARTERY BYPASS GRAFT     PERIPHERAL VASCULAR INTERVENTION Bilateral 06/09/2019   Procedure: PERIPHERAL VASCULAR INTERVENTION;  Surgeon: Serafina Mitchell, MD;  Location: [facility];  Service: Cardiovascular;  Laterality: Bilateral;   TONSILLECTOMY     YAG LASER APPLICATION Right 2/35/3614   Procedure: YAG LASER APPLICATION;  Surgeon: Williams Che, MD;  Location: [facility];  Service: Ophthalmology;  Laterality: Right;   HPI:  Kendra Walker is a 70 y.o. female who presented to the ED with slurred speech and RUE weakness. MRI (+) for Punctate acute cortical infarct within the posterior left frontal lobe (motor strip). Small chronic cortical infarcts within the left parietooccipital lobes with associated chronic hemosiderin deposition at this site. PMHx: BM, hyperlipidemia, hypertension, hypothyroidism, PAD, cancer, macular degeneraltion dabetic retinopathy   Assessment / Plan / Recommendation Clinical Impression  Pt presents with resolved dysarthria.  Speech is clear and fluent.  Expressive and receptive language processes are WNL per assessment.  Awareness, basic problem solving and short term memory are intact.  No SLP f/u is needed.  Our service will sign off. Pt/husband agree with plan.    SLP Assessment  SLP Recommendation/Assessment: Patient does not need any further Speech [facility] SLP Visit Diagnosis: Cognitive communication deficit (R41.841)    Recommendations for follow up therapy are one component of a multi-disciplinary discharge planning process, led by the attending physician.  Recommendations may be updated based on patient status, additional functional criteria and insurance authorization.    Follow Up Recommendations  None  Frequency and Duration           SLP  Evaluation Cognition  Overall Cognitive Status: Within Functional Limits for tasks assessed Arousal/Alertness: Awake/alert Orientation Level: Oriented X4 Attention: Selective Selective Attention: Appears intact Memory: Appears intact Awareness: Appears intact       Comprehension  Auditory Comprehension Overall Auditory Comprehension: Appears within functional limits for tasks assessed Yes/No Questions: Within Functional Limits Commands: Within Functional Limits Visual Recognition/Discrimination Discrimination: Within Function Limits Reading Comprehension Reading Status: Within funtional limits    Expression Expression Primary Mode of Expression: Verbal Verbal Expression Overall Verbal Expression: Appears within functional limits for tasks assessed Level of Generative/Spontaneous Verbalization: Conversation Repetition: No impairment Naming: No impairment Pragmatics: No impairment Written Expression Dominant Hand: Right   Oral / Motor  Oral Motor/Sensory Function Overall Oral Motor/Sensory Function: Within functional limits Motor Speech Overall Motor Speech: Appears within functional limits for tasks assessed   GO                    Gabe Glace L. Tivis Ringer, [place] Office number [phone] Pager [phone]  Assunta Curtis 10/15/2020, 11:33 AM

## 2020-10-15 NOTE — Evaluation (Signed)
Physical Therapy Evaluation Patient Details Name: FABIANNA KEATS MRN: [id number] DOB: 02-May-1950 Today's Date: 10/15/2020  History of Present Illness  BREANAH FADDIS is a 70 y.o. female who presented to the ED with slurred speech and RUE weakness. MRI (+) for Punctate acute cortical infarct within the posterior left frontal lobe (motor strip). Small chronic cortical infarcts within the left parietooccipital lobes with associated chronic hemosiderin deposition at this site. PMHx: BM, hyperlipidemia, hypertension, hypothyroidism, PAD, cancer, macular degeneraltion dabetic retinopathy   Clinical Impression  PT eval complete. Pt mod I bed mobility and transfers. Supervision provide for ambulation 200' with SPC. No LOB noted. Pt at baseline level for mobility. No further skilled PT intervention indicated. PT signing off.       Recommendations for follow up therapy are one component of a multi-disciplinary discharge planning process, led by the attending physician.  Recommendations may be updated based on patient status, additional functional criteria and insurance authorization.  Follow Up Recommendations No PT follow up    Equipment Recommendations  None recommended by PT    Recommendations for Other Services       Precautions / Restrictions Precautions Precautions: Fall Restrictions Weight Bearing Restrictions: No      Mobility  Bed Mobility Overal bed mobility: Modified Independent                  Transfers Overall transfer level: Modified independent Equipment used: Straight cane             General transfer comment: No LOB during room ambulation with turns and come challenge  Ambulation/Gait Ambulation/Gait assistance: Supervision Gait Distance (Feet): 200 Feet Assistive device: Straight cane Gait Pattern/deviations: Step-through pattern;Decreased stride length Gait velocity: WFL Gait velocity interpretation: 1.31 - 2.62 ft/sec, indicative of limited  community ambulator General Gait Details: ambulates in wedge sandals due to R plantar flexion contracture. Steady gait with SPC. No LOB noted.  Stairs            Wheelchair Mobility    Modified Rankin (Stroke Patients Only) Modified Rankin (Stroke Patients Only) Pre-Morbid Rankin Score: No significant disability Modified Rankin: Slight disability     Balance Overall balance assessment: No apparent balance deficits (not formally assessed)                                           Pertinent Vitals/Pain Pain Assessment: No/denies pain    Home Living Family/patient expects to be discharged to:: Private residence Living Arrangements: Spouse/significant other Available Help at Discharge: Available 24 hours/day Type of Home: House Home Access: Ramped entrance     Home Layout: One level Home Equipment: Environmental consultant - 2 wheels;Cane - single point;Shower seat;Bedside commode;Grab bars - tub/shower Additional Comments: pt and her husband are primary care givers to pt's mother    Prior Function Level of Independence: Independent         Comments: does not drive     Hand Dominance   Dominant Hand: Right    Extremity/Trunk Assessment   Upper Extremity Assessment Upper Extremity Assessment: Defer to OT evaluation RUE Deficits / Details: Generally 4/5 MMT, ROM is WFL, impaired fine motor for thumb to digit task. WFL for hand writing RUE Sensation: WNL (pt reports slightly deminished sensation of 4th and 5th digits due to carpal tunel sx) RUE Coordination: decreased fine motor    Lower Extremity Assessment Lower Extremity Assessment: RLE  deficits/detail RLE Deficits / Details: plantar flexion contracture    Cervical / Trunk Assessment Cervical / Trunk Assessment: Normal  Communication   Communication: No difficulties  Cognition Arousal/Alertness: Awake/alert Behavior During Therapy: WFL for tasks assessed/performed;Flat affect Overall Cognitive  Status: Within Functional Limits for tasks assessed                                        General Comments General comments (skin integrity, edema, etc.): VSS on RA, educated pt on BE FAST    Exercises     Assessment/Plan    PT Assessment Patent does not need any further PT services  PT Problem List         PT Treatment Interventions      PT Goals (Current goals can be found in the Care Plan section)  Acute Rehab PT Goals Patient Stated Goal: home soon PT Goal Formulation: All assessment and education complete, DC therapy    Frequency     Barriers to discharge        Co-evaluation               AM-PAC PT "6 Clicks" Mobility  Outcome Measure Help needed turning from your back to your side while in a flat bed without using bedrails?: None Help needed moving from lying on your back to sitting on the side of a flat bed without using bedrails?: None Help needed moving to and from a bed to a chair (including a wheelchair)?: None Help needed standing up from a chair using your arms (e.g., wheelchair or bedside chair)?: None Help needed to walk in hospital room?: A Little Help needed climbing 3-5 steps with a railing? : A Little 6 Click Score: 22    End of Session Equipment Utilized During Treatment: Gait belt Activity Tolerance: Patient tolerated treatment well Patient left: in chair;with call bell/phone within reach Nurse Communication: Mobility status PT Visit Diagnosis: Difficulty in walking, not elsewhere classified (R26.2)    Time: 7858-8502 PT Time Calculation (min) (ACUTE ONLY): 11 min   Charges:   PT Evaluation $PT Eval Low Complexity: 1 Low          Lorrin Goodell, PT  Office # [phone] Pager [phone]   Lorriane Shire 10/15/2020, 9:50 AM

## 2020-10-15 NOTE — Progress Notes (Addendum)
STROKE TEAM PROGRESS NOTE   INTERVAL HISTORY No one at bedside with her this am. MRI results d/w pt and educated on stroke s/sx and when to call 911. Also educated and reviewed her risk factors and stroke work up and plan. She presented with transient slurred speech and right upper extremity weakness and numbness which appears to have resolved.  MRI scan shows tiny left frontal motor strip cortical embolic infarct.  Neurovascular imaging studies have not yet been done.  Echocardiogram is pending.  LDL cholesterol 67 mg percent and hemoglobin A1c is 8.0.  Urine drug screen is negative Vitals:   10/14/20 2333 10/14/20 2356 10/15/20 0427 10/15/20 0802  BP: (!) 157/59  (!) 157/66 (!) 140/51  Pulse: 75 67 74 63  Resp: 13  18 16   Temp: 97.9 F (36.6 C) 97.9 F (36.6 C) 98 F (36.7 C) 97.6 F (36.4 C)  TempSrc: Oral Oral Oral Oral  SpO2:  96% 97% 96%  Weight:      Height:       CBC:  Recent Labs  Lab 10/14/20 1220 10/14/20 1226  WBC 9.6  --   NEUTROABS 6.5  --   HGB 14.1 15.3*  HCT 43.0 45.0  MCV 95.3  --   PLT 278  --    Basic Metabolic Panel:  Recent Labs  Lab 10/14/20 1325 10/15/20 0554  NA 137 137  K 5.4* 4.5  CL 100 101  CO2 30 28  GLUCOSE 194* 215*  BUN 45* 44*  CREATININE 1.59* 1.78*  CALCIUM 9.5 9.3   Lipid Panel:  Recent Labs  Lab 10/15/20 0554  CHOL 154  TRIG 55  HDL 76  CHOLHDL 2.0  VLDL 11  LDLCALC 67   HgbA1c:  Recent Labs  Lab 10/15/20 0554  HGBA1C 8.0*   Urine Drug Screen:  Recent Labs  Lab 10/14/20 1405  LABOPIA NONE DETECTED  COCAINSCRNUR NONE DETECTED  LABBENZ NONE DETECTED  AMPHETMU NONE DETECTED  THCU NONE DETECTED  LABBARB NONE DETECTED    Alcohol Level  Recent Labs  Lab 10/14/20 1311  ETH <10    IMAGING past 24 hours CT HEAD WO CONTRAST  Result Date: 10/14/2020 CLINICAL DATA:  Slurred speech, right arm numbness EXAM: CT HEAD WITHOUT CONTRAST TECHNIQUE: Contiguous axial images were obtained from the base of the skull  through the vertex without intravenous contrast. COMPARISON:  None. FINDINGS: Brain: There is no evidence of acute intracranial hemorrhage, extra-axial fluid collection, or acute infarct. The ventricles are normal in size. There is no mass lesion. There is no midline shift. There is no significant burden of chronic white matter microangiopathy. Vascular: There is dense calcification of the bilateral cavernous ICAs. Skull: Normal. Negative for fracture or focal lesion. Sinuses/Orbits: The imaged paranasal sinuses are clear. Bilateral lens implants are in place. The globes and orbits are otherwise unremarkable. Other: There is a 1.3 cm partially calcified nodule in the right frontoparietal scalp. IMPRESSION: 1. No acute intracranial pathology. 2. 1.3 cm partially calcified in the right frontoparietal scalp is indeterminate. Correlate with physical exam. Electronically Signed   By: Valetta Mole M.D.   On: 10/14/2020 12:13   MR Brain Wo Contrast (neuro protocol)  Result Date: 10/14/2020 CLINICAL DATA:  Neuro deficit, acute, stroke suspected. Additional history provided: Patient developed slurred speech lasting approximately 40 minutes, right arm numbness, right arm weakness, all now resolved except for persistent right arm weakness. EXAM: MRI HEAD WITHOUT CONTRAST TECHNIQUE: Multiplanar, multiecho pulse sequences of the brain and  surrounding structures were obtained without intravenous contrast. COMPARISON:  Head CT 10/14/2020. FINDINGS: Brain: Mild generalized cerebral atrophy. Punctate acute cortical infarct within the posterior left frontal lobe (motor strip) (series 5, image 28) (series 7, image 16). Small chronic cortical infarcts within the left parietooccipital lobes with associated chronic hemosiderin deposition at this site. There are a few additional scattered supratentorial chronic microhemorrhages No evidence of an intracranial mass. No extra-axial fluid collection. No midline shift. Vascular: Maintained  flow voids within the proximal large arterial vessels. Skull and upper cervical spine: Focal suspicious marrow lesion. Sinuses/Orbits: Visualized orbits show no acute finding. Bilateral lens replacements. Other: Trace fluid within the left mastoid air cells. Redemonstrated partially calcified right parietal scalp lesion, nonspecific but likely reflecting a cyst. IMPRESSION: Punctate acute cortical infarct within the posterior left frontal lobe (motor strip). Small chronic cortical infarcts within the left parietooccipital lobes with associated chronic hemosiderin deposition at this site. Mild generalized cerebral atrophy. Trace fluid within the left mastoid air cells. Electronically Signed   By: Kellie Simmering D.O.   On: 10/14/2020 14:52    PHYSICAL EXAM Constitutional: Appears well-developed and well-nourished.  Middle-aged Caucasian lady Psych: Affect appropriate to situation Eyes: No scleral injection HENT: No OP obstruction. Head: Normocephalic.  Cardiovascular: Normal rate and regular rhythm.  Respiratory: Effort normal, symmetric excursions bilaterally, no audible wheezing. GI: Soft.  No distension. There is no tenderness.  Skin: WDI, poorly healed indented large area of scar tissue on top of inferior ankle.   NEURO: Mental Status: Patient is awake, alert, oriented to person, place, month, year, and situation. Patient is able to give a clear and coherent history. Speech fluent, intact comprehension and repetition. No signs of aphasia or neglect. Visual Fields are full. Pupils are equal, round, and reactive to light. EOMI without ptosis or diploplia. Poor baseline vision Facial sensation is symmetric to temperature Facial movement is symmetric.  Hearing is intact to voice. Uvula midline and palate elevates symmetrically. Shoulder shrug is symmetric. Tongue is midline without atrophy or fasciculations.  Tone is normal. Bulk is normal. 5/5 strength was present in all four extremities. Her  right ankle is frozen and contracted from an old injury/fracture and with decreased ROM and unable to push up or down. Sensation is symmetric to light touch and temperature in the arms and legs. Deep Tendon Reflexes: decreased, but symmetric in the biceps and patellae. FNF and HKS are intact bilaterally. Gait - Deferred  ASSESSMENT/PLAN [name] is a 70 y.o. female with history of prev stroke, HLD, HTN, DM2, PAD presenting with Right UE weakness, now resolved and pt reports feeling back to baseline. No acute stroke treatments d/t outside of time window and no LVO.   Stroke:  Small left cortical infarct embolic appearing. Work up underway for etiology. May need Loop if none found.  CT head No acute abnormality. MRI  tiny Left cortical infarct MRA  head- pending Carotid Doppler  pending 2D Echo pending LDL 67 HgbA1c 8.0 VTE prophylaxis - lovenox    Diet   Diet Carb Modified Fluid consistency: Thin; Room service appropriate? Yes   none  prior to admission, now on aspirin 81 mg daily and clopidogrel 75 mg daily. Recommend DAPT for 3 weeks, then ASA alone, unless Afib is detected, then Eliquis alone would be recommendation. Therapy recommendations:  pending Disposition:  pending  Hypertension Home meds:  none Stable Permissive hypertension (OK if < 220/120) but gradually normalize in 5-7 days Long-term BP goal normotensive  Hyperlipidemia Home meds:  Lipitor 80mg  daily, resumed in hospital LDL 67, goal < 70 High intensity statin  Continue statin at discharge  Diabetes type II Uncontrolled Home meds:  Insulin pump HgbA1c 8.0, goal < 7.0 CBGs Recent Labs    10/15/20 0233 10/15/20 0428 10/15/20 0803  GLUCAP 76 196* 202*    SSI  Other Stroke Risk Factors Advanced Age >/= 17  Hx stroke/TIA PAD  Other Active Problems Low vision Left eye secondary to diabetic retinopathy  Hospital day # [place], ARNP-C, ANVP-BC Pager: [phone]    STROKE MD NOTE :I have personally obtained history,examined this patient, reviewed notes, independently viewed imaging studies, participated in medical decision making and plan of care.ROS completed by me personally and pertinent positives fully documented  I have made any additions or clarifications directly to the above note. Agree with note above.  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  Recommend check MR angiogram of the brain and carotid ultrasound as well as echocardiogram.  Dual antiplatelet therapy for 3 weeks.  Aggressive risk factor modification.  May need prolonged cardiac monitoring at discharge for paroxysmal A. fib.  Discussed with patient and Dr. Maren Beach.  Greater than 50% time during this 35-minute visit was spent on counseling and coordination of care and discussion with care team and answering questions  Antony Contras, MD Medical Director [place] Pager: [phone] 10/15/2020 1:38 PM  To contact Stroke Continuity provider, please refer to [email or web address] After hours, contact General Neurology

## 2020-10-15 NOTE — Evaluation (Signed)
Occupational Therapy Evaluation Patient Details Name: Kendra Walker MRN: [id number] DOB: 1950-05-03 Today's Date: 10/15/2020   History of Present Illness Kendra Walker is a 70 y.o. female who presented to the ED with slurred speech and RUE weakness. MRI (+) for Punctate acute cortical infarct within the posterior left frontal lobe (motor strip). Small chronic cortical infarcts within the left parietooccipital lobes with associated chronic hemosiderin deposition at this site. PMHx: BM, hyperlipidemia, hypertension, hypothyroidism, PAD, cancer, macular degeneraltion dabetic retinopathy   Clinical Impression   Kendra Walker was mod I with all mobility and Adls prior to the above admission. She lives in a 1 level home with a ramped entrance with her husband and her mother.  Upon evaluation pt was mod I for bed mobility and functional room distance ambulation, rooming at the sink and most ADLs with SPC. She demonstrated good safety awareness. 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 Pt verbalized understanding of BE FAST, and what to do if she is experiencing any stroke-like symptoms. Pt does not require further OT acutely. Recommend d/c home with supervision initially for all ADLs an mobility.    Recommendations for follow up therapy are one component of a multi-disciplinary discharge planning process, led by the attending physician.  Recommendations may be updated based on patient status, additional functional criteria and insurance authorization.   Follow Up Recommendations  No OT follow up;Supervision - Intermittent    Equipment Recommendations  None recommended by OT       Precautions / Restrictions Precautions Precautions: Fall Restrictions Weight Bearing Restrictions: No      Mobility Bed Mobility Overal bed mobility: Modified Independent                  Transfers Overall transfer level: Modified independent Equipment used: Straight cane             General  transfer comment: No LOB during room ambulation with turns and come challenge    Balance Overall balance assessment: No apparent balance deficits (not formally assessed)                                         ADL either performed or assessed with clinical judgement   ADL Overall ADL's : Needs assistance/impaired Eating/Feeding: Independent;Sitting   Grooming: Oral care;Standing;Modified independent   Upper Body Bathing: Supervision/ safety;Sitting Upper Body Bathing Details (indicate cue type and reason): educated pt to sit while showering upon d/c home to incrased safety, decrease fall risk Lower Body Bathing: Supervison/ safety;Sit to/from stand   Upper Body Dressing : Modified independent;Sitting   Lower Body Dressing: Supervision/safety;Sit to/from stand   Toilet Transfer: Modified Independent;Ambulation Toilet Transfer Details (indicate cue type and reason): with [facility] Toileting- Clothing Manipulation and Hygiene: Modified independent   Tub/ Shower Transfer: Supervision/safety;Ambulation   Functional mobility during ADLs: Modified independent (SPC) General ADL Comments: supervision given for safety only - pt generally mod I for all ADL tasks and very close to baseline despite mild RUE weakness     Vision Baseline Vision/History: 1 Wears glasses Ability to See in Adequate Light: 0 Adequate Patient Visual Report: No change from baseline Vision Assessment?: No apparent visual deficits Additional Comments: Pt's vision is limited in R eye at baseline     Perception Perception Perception Tested?: No   Praxis      Pertinent Vitals/Pain Pain Assessment: No/denies pain  Hand Dominance Right   Extremity/Trunk Assessment Upper Extremity Assessment Upper Extremity Assessment: Defer to OT evaluation RUE Deficits / Details: Generally 4/5 MMT, ROM is WFL, impaired fine motor for thumb to digit task. WFL for hand writing RUE Sensation: WNL (pt reports  slightly deminished sensation of 4th and 5th digits due to carpal tunel sx) RUE Coordination: decreased fine motor   Lower Extremity Assessment Lower Extremity Assessment: RLE deficits/detail RLE Deficits / Details: plantar flexion contracture   Cervical / Trunk Assessment Cervical / Trunk Assessment: Normal   Communication Communication Communication: No difficulties   Cognition Arousal/Alertness: Awake/alert Behavior During Therapy: WFL for tasks assessed/performed;Flat affect Overall Cognitive Status: Within Functional Limits for tasks assessed                                     General Comments  VSS on RA, educated pt on BE FAST    Exercises     Shoulder Instructions      Home Living Family/patient expects to be discharged to:: Private residence Living Arrangements: Spouse/significant other Available Help at Discharge: Available 24 hours/day Type of Home: House Home Access: Ramped entrance     Home Layout: One level     Bathroom Shower/Tub: Walk-in shower;Door   [employer] Toilet: Handicapped height Bathroom Accessibility: Yes How Accessible: Accessible via walker Home Equipment: [place] - 2 wheels;Cane - single point;Shower seat;Bedside commode;Grab bars - tub/shower   Additional Comments: pt and her husband are primary care givers to pt's mother      Prior Functioning/Environment Level of Independence: Independent        Comments: does not drive        OT Problem List: Decreased strength;Decreased range of motion;Decreased activity tolerance;Impaired balance (sitting and/or standing);Decreased safety awareness;Decreased knowledge of use of DME or AE;Decreased knowledge of precautions;Pain      OT Treatment/Interventions:      OT Goals(Current goals can be found in the care plan section) Acute Rehab OT Goals Patient Stated Goal: home soon OT Goal Formulation: All assessment and education complete, DC therapy  OT Frequency:      Barriers to D/C:            Co-evaluation              AM-PAC OT "6 Clicks" Daily Activity     Outcome Measure Help from another person eating meals?: None Help from another person taking care of personal grooming?: None Help from another person toileting, which includes using toliet, bedpan, or urinal?: None Help from another person bathing (including washing, rinsing, drying)?: A Little Help from another person to put on and taking off regular upper body clothing?: None Help from another person to put on and taking off regular lower body clothing?: None 6 Click Score: 23   End of Session Equipment Utilized During Treatment: Other (comment) [facility]) Nurse Communication: Mobility status  Activity Tolerance: Patient tolerated treatment well Patient left: in chair;with call bell/phone within reach  OT Visit Diagnosis: Unsteadiness on feet (R26.81);Other abnormalities of gait and mobility (R26.89);Muscle weakness (generalized) (M62.81);Pain;Hemiplegia and hemiparesis Hemiplegia - Right/Left: Right Hemiplegia - dominant/non-dominant: Dominant Hemiplegia - caused by: Cerebral infarction                Time: 9379-0240 OT Time Calculation (min): 23 min Charges:  OT General Charges $OT Visit: 1 Visit OT Evaluation $OT Eval Moderate Complexity: 1 Mod OT Treatments $Self Care/Home Management :  8-22 mins    Tami Barren A Fusaye Wachtel 10/15/2020, 10:26 AM

## 2020-10-15 NOTE — Consult Note (Signed)
Neurology consult H&P  Kendra Walker MR# [id number] 10/15/2020  CC: acute stroke  History is obtained from: patient and chart  HPI: Kendra Walker is a 70 y.o. female PMHx as reviewed below developed blurred vision and slurred speech ~1400 on 10/15/2020 that lasted ~40 minutes which was followed by right arm numbness and weakness however, she was not concerned. She later had a routine eye exam and was told she may have had a stroke and advised to proceed to the ED.    At baseline has decreased vision in left eye due to diabetic retinopathy. Denies changes in vision, hearing, headache, cp, n/v/f/diarrhea.  LKW: 1400 10/14/2020 tNK given: No OSW IR Thrombectomy No Modified Rankin Scale: 0-Completely asymptomatic and back to baseline post- stroke NIHSS: 0  ROS: A complete ROS was performed and is negative except as noted in the HPI.   Past Medical History:  Diagnosis Date   Cancer ([place])    Squamous Cell   Diabetes mellitus without complication ([place])    Hyperlipemia    Hypertension    Hypothyroidism    PAD (peripheral artery disease) ([place])      Family History  Problem Relation Age of Onset   Heart disease Mother    Hypertension Mother    Hypertension Father     Social History:  reports that she has never smoked. She has never used smokeless tobacco. She reports that she does not drink alcohol and does not use drugs.   Prior to Admission medications   Medication Sig Start Date End Date Taking? Authorizing Provider  acetaminophen (TYLENOL) 500 MG tablet Take 500 mg by mouth every 6 (six) hours as needed for mild pain or headache.    Yes [provider]  Ascorbic Acid (VITAMIN C WITH ROSE HIPS) 500 MG tablet Take 500 mg by mouth at bedtime.   Yes [provider]  aspirin EC 81 MG tablet Take 81 mg by mouth daily.   Yes [provider]  atorvastatin (LIPITOR) 80 MG tablet Take 1 tablet (80 mg total) by mouth daily. Patient taking differently: Take  80 mg by mouth every evening. 11/11/13  Yes BranchAlphonse Guild, MD  Calcium Carbonate (CALCIUM 600 PO) Take 600 mg by mouth in the morning and at bedtime.   Yes [provider]  Cholecalciferol (VITAMIN D3) 50 MCG (2000 UT) TABS Take 2,000 Units by mouth daily.   Yes [provider]  fludrocortisone (FLORINEF) 0.1 MG tablet Take 0.05 mg by mouth daily. 10/21/13  Yes [provider]  HUMALOG 100 UNIT/ML injection Inject into the skin See admin instructions. VIA INSULIN PUMP 07/21/15  Yes [provider]  Insulin Human (INSULIN PUMP) SOLN Inject into the skin. HUMALOG 100 UNIT/ML   Yes [provider]  iron polysaccharides (NIFEREX) 150 MG capsule Take 150 mg by mouth at bedtime.    Yes [provider]  levothyroxine (SYNTHROID) 112 MCG tablet Take 112 mcg by mouth daily. 12/09/19  Yes [provider]  magnesium chloride (SLOW-MAG) 64 MG TBEC SR tablet Take 1-2 tablets by mouth See admin instructions. TAKE 1 TABLET IN THE MORNING & TAKE 2 TABLETS AT NIGHT.   Yes [provider]  Multiple Vitamin (MULTIVITAMIN WITH MINERALS) TABS tablet Take 1 tablet by mouth daily.   Yes [provider]  Multiple Vitamins-Minerals (PRESERVISION AREDS 2 PO) Take 1 tablet by mouth daily.    Yes [provider]  Omega-3 Fatty Acids (FISH OIL) 1200 MG CAPS Take  1,200 mg by mouth daily.   Yes [provider]  POLY-IRON 150 FORTE 150-25-1 MG-MCG-MG CAPS Take 1 capsule by mouth daily at 6 (six) AM. 02/14/20  Yes [provider]  predniSONE (DELTASONE) 5 MG tablet Take 5 mg by mouth daily with breakfast.   Yes [provider]  sodium bicarbonate 650 MG tablet Take 650 mg by mouth in the morning and at bedtime.   Yes [provider]  GLUCAGEN HYPOKIT 1 MG SOLR injection Inject into the muscle. 04/25/20   [provider]    Exam: Current vital signs: BP (!) 157/59 (BP Location: Left Arm)   Pulse  67   Temp 97.9 F (36.6 C) (Oral)   Resp 13   Ht 5\' 2"  (1.575 m)   Wt 56.4 kg   SpO2 96%   BMI 22.74 kg/m   Physical Exam  Constitutional: Appears well-developed and well-nourished.  Psych: Affect appropriate to situation Eyes: No scleral injection HENT: No OP obstruction. Head: Normocephalic.  Cardiovascular: Normal rate and regular rhythm.  Respiratory: Effort normal, symmetric excursions bilaterally, no audible wheezing. GI: Soft.  No distension. There is no tenderness.  Skin: WDI  Neuro: Mental Status: Patient is awake, alert, oriented to person, place, month, year, and situation. Patient is able to give a clear and coherent history. Speech fluent, intact comprehension and repetition. No signs of aphasia or neglect. Visual Fields are full. Pupils are equal, round, and reactive to light. EOMI without ptosis or diploplia.  Facial sensation is symmetric to temperature Facial movement is symmetric.  Hearing is intact to voice. Uvula midline and palate elevates symmetrically. Shoulder shrug is symmetric. Tongue is midline without atrophy or fasciculations.  Tone is normal. Bulk is normal. 5/5 strength was present in all four extremities. Sensation is symmetric to light touch and temperature in the arms and legs. Deep Tendon Reflexes: 2+ and symmetric in the biceps and patellae. Toes are downgoing bilaterally. FNF and HKS are intact bilaterally. Gait - Deferred  I have reviewed labs in epic and the pertinent results are: K 5.4  I have reviewed the images obtained: MRI brain showed punctate acute cortical infarct within the posterior left frontal lobe (motor strip) and small chronic cortical infarcts within the left parietooccipital lobes with associated chronic hemosiderin deposition at this site.   Assessment: Kendra Walker is a 70 y.o. female PMHx as above, chronic left parietoccipital stroke with right upper extremity weakness found to have small acute stroke in  left motor strip in the region of right upper extremity. She states that back to baseline however she has vascular risk factors and will need further stroke workup.  Impression:  Acute ischemic stroke Chronic ischemic stroke HLD HTN DM 2 PAD  Plan: - MRA head and neck - Pending. - Recommend TTE. - Recommend labs: HbA1c, lipid panel,. - Recommend Statin for goal LDL<70.  - Continue aspirin 81mg  daily. - Clopidogrel 75mg  daily for 3 weeks. - Blood pressure goal <130/90.  - Telemetry monitoring for arrhythmia. - Recommend bedside Swallow screen. - Recommend Stroke education. - Recommend PT/OT/SLP consult.   Electronically signed by:  Lynnae Sandhoff, MD Page: [phone] 10/15/2020, 2:14 AM

## 2020-10-16 ENCOUNTER — Inpatient Hospital Stay (HOSPITAL_COMMUNITY): Payer: Medicare Other

## 2020-10-16 DIAGNOSIS — I6389 Other cerebral infarction: Secondary | ICD-10-CM

## 2020-10-16 LAB — ECHOCARDIOGRAM COMPLETE
Area-P 1/2: 2.77 cm2
Height: 62 in
S' Lateral: 2.4 cm
Weight: 1989.43 oz

## 2020-10-16 LAB — GLUCOSE, CAPILLARY
Glucose-Capillary: 149 mg/dL — ABNORMAL HIGH (ref 70–99)
Glucose-Capillary: 244 mg/dL — ABNORMAL HIGH (ref 70–99)
Glucose-Capillary: 306 mg/dL — ABNORMAL HIGH (ref 70–99)
Glucose-Capillary: 39 mg/dL — CL (ref 70–99)
Glucose-Capillary: 407 mg/dL — ABNORMAL HIGH (ref 70–99)
Glucose-Capillary: 447 mg/dL — ABNORMAL HIGH (ref 70–99)
Glucose-Capillary: 45 mg/dL — ABNORMAL LOW (ref 70–99)
Glucose-Capillary: 62 mg/dL — ABNORMAL LOW (ref 70–99)
Glucose-Capillary: 75 mg/dL (ref 70–99)
Glucose-Capillary: 79 mg/dL (ref 70–99)

## 2020-10-16 LAB — BASIC METABOLIC PANEL
Anion gap: 8 (ref 5–15)
Anion gap: 7 (ref 5–15)
BUN: 42 mg/dL — ABNORMAL HIGH (ref 8–23)
BUN: 46 mg/dL — ABNORMAL HIGH (ref 8–23)
CO2: 30 mmol/L (ref 22–32)
CO2: 30 mmol/L (ref 22–32)
Calcium: 9.2 mg/dL (ref 8.9–10.3)
Calcium: 9.4 mg/dL (ref 8.9–10.3)
Chloride: 97 mmol/L — ABNORMAL LOW (ref 98–111)
Chloride: 100 mmol/L (ref 98–111)
Creatinine, Ser: 1.85 mg/dL — ABNORMAL HIGH (ref 0.44–1.00)
Creatinine, Ser: 2.49 mg/dL — ABNORMAL HIGH (ref 0.44–1.00)
GFR, Estimated: 29 mL/min — ABNORMAL LOW (ref >60)
GFR, Estimated: 20 mL/min — ABNORMAL LOW (ref >60)
Glucose, Bld: 383 mg/dL — ABNORMAL HIGH (ref 70–99)
Glucose, Bld: 61 mg/dL — ABNORMAL LOW (ref 70–99)
Potassium: 5.5 mmol/L — ABNORMAL HIGH (ref 3.5–5.1)
Potassium: 4.9 mmol/L (ref 3.5–5.1)
Sodium: 135 mmol/L (ref 135–145)
Sodium: 137 mmol/L (ref 135–145)

## 2020-10-16 MED ORDER — INSULIN ASPART 100 UNIT/ML IJ SOLN
0.0000 [IU] | INTRAMUSCULAR | Status: DC
  Administered 2020-10-16: 9 [IU] via SUBCUTANEOUS
  Administered 2020-10-16: 5 [IU] via SUBCUTANEOUS
  Administered 2020-10-17: 0.6 [IU] via SUBCUTANEOUS

## 2020-10-16 MED ORDER — SODIUM CHLORIDE 0.9 % IV SOLN: INTRAVENOUS | Status: DC

## 2020-10-16 MED ORDER — SODIUM ZIRCONIUM CYCLOSILICATE 10 G PO PACK
10.0000 g | PACK | Freq: Once | ORAL | Status: AC
  Administered 2020-10-16: 10 g via ORAL
  Filled 2020-10-16: qty 1

## 2020-10-16 NOTE — Progress Notes (Signed)
  Echocardiogram 2D Echocardiogram has been performed.  Kendra Walker 10/16/2020, 12:07 PM

## 2020-10-16 NOTE — Progress Notes (Signed)
Accu check at 2200 not done by CNA and pt fell asleep. Accu check done at [place] was 39. Pt asymptomatic. Pt given Cheetos and 240 ml of AJ per her choice

## 2020-10-16 NOTE — Progress Notes (Signed)
PROGRESS NOTE    Kendra Walker  GUR:[id number] DOB: 09-Oct-1950 DOA: 10/14/2020 PCP: Curlene Labrum, MD   Chief Complaint  Patient presents with   Extremity Weakness    R arm   Brief Narrative/Hospital Course: 70 year old female with diabetes, hypertension hyperlipidemia, PAD, hypothyroidism, CKD presented with slurred speech, blurred vision lasting for 2 minutes at 2 PM 10/14/2020 also had right arm numbness right arm weakness that is started earlier, she had a routine eye checkup and was told her that they thought she had a stroke and sent to the ED where she was reporting little bit of right arm weakness but resolution of all of her symptoms.  In the ED CT head negative, MRI "Punctate acute cortical infarct within the posterior left frontal lobe (motor strip). Small chronic cortical infarcts within the left parietooccipital lobes with associated chronic hemosiderin deposition at this site. Mild generalized cerebral atroph' blood pressure as high as 200/71 , neurology was consulted, admitted for a stroke work-up.  Seen by neurology Patient had HbA1c 8.0 LDL 67 no further work-up done with MRA angio head showed bilateral ICA siphon atherosclerosis up to moderate associated left ICA supraclinoid stenosis negative intracranial MRA, echocardiogram also ordered.  Neurology advised dual antiplatelet x3 weeks then aspirin alone, high intensity statin.  Subjective: Overnight hypoglycemic this morning blood sugar running in 400s. Potassium on higher side Sugar running high now- C/o ongonig mild weakness on RUE  Assessment & Plan:  Small acute stroke in the left motor strip at the region of right upper extremity Chronic left parieto-occipital stroke with right Extremity weakness acute stroke due to ischemia:  Continue on telemetry, appreciate neurology input work-up as below  BP goal : Permissive HTN upto 220/110 mmHg  MRI Brain: Punctate acute cortical infarct as above MRA head and neck  bilateral ICA siphon atherosclerosis up to moderate associated left ICA supraclinoid stenosis negative intracranial MRA Echocardiogram pending HgbA1c 8.0, fasting lipid pane ldl 67-continue high intensity statin Antiplatelet therapy:ASA 81 mg plus 3 weeks of Plavix .  Normally on aspirin 81 mg at home. PT OT and speech Frequent neuro checks Likely need cardiac monitoring to rule out A. fib as outpatient per neurology  CKD stage IIIb Metabolic acidosis : Renal function currently at baseline.  On sodium bicarb.  Potassium on higher side will give dose of Lokelma.  Keep on gentle IV fluids Recent Labs  Lab 10/14/20 1226 10/14/20 1325 10/15/20 0554 10/16/20 0504  BUN 71* 45* 44* 42*  CREATININE 1.80* 1.59* 1.78* 1.85*    Hyperkalemia: Received Lokelma 9/23.  Redose Lokelma this morning keep on IV fluids address blood sugar . Cont ivf. Recent Labs  Lab 10/14/20 1226 10/14/20 1325 10/15/20 0554 10/16/20 0504  K 8.2* 5.4* 4.5 5.5*      CAD status post CABG PAD s/p bilateral common iliac artery stenting in 2021  HTN  HLD: BP is well controlled.  No chest pain.  No leg pain.  Continue on statins dual antiplatelets and home antihypertensives   T1DM with stable proliferative diabetic retinopathy on insulin pump A1c poorly controlled 8.0.  Blood sugar poorly controlled this morning after episode of hypoglycemia midnight, adding sliding scale insulin coverage, serial insulin pump.  Recent Labs  Lab 10/15/20 1550 10/16/20 0004 10/16/20 0103 10/16/20 0210 10/16/20 0827  GLUCAP 167* 39* 149* 244* 407*    Adrenal insufficiency Stable, continue her fludrocortisone and prednisone.  Hypothyroidism continue Synthroid.  TSH as outpatient  Diet Order  Diet Carb Modified Fluid consistency: Thin; Room service appropriate? Yes  Diet effective now                   DVT prophylaxis: enoxaparin (LOVENOX) injection 30 mg Start: 10/15/20 1000 Code Status:   Code Status:  Full Code  Family Communication: plan of care discussed with patient at bedside. Status is: Inpatient  Remains inpatient appropriate because:Ongoing diagnostic testing needed not appropriate for outpatient work up and Inpatient level of care appropriate due to severity of illness Dispo: The patient is from: Home              Anticipated d/c is to: Home once k and sugar stabilizes              Patient currently is not medically stable to d/c.   Difficult to place patient No Objective: Vitals: Today's Vitals   10/16/20 0008 10/16/20 0325 10/16/20 0733 10/16/20 0800  BP: (!) 146/61 (!) 130/54 (!) 131/53   Pulse: 78 86 68   Resp: 16 18 18    Temp: (!) 97.4 F (36.3 C) (!) 97.5 F (36.4 C)    TempSrc: Oral Oral    SpO2: 97% 100% 97%   Weight:      Height:      PainSc: 0-No pain   0-No pain   Examination: General exam: AAOx 3,older than stated age, weak appearing. HEENT:Oral mucosa moist, Ear/Nose WNL grossly, dentition normal. Respiratory system: bilaterally clear breath sounds, no use of accessory muscle Cardiovascular system: S1 & S2 +, No JVD,. Gastrointestinal system: Abdomen soft, NT,ND, BS+ Nervous System:Alert, awake,oriented, mild weakness on rt arm. moving extremities and grossly nonfocal Extremities: No edema, distal peripheral pulses palpable.  Skin: No rashes,no icterus. MSK: Normal muscle bulk,tone, power    Intake/Output Summary (Last 24 hours) at 10/16/2020 0932 Last data filed at 10/16/2020 0004 Gross per 24 hour  Intake 480 ml  Output --  Net 480 ml   Filed Weights   10/14/20 1040 10/14/20 2135  Weight: 56.7 kg 56.4 kg   Weight change:    Consultants: neurology  Procedures:see note Antimicrobials: Anti-infectives (From admission, onward)    None      Culture/Microbiology No results found for: SDES, SPECREQUEST, CULT, REPTSTATUS  Other culture-see note  Unresulted Labs (From admission, onward)     Start     Ordered   10/16/20 3557  Basic  metabolic panel  Daily,   R     Question:  Specimen collection method  Answer:  Lab=Lab collect   10/15/20 0722           Medications reviewed:  Scheduled Meds:  aspirin EC  81 mg Oral Daily   atorvastatin  80 mg Oral QPM   clopidogrel  75 mg Oral Daily   enoxaparin (LOVENOX) injection  30 mg Subcutaneous Q24H   fludrocortisone  0.05 mg Oral Daily   influenza vaccine adjuvanted  0.5 mL Intramuscular Tomorrow-1000   insulin aspart  0-9 Units Subcutaneous Q4H   insulin pump   Subcutaneous TID WC, HS, 0200   iron polysaccharides  150 mg Oral QHS   levothyroxine  112 mcg Oral Daily   magnesium chloride  2 tablet Oral QHS   magnesium chloride  1 tablet Oral Daily   predniSONE  5 mg Oral Q breakfast   sodium bicarbonate  650 mg Oral BID   sodium zirconium cyclosilicate  10 g Oral Once   Continuous Infusions:   Intake/Output from previous day: 09/24 0701 -  09/25 0700 In: 600 [P.O.:600] Out: -  Intake/Output this shift: No intake/output data recorded. Filed Weights   10/14/20 1040 10/14/20 2135  Weight: 56.7 kg 56.4 kg   Data Reviewed: I have personally reviewed following labs and imaging studies CBC: Recent Labs  Lab 10/14/20 1220 10/14/20 1226  WBC 9.6  --   NEUTROABS 6.5  --   HGB 14.1 15.3*  HCT 43.0 45.0  MCV 95.3  --   PLT 278  --     Basic Metabolic Panel: Recent Labs  Lab 10/14/20 1226 10/14/20 1325 10/15/20 0554 10/16/20 0504  NA 135 137 137 135  K 8.2* 5.4* 4.5 5.5*  CL 100 100 101 97*  CO2  --  30 28 30   GLUCOSE 170* 194* 215* 383*  BUN 71* 45* 44* 42*  CREATININE 1.80* 1.59* 1.78* 1.85*  CALCIUM  --  9.5 9.3 9.2    GFR: Estimated Creatinine Clearance: 22.7 mL/min (A) (by C-G formula based on SCr of 1.85 mg/dL (H)). Liver Function Tests: Recent Labs  Lab 10/14/20 1325  AST 28  ALT 23  ALKPHOS 55  BILITOT 0.8  PROT 6.5  ALBUMIN 3.8    No results for input(s): LIPASE, AMYLASE in the last 168 hours. No results for input(s): AMMONIA  in the last 168 hours. Coagulation Profile: Recent Labs  Lab 10/14/20 1311  INR 0.9    Cardiac Enzymes: No results for input(s): CKTOTAL, CKMB, CKMBINDEX, TROPONINI in the last 168 hours. BNP (last 3 results) No results for input(s): PROBNP in the last 8760 hours. HbA1C: Recent Labs    10/15/20 0554  HGBA1C 8.0*    CBG: Recent Labs  Lab 10/15/20 1550 10/16/20 0004 10/16/20 0103 10/16/20 0210 10/16/20 0827  GLUCAP 167* 39* 149* 244* 407*    Lipid Profile: Recent Labs    10/15/20 0554  CHOL 154  HDL 76  LDLCALC 67  TRIG 55  CHOLHDL 2.0    Thyroid Function Tests: No results for input(s): TSH, T4TOTAL, FREET4, T3FREE, THYROIDAB in the last 72 hours. Anemia Panel: No results for input(s): VITAMINB12, FOLATE, FERRITIN, TIBC, IRON, RETICCTPCT in the last 72 hours. Sepsis Labs: No results for input(s): PROCALCITON, LATICACIDVEN in the last 168 hours.  Recent Results (from the past 240 hour(s))  Resp Panel by RT-PCR (Flu A&B, Covid) Nasopharyngeal Swab     Status: None   Collection Time: 10/14/20 11:15 AM   Specimen: Nasopharyngeal Swab; Nasopharyngeal(NP) swabs in vial transport medium  Result Value Ref Range Status   SARS Coronavirus 2 by RT PCR NEGATIVE NEGATIVE Final    Comment: (NOTE) SARS-CoV-2 target nucleic acids are NOT DETECTED.  The SARS-CoV-2 RNA is generally detectable in upper respiratory specimens during the acute phase of infection. The lowest concentration of SARS-CoV-2 viral copies this assay can detect is 138 copies/mL. A negative result does not preclude SARS-Cov-2 infection and should not be used as the sole basis for treatment or other patient management decisions. A negative result may occur with  improper specimen collection/handling, submission of specimen other than nasopharyngeal swab, presence of viral mutation(s) within the areas targeted by this assay, and inadequate number of viral copies(<138 copies/mL). A negative result must  be combined with clinical observations, patient history, and epidemiological information. The expected result is Negative.  Fact Sheet for Patients:  EntrepreneurPulse.com.au  Fact Sheet for Healthcare Providers:  IncredibleEmployment.be  This test is no t yet approved or cleared by the [place] FDA and  has been authorized for detection and/or  diagnosis of SARS-CoV-2 by FDA under an Emergency Use Authorization (EUA). This EUA will remain  in effect (meaning this test can be used) for the duration of the COVID-19 declaration under Section 564(b)(1) of the Act, 21 U.S.C.section 360bbb-3(b)(1), unless the authorization is terminated  or revoked sooner.       Influenza A by PCR NEGATIVE NEGATIVE Final   Influenza B by PCR NEGATIVE NEGATIVE Final    Comment: (NOTE) The Xpert Xpress SARS-CoV-2/FLU/RSV plus assay is intended as an aid in the diagnosis of influenza from Nasopharyngeal swab specimens and should not be used as a sole basis for treatment. Nasal washings and aspirates are unacceptable for Xpert Xpress SARS-CoV-2/FLU/RSV testing.  Fact Sheet for Patients: EntrepreneurPulse.com.au  Fact Sheet for Healthcare Providers: IncredibleEmployment.be  This test is not yet approved or cleared by the [place] FDA and has been authorized for detection and/or diagnosis of SARS-CoV-2 by FDA under an Emergency Use Authorization (EUA). This EUA will remain in effect (meaning this test can be used) for the duration of the COVID-19 declaration under Section 564(b)(1) of the Act, 21 U.S.C. section 360bbb-3(b)(1), unless the authorization is terminated or revoked.  Performed at [facility], [address] [place], [place] [address]       Radiology Studies: CT HEAD WO CONTRAST  Result Date: 10/14/2020 CLINICAL DATA:  Slurred speech, right arm numbness EXAM: CT HEAD WITHOUT CONTRAST TECHNIQUE: Contiguous  axial images were obtained from the base of the skull through the vertex without intravenous contrast. COMPARISON:  None. FINDINGS: Brain: There is no evidence of acute intracranial hemorrhage, extra-axial fluid collection, or acute infarct. The ventricles are normal in size. There is no mass lesion. There is no midline shift. There is no significant burden of chronic white matter microangiopathy. Vascular: There is dense calcification of the bilateral cavernous ICAs. Skull: Normal. Negative for fracture or focal lesion. Sinuses/Orbits: The imaged paranasal sinuses are clear. Bilateral lens implants are in place. The globes and orbits are otherwise unremarkable. Other: There is a 1.3 cm partially calcified nodule in the right frontoparietal scalp. IMPRESSION: 1. No acute intracranial pathology. 2. 1.3 cm partially calcified in the right frontoparietal scalp is indeterminate. Correlate with physical exam. Electronically Signed   By: Valetta Mole M.D.   On: 10/14/2020 12:13   MR ANGIO HEAD WO CONTRAST  Result Date: 10/15/2020 CLINICAL DATA:  70 year old female with punctate cortical infarct in the left MCA territory on MRI yesterday. EXAM: MRA HEAD WITHOUT CONTRAST TECHNIQUE: Angiographic images of the Circle of Willis were acquired using MRA technique without intravenous contrast. COMPARISON:  Brain MRI yesterday.  Neck MRA today. FINDINGS: No intracranial mass effect or ventriculomegaly. Codominant distal vertebral arteries with antegrade flow. Patent basilar artery with no distal vertebral or basilar stenosis. Patent PICA origins, SCA, and PCA origins. Both posterior communicating arteries are present. Bilateral PCA branches are within normal limits. Antegrade flow in both ICA siphons. Evidence of calcified bilateral ICA atherosclerosis. This most affects the junctions of the petrous and cavernous segments and supraclinoid segments with mild to moderate left supraclinoid stenosis on series 253, image 13. Mild  stenosis otherwise. Posterior communicating artery and right ophthalmic artery origins are normal. Patent carotid termini, MCA and ACA origins. Anterior communicating artery and visible ACA branches are within normal limits. Bilateral MCA M1 segments, MCA bi/trifurcations, and visible bilateral MCA branches are within normal limits. IMPRESSION: 1. Bilateral ICA siphon atherosclerosis. Up to moderate associated Left ICA supraclinoid stenosis. 2. But otherwise negative intracranial MRA. Electronically  Signed   By: Genevie Ann M.D.   On: 10/15/2020 11:05   MR ANGIO NECK W WO CONTRAST  Result Date: 10/15/2020 CLINICAL DATA:  70 year old female with punctate cortical infarct in the left MCA territory on MRI yesterday. EXAM: MRA NECK WITHOUT AND WITH CONTRAST TECHNIQUE: Multiplanar and multiecho pulse sequences of the neck were obtained without and with intravenous contrast. Angiographic images of the neck were obtained using MRA technique without and with intravenous contrast. CONTRAST:  5.59mL GADAVIST GADOBUTROL 1 MMOL/ML IV SOLN COMPARISON:  Head CT and brain MRI 10/14/2020. FINDINGS: Precontrast time-of-flight imaging demonstrates a 3 vessel arch configuration with antegrade flow in the bilateral cervical carotid and vertebral arteries. Antegrade flow continues to the skull base. Relatively codominant vertebrals. Postcontrast neck MRA imaging. Proximal great vessels are within normal limits. Normal right CCA. Right carotid bifurcation and cervical right ICA are within normal limits. Normal left CCA aside from mild tortuosity. Left carotid bifurcation and cervical right ICA are within normal limits. Normal proximal subclavian arteries and vertebral artery origins. Fairly codominant vertebral arteries appear normal to the skull base. Grossly normal visible postcontrast anterior and posterior circulation. IMPRESSION: Negative neck MRA. Electronically Signed   By: Genevie Ann M.D.   On: 10/15/2020 11:01   MR Brain Wo  Contrast (neuro protocol)  Result Date: 10/14/2020 CLINICAL DATA:  Neuro deficit, acute, stroke suspected. Additional history provided: Patient developed slurred speech lasting approximately 40 minutes, right arm numbness, right arm weakness, all now resolved except for persistent right arm weakness. EXAM: MRI HEAD WITHOUT CONTRAST TECHNIQUE: Multiplanar, multiecho pulse sequences of the brain and surrounding structures were obtained without intravenous contrast. COMPARISON:  Head CT 10/14/2020. FINDINGS: Brain: Mild generalized cerebral atrophy. Punctate acute cortical infarct within the posterior left frontal lobe (motor strip) (series 5, image 28) (series 7, image 16). Small chronic cortical infarcts within the left parietooccipital lobes with associated chronic hemosiderin deposition at this site. There are a few additional scattered supratentorial chronic microhemorrhages No evidence of an intracranial mass. No extra-axial fluid collection. No midline shift. Vascular: Maintained flow voids within the proximal large arterial vessels. Skull and upper cervical spine: Focal suspicious marrow lesion. Sinuses/Orbits: Visualized orbits show no acute finding. Bilateral lens replacements. Other: Trace fluid within the left mastoid air cells. Redemonstrated partially calcified right parietal scalp lesion, nonspecific but likely reflecting a cyst. IMPRESSION: Punctate acute cortical infarct within the posterior left frontal lobe (motor strip). Small chronic cortical infarcts within the left parietooccipital lobes with associated chronic hemosiderin deposition at this site. Mild generalized cerebral atrophy. Trace fluid within the left mastoid air cells. Electronically Signed   By: Kellie Simmering D.O.   On: 10/14/2020 14:52   VAS US CAROTID  Result Date: 10/15/2020 Carotid Arterial Duplex Study Patient Name:  Kendra Walker  Date of Exam:   10/15/2020 Medical Rec #: [id number]        Accession #:    [id number] Date of  Birth: 1950/07/29       Patient Gender: F Patient Age:   58 years Exam Location:  [facility] Procedure:      VAS US CAROTID Referring Phys: York Cerise METZGER-CIHELKA --------------------------------------------------------------------------------  Indications:       CVA. Risk Factors:      Hypertension, hyperlipidemia, Diabetes I, coronary artery                    disease, PAD. Comparison Study:  No prior studies. Performing Technologist: Darlin Coco RDMS, RVT  Examination  Guidelines: A complete evaluation includes B-mode imaging, spectral Doppler, color Doppler, and power Doppler as needed of all accessible portions of each vessel. Bilateral testing is considered an integral part of a complete examination. Limited examinations for reoccurring indications may be performed as noted.  Right Carotid Findings: +----------+--------+--------+--------+------------------+--------+           PSV cm/sEDV cm/sStenosisPlaque DescriptionComments +----------+--------+--------+--------+------------------+--------+ CCA Prox  75      9                                          +----------+--------+--------+--------+------------------+--------+ CCA Distal48      10                                         +----------+--------+--------+--------+------------------+--------+ ICA Prox  43      13      1-39%   heterogenous               +----------+--------+--------+--------+------------------+--------+ ICA Distal101     33                                         +----------+--------+--------+--------+------------------+--------+ ECA       68                      heterogenous               +----------+--------+--------+--------+------------------+--------+ +----------+--------+-------+----------------+-------------------+           PSV cm/sEDV cmsDescribe        Arm Pressure (mmHG) +----------+--------+-------+----------------+-------------------+ Subclavian119             Multiphasic, WNL                    +----------+--------+-------+----------------+-------------------+ +---------+--------+--+--------+--+---------+ VertebralPSV cm/s52EDV cm/s10Antegrade +---------+--------+--+--------+--+---------+  Left Carotid Findings: +----------+--------+--------+--------+------------------+--------+           PSV cm/sEDV cm/sStenosisPlaque DescriptionComments +----------+--------+--------+--------+------------------+--------+ CCA Prox  92      10                                         +----------+--------+--------+--------+------------------+--------+ CCA Distal45      9                                          +----------+--------+--------+--------+------------------+--------+ ICA Prox  36      15              heterogenous               +----------+--------+--------+--------+------------------+--------+ ICA Distal75      22                                         +----------+--------+--------+--------+------------------+--------+ ECA       66                      heterogenous               +----------+--------+--------+--------+------------------+--------+ +----------+--------+--------+----------------+-------------------+  PSV cm/sEDV cm/sDescribe        Arm Pressure (mmHG) +----------+--------+--------+----------------+-------------------+ [id number]             Multiphasic, WNL                    +----------+--------+--------+----------------+-------------------+ +---------+--------+--+--------+--+---------+ VertebralPSV cm/s67EDV cm/s16Antegrade +---------+--------+--+--------+--+---------+   Summary: Right Carotid: Velocities in the right ICA are consistent with a 1-39% stenosis. Left Carotid: Velocities in the left ICA are consistent with a 1-39% stenosis. Vertebrals:  Bilateral vertebral arteries demonstrate antegrade flow. Subclavians: Normal flow hemodynamics were seen in bilateral subclavian               arteries. *See table(s) above for measurements and observations.     Preliminary      LOS: 2 days   Antonieta Pert, MD [facility]  10/16/2020, 9:09 AM

## 2020-10-16 NOTE — Progress Notes (Signed)
STROKE TEAM PROGRESS NOTE   INTERVAL HISTORY Her husband is at the bedside.  Echocardiogram is yet pending.  Carotid ultrasound shows no significant extracranial stenosis.  Patient is willing for loop recorder for paroxysmal A. fib.  Vital signs are stable.  Neurological exam is unchanged. Vitals:   10/16/20 0008 10/16/20 0325 10/16/20 0733 10/16/20 1118  BP: (!) 146/61 (!) 130/54 (!) 131/53 131/72  Pulse: 78 86 68 74  Resp: 16 18 18 18   Temp: (!) 97.4 F (36.3 C) (!) 97.5 F (36.4 C) 98 F (36.7 C) 97.9 F (36.6 C)  TempSrc: Oral Oral  Oral  SpO2: 97% 100% 97% 97%  Weight:      Height:       CBC:  Recent Labs  Lab 10/14/20 1220 10/14/20 1226  WBC 9.6  --   NEUTROABS 6.5  --   HGB 14.1 15.3*  HCT 43.0 45.0  MCV 95.3  --   PLT 278  --    Basic Metabolic Panel:  Recent Labs  Lab 10/15/20 0554 10/16/20 0504  NA 137 135  K 4.5 5.5*  CL 101 97*  CO2 28 30  GLUCOSE 215* 383*  BUN 44* 42*  CREATININE 1.78* 1.85*  CALCIUM 9.3 9.2   Lipid Panel:  Recent Labs  Lab 10/15/20 0554  CHOL 154  TRIG 55  HDL 76  CHOLHDL 2.0  VLDL 11  LDLCALC 67   HgbA1c:  Recent Labs  Lab 10/15/20 0554  HGBA1C 8.0*   Urine Drug Screen:  Recent Labs  Lab 10/14/20 1405  LABOPIA NONE DETECTED  COCAINSCRNUR NONE DETECTED  LABBENZ NONE DETECTED  AMPHETMU NONE DETECTED  THCU NONE DETECTED  LABBARB NONE DETECTED    Alcohol Level  Recent Labs  Lab 10/14/20 1311  ETH <10    IMAGING past 24 hours ECHOCARDIOGRAM COMPLETE  Result Date: 10/16/2020    ECHOCARDIOGRAM REPORT   Patient Name:   Kendra Walker Date of Exam: 10/16/2020 Medical Rec #:  [id number]       Height:       62.0 in Accession #:    [id number]      Weight:       124.3 lb Date of Birth:  Jun 06, 1950      BSA:          1.562 m Patient Age:    70 years        BP:           131/72 mmHg Patient Gender: F               HR:           72 bpm. Exam Location:  Inpatient Procedure: 2D Echo Indications:    stroke  History:         Patient has prior history of Echocardiogram examinations, most                 recent 09/20/2016. CAD, PAD and chronic kidney disease; Risk                 Factors:Hypertension and Diabetes.  Sonographer:    Johny Chess RDCS Referring Phys: [place]  1. Left ventricular ejection fraction, by estimation, is 60 to 65%. The left ventricle has normal function. The left ventricle has no regional wall motion abnormalities. Left ventricular diastolic parameters are consistent with Grade I diastolic dysfunction (impaired relaxation).  2. Right ventricular systolic function is normal. The right ventricular size is normal.  There is normal pulmonary artery systolic pressure.  3. The mitral valve is normal in structure. Trivial mitral valve regurgitation. No evidence of mitral stenosis.  4. The aortic valve is calcified. There is moderate calcification of the aortic valve. There is moderate thickening of the aortic valve. Aortic valve regurgitation is trivial. Mild to moderate aortic valve sclerosis/calcification is present, without any  evidence of aortic stenosis.  5. The inferior vena cava is normal in size with greater than 50% respiratory variability, suggesting right atrial pressure of 3 mmHg. Conclusion(s)/Recommendation(s): No intracardiac source of embolism detected on this transthoracic study. A transesophageal echocardiogram is recommended to exclude cardiac source of embolism if clinically indicated. FINDINGS  Left Ventricle: Left ventricular ejection fraction, by estimation, is 60 to 65%. The left ventricle has normal function. The left ventricle has no regional wall motion abnormalities. The left ventricular internal cavity size was normal in size. There is  no left ventricular hypertrophy. Left ventricular diastolic parameters are consistent with Grade I diastolic dysfunction (impaired relaxation). Right Ventricle: The right ventricular size is normal. No increase in right  ventricular wall thickness. Right ventricular systolic function is normal. There is normal pulmonary artery systolic pressure. The tricuspid regurgitant velocity is 1.68 m/s, and  with an assumed right atrial pressure of 3 mmHg, the estimated right ventricular systolic pressure is 76.1 mmHg. Left Atrium: Left atrial size was normal in size. Right Atrium: Right atrial size was normal in size. Pericardium: There is no evidence of pericardial effusion. Mitral Valve: The mitral valve is normal in structure. Trivial mitral valve regurgitation. No evidence of mitral valve stenosis. Tricuspid Valve: The tricuspid valve is normal in structure. Tricuspid valve regurgitation is trivial. No evidence of tricuspid stenosis. Aortic Valve: The aortic valve is calcified. There is moderate calcification of the aortic valve. There is moderate thickening of the aortic valve. Aortic valve regurgitation is trivial. Mild to moderate aortic valve sclerosis/calcification is present, without any evidence of aortic stenosis. Pulmonic Valve: The pulmonic valve was normal in structure. Pulmonic valve regurgitation is not visualized. No evidence of pulmonic stenosis. Aorta: The aortic root is normal in size and structure. Venous: The inferior vena cava is normal in size with greater than 50% respiratory variability, suggesting right atrial pressure of 3 mmHg. IAS/Shunts: No atrial level shunt detected by color flow Doppler.  LEFT VENTRICLE PLAX 2D LVIDd:         4.20 cm LVIDs:         2.40 cm LV PW:         0.80 cm LV IVS:        0.70 cm LVOT diam:     1.50 cm LV SV:         45 LV SV Index:   29 LVOT Area:     1.77 cm  RIGHT VENTRICLE         IVC TAPSE (M-mode): 1.3 cm  IVC diam: 1.20 cm LEFT ATRIUM             Index       RIGHT ATRIUM           Index LA diam:        2.90 cm 1.86 cm/m  RA Area:     10.40 cm LA Vol (A2C):   35.6 ml 22.79 ml/m RA Volume:   22.20 ml  14.21 ml/m LA Vol (A4C):   32.9 ml 21.06 ml/m LA Biplane Vol: 35.2 ml 22.53  ml/m  AORTIC VALVE LVOT Vmax:  122.00 cm/s LVOT Vmean:  82.000 cm/s LVOT VTI:    0.253 m  AORTA Ao Root diam: 2.50 cm Ao Asc diam:  3.00 cm MITRAL VALVE                TRICUSPID VALVE MV Area (PHT): 2.77 cm     TR Peak grad:   11.3 mmHg MV Decel Time: 274 msec     TR Vmax:        168.00 cm/s MV E velocity: 108.00 cm/s MV A velocity: 105.00 cm/s  SHUNTS MV E/A ratio:  1.03         Systemic VTI:  0.25 m                             Systemic Diam: 1.50 cm Candee Furbish MD Electronically signed by Candee Furbish MD Signature Date/Time: 10/16/2020/12:12:43 PM    Final     PHYSICAL EXAM Constitutional: Appears well-developed and well-nourished.  Middle-aged Caucasian lady Psych: Affect appropriate to situation Eyes: No scleral injection HENT: No OP obstruction. Head: Normocephalic.  Cardiovascular: Normal rate and regular rhythm.  Respiratory: Effort normal, symmetric excursions bilaterally, no audible wheezing. GI: Soft.  No distension. There is no tenderness.  Skin: WDI, poorly healed indented large area of scar tissue on top of inferior ankle.   NEURO: Mental Status: Patient is awake, alert, oriented to person, place, month, year, and situation. Patient is able to give a clear and coherent history. Speech fluent, intact comprehension and repetition. No signs of aphasia or neglect. Visual Fields are full. Pupils are equal, round, and reactive to light. EOMI without ptosis or diploplia. Poor baseline vision Facial sensation is symmetric to temperature Facial movement is symmetric.  Hearing is intact to voice. Uvula midline and palate elevates symmetrically. Shoulder shrug is symmetric. Tongue is midline without atrophy or fasciculations.  Tone is normal. Bulk is normal. 5/5 strength was present in all four extremities. 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 Sensation is symmetric to light touch and temperature in  the arms and legs. Deep Tendon Reflexes: decreased, but symmetric in the biceps and patellae. FNF and HKS are intact bilaterally. Gait - Deferred  ASSESSMENT/PLAN Kendra Walker is a 70 y.o. female with history of prev stroke, HLD, HTN, DM2, PAD presenting with Right UE weakness, now resolved and pt reports feeling back to baseline. No acute stroke treatments d/t outside of time window and no LVO.   Stroke:  Small left cortical infarct embolic appearing. Work up underway for etiology. May need Loop if none found.  CT head No acute abnormality. MRI  tiny Left cortical infarct MRA bilateral ICA siphon atherosclerosis and moderate left ICA supraclinoid stenosis. Carotid Doppler no significant bilateral extracranial carotid stenosis.   2D Echo pending LDL 67 HgbA1c 8.0 VTE prophylaxis - lovenox    Diet   Diet Carb Modified Fluid consistency: Thin; Room service appropriate? Yes   none  prior to admission, now on aspirin 81 mg daily and clopidogrel 75 mg daily. Recommend DAPT for 3 weeks, then ASA alone, unless Afib is detected, then Eliquis alone would be recommendation. Therapy recommendations: No PT/OT follow-up is necessary Disposition: Home  Hypertension Home meds:  none Stable Permissive hypertension (OK if < 220/120) but gradually normalize in 5-7 days Long-term BP goal normotensive  Hyperlipidemia Home meds:  Lipitor 80mg  daily, resumed  in hospital LDL 67, goal < 70 High intensity statin  Continue statin at discharge  Diabetes type II Uncontrolled Home meds:  Insulin pump HgbA1c 8.0, goal < 7.0 CBGs Recent Labs    10/16/20 0210 10/16/20 0827 10/16/20 1117  GLUCAP 244* 407* 447*    SSI  Other Stroke Risk Factors Advanced Age >/= 65  Hx stroke/TIA PAD  Other Active Problems Low vision Left eye secondary to diabetic retinopathy  Hospital day # 2    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  Recommend  Dual antiplatelet therapy for  3 weeks.  Aggressive risk factor modification.  She will need loop recorder at discharge for prolonged cardiac monitoring at discharge for paroxysmal A. fib.  Discussed with patient and Dr. Maren Beach.  Greater than 50% time during this 25-minute visit was spent on counseling and coordination of care and discussion with care team and answering questions  Antony Contras, MD Medical Director [place] Pager: [phone] 10/16/2020 12:15 PM  To contact Stroke Continuity provider, please refer to [email or web address] After hours, contact General Neurology

## 2020-10-17 ENCOUNTER — Encounter (HOSPITAL_COMMUNITY)
Admission: EM | Disposition: A | Discharge status: Home / Self Care | Payer: Self-pay | Source: Home / Self Care | Attending: Internal Medicine

## 2020-10-17 HISTORY — PX: LOOP RECORDER INSERTION: EP1214

## 2020-10-17 LAB — BASIC METABOLIC PANEL
Anion gap: 9 (ref 5–15)
BUN: 39 mg/dL — ABNORMAL HIGH (ref 8–23)
CO2: 26 mmol/L (ref 22–32)
Calcium: 8.4 mg/dL — ABNORMAL LOW (ref 8.9–10.3)
Chloride: 101 mmol/L (ref 98–111)
Creatinine, Ser: 1.64 mg/dL — ABNORMAL HIGH (ref 0.44–1.00)
GFR, Estimated: 34 mL/min — ABNORMAL LOW (ref >60)
Glucose, Bld: 234 mg/dL — ABNORMAL HIGH (ref 70–99)
Potassium: 4.2 mmol/L (ref 3.5–5.1)
Sodium: 136 mmol/L (ref 135–145)

## 2020-10-17 LAB — GLUCOSE, CAPILLARY
Glucose-Capillary: 120 mg/dL — ABNORMAL HIGH (ref 70–99)
Glucose-Capillary: 171 mg/dL — ABNORMAL HIGH (ref 70–99)
Glucose-Capillary: 207 mg/dL — ABNORMAL HIGH (ref 70–99)
Glucose-Capillary: 247 mg/dL — ABNORMAL HIGH (ref 70–99)
Glucose-Capillary: 35 mg/dL — CL (ref 70–99)
Glucose-Capillary: 37 mg/dL — CL (ref 70–99)

## 2020-10-17 SURGERY — LOOP RECORDER INSERTION

## 2020-10-17 MED ORDER — LIDOCAINE-EPINEPHRINE 1 %-1:100000 IJ SOLN
INTRAMUSCULAR | Status: DC | PRN
  Administered 2020-10-17: 10 mL via INTRADERMAL

## 2020-10-17 MED ORDER — LIDOCAINE-EPINEPHRINE 1 %-1:100000 IJ SOLN
INTRAMUSCULAR | Status: AC
  Filled 2020-10-17: qty 1

## 2020-10-17 MED ORDER — CLOPIDOGREL BISULFATE 75 MG PO TABS: 75.0000 mg | ORAL_TABLET | Freq: Every day | ORAL | 0 refills | Status: AC

## 2020-10-17 SURGICAL SUPPLY — 2 items
MONITOR MOBILE MNGR LINQ22 (Prosthesis & Implant Heart) ×1 IMPLANT
PACK LOOP INSERTION (CUSTOM PROCEDURE TRAY) ×2 IMPLANT

## 2020-10-17 NOTE — Progress Notes (Signed)
Inpatient Diabetes Program Recommendations  AACE/ADA: New Consensus Statement on Inpatient Glycemic Control (2015)  Target Ranges:  Prepandial:   less than 140 mg/dL      Peak postprandial:   less than 180 mg/dL (1-2 hours)      Critically ill patients:  140 - 180 mg/dL   Lab Results  Component Value Date   GLUCAP 171 (H) 10/17/2020   HGBA1C 8.0 (H) 10/15/2020    Review of Glycemic Control Results for KARALINA, TIFT (MRN [id number]) as of 10/17/2020 08:22  Ref. Range 10/17/2020 00:06 10/17/2020 00:18 10/17/2020 00:48 10/17/2020 04:28 10/17/2020 07:30  Glucose-Capillary Latest Ref Range: 70 - 99 mg/dL 35 (LL) 37 (LL) 120 (H) 247 (H) 171 (H)    Diabetes history: Type 1 DM Outpatient Diabetes medications: Humalog per [facility] 670 G Current orders for Inpatient glycemic control: insulin pump  Florinef 0.05 mg QD Prednisone 5 mg QAM  Inpatient Diabetes Program Recommendations:    Noted hypoglycemia following short acting insulin in the setting of elevated creatinine. If to remain inpatient consider discontinuing Novolog 0-9 units Q4H.    Patient is followed by Gery Pray, outpatient endocrinology. Sensitive to insulin. Current insulin pump settings are as follows:   Basal  0000 Basal Rate (Units/Hr): 0.1  0400 Basal Rate (Units/Hr): 0.102  0800 Basal Rate (Units/Hr): 0.725  2300 Basal Rate (Units/Hr): 0.15  Total 11.831 units in 24 hours  Insulin:Carb (1) ICR Time: 0000  (2) ICR Time: 0700 (2) ICR : 11  (3) ICR Time: 1200 (3) ICR: 10  (4) ICR Time: 1500 (4) ICR: 10   Sensitivity (1) ISF Time: 0000 (1) ISF: 90  (2) ISF Time: 0800 (2) ISF: 75   Target (1) BG Time: 0000 (1) Target BG (mg/dL): 150  (2) BG Time: 0700 (2) Target BG (mg/dL): 120  (3) BG Time: 2200 (3) Target BG (mg/dL): 150   Following.  Thanks, Bronson Curb, MSN, RNC-OB Diabetes Coordinator [phone] (8a-5p)

## 2020-10-17 NOTE — Discharge Instructions (Signed)
Post procedure wound care instructions Keep incision clean and dry for 3 days. You can remove outer dressing tomorrow. Leave steri-strips (little pieces of tape) on until seen in the office for wound check appointment. Call the office ([phone]) for redness, drainage, swelling, or fever.  

## 2020-10-17 NOTE — Progress Notes (Signed)
STROKE TEAM PROGRESS NOTE   INTERVAL HISTORY  Patient is sitting up in bed.  She is doing well.  She has no complaints.  She is waiting for loop recorder.  She wants to go home.  Neurological exam unchanged.  Vital signs stable.  Echocardiogram shows ejection fraction of 60 to 65% without cardiac source of embolism.  Vitals:   10/17/20 0037 10/17/20 0424 10/17/20 0757 10/17/20 1220  BP: (!) 150/60 (!) 129/50 128/64 (!) 130/49  Pulse: 74 64 75 76  Resp: 18 18 15 19   Temp: (!) 97.5 F (36.4 C) 97.7 F (36.5 C) 98.2 F (36.8 C) 98.7 F (37.1 C)  TempSrc:  Oral Oral Oral  SpO2: 100% 99% 97% 97%  Weight:      Height:       CBC:  Recent Labs  Lab 10/14/20 1220 10/14/20 1226  WBC 9.6  --   NEUTROABS 6.5  --   HGB 14.1 15.3*  HCT 43.0 45.0  MCV 95.3  --   PLT 278  --    Basic Metabolic Panel:  Recent Labs  Lab 10/16/20 1614 10/17/20 0433  NA 137 136  K 4.9 4.2  CL 100 101  CO2 30 26  GLUCOSE 61* 234*  BUN 46* 39*  CREATININE 2.49* 1.64*  CALCIUM 9.4 8.4*   Lipid Panel:  Recent Labs  Lab 10/15/20 0554  CHOL 154  TRIG 55  HDL 76  CHOLHDL 2.0  VLDL 11  LDLCALC 67   HgbA1c:  Recent Labs  Lab 10/15/20 0554  HGBA1C 8.0*   Urine Drug Screen:  Recent Labs  Lab 10/14/20 1405  LABOPIA NONE DETECTED  COCAINSCRNUR NONE DETECTED  LABBENZ NONE DETECTED  AMPHETMU NONE DETECTED  THCU NONE DETECTED  LABBARB NONE DETECTED    Alcohol Level  Recent Labs  Lab 10/14/20 1311  ETH <10    IMAGING past 24 hours No results found.  PHYSICAL EXAM Constitutional: Appears well-developed and well-nourished.  Middle-aged Caucasian lady Psych: Affect appropriate to situation Eyes: No scleral injection HENT: No OP obstruction. Head: Normocephalic.  Cardiovascular: Normal rate and regular rhythm.  Respiratory: Effort normal, symmetric excursions bilaterally, no audible wheezing. GI: Soft.  No distension. There is no tenderness.  Skin: WDI, poorly healed indented  large area of scar tissue on top of inferior ankle.   NEURO: Mental Status: Patient is awake, alert, oriented to person, place, month, year, and situation. Patient is able to give a clear and coherent history. Speech fluent, intact comprehension and repetition. No signs of aphasia or neglect. Visual Fields are full. Pupils are equal, round, and reactive to light. EOMI without ptosis or diploplia. Poor baseline vision Facial sensation is symmetric to temperature Facial movement is symmetric.  Hearing is intact to voice. Uvula midline and palate elevates symmetrically. Shoulder shrug is symmetric. Tongue is midline without atrophy or fasciculations.  Tone is normal. Bulk is normal. 5/5 strength was present in all four extremities. 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 Sensation is symmetric to light touch and temperature in the arms and legs. Deep Tendon Reflexes: decreased, but symmetric in the biceps and patellae. FNF and HKS are intact bilaterally. Gait - Deferred  ASSESSMENT/PLAN [name] is a 70 y.o. female with history of prev stroke, HLD, HTN, DM2, PAD presenting with Right UE weakness, now resolved and pt reports feeling back to baseline. No acute stroke treatments d/t  outside of time window and no LVO.   Stroke:  Small left cortical infarct embolic appearing. Work up underway for etiology. May need Loop if none found.  CT head No acute abnormality. MRI  tiny Left cortical infarct MRA bilateral ICA siphon atherosclerosis and moderate left ICA supraclinoid stenosis. Carotid Doppler no significant bilateral extracranial carotid stenosis.   2D Echo - EF 60 - 65%. No cardiac source of emboli identified.  LDL 67 HgbA1c 8.0 VTE prophylaxis - lovenox    Diet   Diet Carb Modified Fluid consistency: Thin; Room service appropriate? Yes   none  prior to admission, now on aspirin 81 mg daily  and clopidogrel 75 mg daily. Recommend DAPT for 3 weeks, then ASA alone, unless Afib is detected, then Eliquis alone would be recommendation. Therapy recommendations: No PT/OT follow-up is necessary Disposition: Home  Hypertension Home meds:  none Stable Permissive hypertension (OK if < 220/120) but gradually normalize in 5-7 days Long-term BP goal normotensive  Hyperlipidemia Home meds:  Lipitor 80mg  daily, resumed in hospital LDL 67, goal < 70 High intensity statin  Continue statin at discharge  Diabetes type II Uncontrolled Home meds:  Insulin pump HgbA1c 8.0, goal < 7.0 CBGs Recent Labs    10/17/20 0428 10/17/20 0730 10/17/20 1222  GLUCAP 247* 171* 207*    SSI  Other Stroke Risk Factors Advanced Age >/= 93  Hx stroke/TIA PAD  Other Active Problems Low vision 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   Hospital day # 3  Await loop recorder today and patient can be discharged after that.  Recommend aspirin and Plavix for 3 weeks followed by aspirin alone.  Follow-up as an outpatient stroke clinic in 2 months.  Stroke team will sign off.  Kindly call for questions. Antony Contras MD To contact Stroke Continuity provider, please refer to [email or web address] After hours, contact General Neurology

## 2020-10-17 NOTE — Consult Note (Addendum)
ELECTROPHYSIOLOGY CONSULT NOTE  Patient ID: Kendra Walker MRN: [id number], DOB/AGE: 1950/08/24   Admit date: 10/14/2020 Date of Consult: 10/17/2020  Primary Physician: Curlene Labrum, MD Primary Cardiologist: Dr. Harl Bowie VVS: Dr. Trula Slade Endo: follows with endo at [facility] Reason for Consultation: Cryptogenic stroke, recommendations regarding Implantable Loop Recorder, requested by Dr.Sethi  History of Present Illness Kendra Walker was admitted on 10/14/2020 with RUE weakness, dx with stroke.    PMHx includes: PVD (right femoral to above-knee popliteal bypass graft with vein in 2008 in [place]), HTN, HLD, IDDM, Addison's disease, Hashimoto's thyroiditis, CAD (CABG 2004), CKD (III), chronic CHF (diastolic)  Neurology notes: Small left cortical infarct embolic appearing. Work up underway for etiology. May need Loop if none found.  she has undergone workup for stroke including echocardiogram and carotid dopplers.  The patient has been monitored on telemetry which has demonstrated sinus rhythm with no arrhythmias.  Inpatient stroke work-up is to be completed with a TEE.   Echocardiogram this admission demonstrated    IMPRESSIONS   1. Left ventricular ejection fraction, by estimation, is 60 to 65%. The  left ventricle has normal function. The left ventricle has no regional  wall motion abnormalities. Left ventricular diastolic parameters are  consistent with Grade I diastolic  dysfunction (impaired relaxation).   2. Right ventricular systolic function is normal. The right ventricular  size is normal. There is normal pulmonary artery systolic pressure.   3. The mitral valve is normal in structure. Trivial mitral valve  regurgitation. No evidence of mitral stenosis.   4. The aortic valve is calcified. There is moderate calcification of the  aortic valve. There is moderate thickening of the aortic valve. Aortic  valve regurgitation is trivial. Mild to moderate aortic valve   sclerosis/calcification is present, without any   evidence of aortic stenosis.   5. The inferior vena cava is normal in size with greater than 50%  respiratory variability, suggesting right atrial pressure of 3 mmHg.   Conclusion(s)/Recommendation(s): No intracardiac source of embolism  detected on this transthoracic study. A transesophageal echocardiogram is  recommended to exclude cardiac source of embolism if clinically indicated.    Lab work is reviewed.  Prior to admission, the patient denies chest pain, shortness of breath, dizziness, palpitations, or syncope.  They are recovering from their stroke with plans to home at discharge.    Past Medical History:  Diagnosis Date   Cancer ([place])    Squamous Cell   Diabetes mellitus without complication ([place])    Hyperlipemia    Hypertension    Hypothyroidism    PAD (peripheral artery disease) ([place])      Surgical History:  Past Surgical History:  Procedure Laterality Date   ABDOMINAL AORTOGRAM W/LOWER EXTREMITY N/A 06/09/2019   Procedure: ABDOMINAL AORTOGRAM W/LOWER EXTREMITY;  Surgeon: Serafina Mitchell, MD;  Location: [facility];  Service: Cardiovascular;  Laterality: N/A;   APPENDECTOMY     CATARACT EXTRACTION Bilateral    CORONARY ARTERY BYPASS GRAFT     PERIPHERAL VASCULAR INTERVENTION Bilateral 06/09/2019   Procedure: PERIPHERAL VASCULAR INTERVENTION;  Surgeon: Serafina Mitchell, MD;  Location: [facility];  Service: Cardiovascular;  Laterality: Bilateral;   TONSILLECTOMY     YAG LASER APPLICATION Right 1/60/7371   Procedure: YAG LASER APPLICATION;  Surgeon: Williams Che, MD;  Location: [facility];  Service: Ophthalmology;  Laterality: Right;     Medications Prior to Admission  Medication Sig Dispense Refill Last Dose   acetaminophen (  TYLENOL) 500 MG tablet Take 500 mg by mouth every 6 (six) hours as needed for mild pain or headache.    PRN   Ascorbic Acid (VITAMIN C WITH ROSE HIPS) 500 MG tablet Take 500 mg  by mouth at bedtime.   10/13/2020   aspirin EC 81 MG tablet Take 81 mg by mouth daily.   10/14/2020   atorvastatin (LIPITOR) 80 MG tablet Take 1 tablet (80 mg total) by mouth daily. (Patient taking differently: Take 80 mg by mouth every evening.) 30 tablet 6 10/13/2020   Calcium Carbonate (CALCIUM 600 PO) Take 600 mg by mouth in the morning and at bedtime.   10/14/2020   Cholecalciferol (VITAMIN D3) 50 MCG (2000 UT) TABS Take 2,000 Units by mouth daily.   10/14/2020   fludrocortisone (FLORINEF) 0.1 MG tablet Take 0.05 mg by mouth daily.   10/14/2020   HUMALOG 100 UNIT/ML injection Inject into the skin See admin instructions. VIA INSULIN PUMP   10/14/2020   Insulin Human (INSULIN PUMP) SOLN Inject into the skin. HUMALOG 100 UNIT/ML   10/14/2020   iron polysaccharides (NIFEREX) 150 MG capsule Take 150 mg by mouth at bedtime.    10/13/2020   levothyroxine (SYNTHROID) 112 MCG tablet Take 112 mcg by mouth daily.   10/14/2020   magnesium chloride (SLOW-MAG) 64 MG TBEC SR tablet Take 1-2 tablets by mouth See admin instructions. TAKE 1 TABLET IN THE MORNING & TAKE 2 TABLETS AT NIGHT.   10/14/2020   Multiple Vitamin (MULTIVITAMIN WITH MINERALS) TABS tablet Take 1 tablet by mouth daily.   10/14/2020   Multiple Vitamins-Minerals (PRESERVISION AREDS 2 PO) Take 1 tablet by mouth daily.    10/14/2020   Omega-3 Fatty Acids (FISH OIL) 1200 MG CAPS Take 1,200 mg by mouth daily.   10/14/2020   POLY-IRON 150 FORTE 150-25-1 MG-MCG-MG CAPS Take 1 capsule by mouth daily at 6 (six) AM.   10/14/2020   predniSONE (DELTASONE) 5 MG tablet Take 5 mg by mouth daily with breakfast.   10/14/2020   sodium bicarbonate 650 MG tablet Take 650 mg by mouth in the morning and at bedtime.   10/14/2020   GLUCAGEN HYPOKIT 1 MG SOLR injection Inject into the muscle.   PRN    Inpatient Medications:   aspirin EC  81 mg Oral Daily   atorvastatin  80 mg Oral QPM   clopidogrel  75 mg Oral Daily   enoxaparin (LOVENOX) injection  30 mg Subcutaneous Q24H    fludrocortisone  0.05 mg Oral Daily   influenza vaccine adjuvanted  0.5 mL Intramuscular Tomorrow-1000   insulin aspart  0-9 Units Subcutaneous Q4H   insulin pump   Subcutaneous TID WC, HS, 0200   iron polysaccharides  150 mg Oral QHS   levothyroxine  112 mcg Oral Daily   magnesium chloride  2 tablet Oral QHS   magnesium chloride  1 tablet Oral Daily   predniSONE  5 mg Oral Q breakfast   sodium bicarbonate  650 mg Oral BID    Allergies:  Allergies  Allergen Reactions   Bactrim [Sulfamethoxazole-Trimethoprim] Other (See Comments)    Elevates potassium.    Tape Other (See Comments)    not allergic but causes sensitivity.   Fluorescein Rash   Procrit [Epoetin (Alfa)] Rash    Social History   Socioeconomic History   Marital status: Married    Spouse name: Not on file   Number of children: Not on file   Years of education: Not on file  Highest education level: Not on file  Occupational History   Not on file  Tobacco Use   Smoking status: Never   Smokeless tobacco: Never  Vaping Use   Vaping Use: Never used  Substance and Sexual Activity   Alcohol use: No    Alcohol/week: 0.0 standard drinks   Drug use: No   Sexual activity: Not on file  Other Topics Concern   Not on file  Social History Narrative   Not on file   Social Determinants of Health   Financial Resource Strain: Not on file  Food Insecurity: Not on file  Transportation Needs: Not on file  Physical Activity: Not on file  Stress: Not on file  Social Connections: Not on file  Intimate Partner Violence: Not on file     Family History  Problem Relation Age of Onset   Heart disease Mother    Hypertension Mother    Hypertension Father       Review of Systems: All other systems reviewed and are otherwise negative except as noted above.  Physical Exam: Vitals:   10/16/20 2016 10/17/20 0037 10/17/20 0424 10/17/20 0757  BP: (!) 136/56 (!) 150/60 (!) 129/50 128/64  Pulse: 67 74 64 75  Resp: 16 18 18  15   Temp: 97.6 F (36.4 C) (!) 97.5 F (36.4 C) 97.7 F (36.5 C) 98.2 F (36.8 C)  TempSrc: Oral  Oral Oral  SpO2: 99% 100% 99% 97%  Weight:      Height:        GEN- The patient is well appearing, alert and oriented x 3 today.   Head- normocephalic, atraumatic Eyes-  Sclera clear, conjunctiva pink Ears- hearing intact Oropharynx- clear Neck- supple Lungs- CTA b/l normal work of breathing Heart- RRR, no murmurs, rubs or gallops  GI- soft, NT, ND Extremities- no clubbing, cyanosis, or edema MS- no significant deformity or atrophy Skin- no rash or lesion Psych- euthymic mood, full affect   Labs:   Lab Results  Component Value Date   WBC 9.6 10/14/2020   HGB 15.3 (H) 10/14/2020   HCT 45.0 10/14/2020   MCV 95.3 10/14/2020   PLT 278 10/14/2020    Recent Labs  Lab 10/14/20 1325 10/15/20 0554 10/17/20 0433  NA 137   < > 136  K 5.4*   < > 4.2  CL 100   < > 101  CO2 30   < > 26  BUN 45*   < > 39*  CREATININE 1.59*   < > 1.64*  CALCIUM 9.5   < > 8.4*  PROT 6.5  --   --   BILITOT 0.8  --   --   ALKPHOS 55  --   --   ALT 23  --   --   AST 28  --   --   GLUCOSE 194*   < > 234*   < > = values in this interval not displayed.   No results found for: CKTOTAL, CKMB, CKMBINDEX, TROPONINI Lab Results  Component Value Date   CHOL 154 10/15/2020   Lab Results  Component Value Date   HDL 76 10/15/2020   Lab Results  Component Value Date   LDLCALC 67 10/15/2020   Lab Results  Component Value Date   TRIG 55 10/15/2020   Lab Results  Component Value Date   CHOLHDL 2.0 10/15/2020   No results found for: LDLDIRECT  No results found for: DDIMER   Radiology/Studies:  CT HEAD WO CONTRAST  Result  Date: 10/14/2020 CLINICAL DATA:  Slurred speech, right arm numbness EXAM: CT HEAD WITHOUT CONTRAST TECHNIQUE: Contiguous axial images were obtained from the base of the skull through the vertex without intravenous contrast. COMPARISON:  None. FINDINGS: Brain: There is no  evidence of acute intracranial hemorrhage, extra-axial fluid collection, or acute infarct. The ventricles are normal in size. There is no mass lesion. There is no midline shift. There is no significant burden of chronic white matter microangiopathy. Vascular: There is dense calcification of the bilateral cavernous ICAs. Skull: Normal. Negative for fracture or focal lesion. Sinuses/Orbits: The imaged paranasal sinuses are clear. Bilateral lens implants are in place. The globes and orbits are otherwise unremarkable. Other: There is a 1.3 cm partially calcified nodule in the right frontoparietal scalp. IMPRESSION: 1. No acute intracranial pathology. 2. 1.3 cm partially calcified in the right frontoparietal scalp is indeterminate. Correlate with physical exam. Electronically Signed   By: Valetta Mole M.D.   On: 10/14/2020 12:13   MR ANGIO HEAD WO CONTRAST Result Date: 10/15/2020 CLINICAL DATA:  70 year old female with punctate cortical infarct in the left MCA territory on MRI yesterday. EXAM: MRA HEAD WITHOUT CONTRAST TECHNIQUE: Angiographic images of the Circle of Willis were acquired using MRA technique without intravenous contrast. COMPARISON:  Brain MRI yesterday.  Neck MRA today. FINDINGS: No intracranial mass effect or ventriculomegaly. Codominant distal vertebral arteries with antegrade flow. Patent basilar artery with no distal vertebral or basilar stenosis. Patent PICA origins, SCA, and PCA origins. Both posterior communicating arteries are present. Bilateral PCA branches are within normal limits. Antegrade flow in both ICA siphons. Evidence of calcified bilateral ICA atherosclerosis. This most affects the junctions of the petrous and cavernous segments and supraclinoid segments with mild to moderate left supraclinoid stenosis on series 253, image 13. Mild stenosis otherwise. Posterior communicating artery and right ophthalmic artery origins are normal. Patent carotid termini, MCA and ACA origins. Anterior  communicating artery and visible ACA branches are within normal limits. Bilateral MCA M1 segments, MCA bi/trifurcations, and visible bilateral MCA branches are within normal limits. IMPRESSION: 1. Bilateral ICA siphon atherosclerosis. Up to moderate associated Left ICA supraclinoid stenosis. 2. But otherwise negative intracranial MRA. Electronically Signed   By: Genevie Ann M.D.   On: 10/15/2020 11:05   MR ANGIO NECK W WO CONTRAST Result Date: 10/15/2020 CLINICAL DATA:  70 year old female with punctate cortical infarct in the left MCA territory on MRI yesterday. EXAM: MRA NECK WITHOUT AND WITH CONTRAST TECHNIQUE: Multiplanar and multiecho pulse sequences of the neck were obtained without and with intravenous contrast. Angiographic images of the neck were obtained using MRA technique without and with intravenous contrast. CONTRAST:  5.47mL GADAVIST GADOBUTROL 1 MMOL/ML IV SOLN COMPARISON:  Head CT and brain MRI 10/14/2020. FINDINGS: Precontrast time-of-flight imaging demonstrates a 3 vessel arch configuration with antegrade flow in the bilateral cervical carotid and vertebral arteries. Antegrade flow continues to the skull base. Relatively codominant vertebrals. Postcontrast neck MRA imaging. Proximal great vessels are within normal limits. Normal right CCA. Right carotid bifurcation and cervical right ICA are within normal limits. Normal left CCA aside from mild tortuosity. Left carotid bifurcation and cervical right ICA are within normal limits. Normal proximal subclavian arteries and vertebral artery origins. Fairly codominant vertebral arteries appear normal to the skull base. Grossly normal visible postcontrast anterior and posterior circulation. IMPRESSION: Negative neck MRA. Electronically Signed   By: Genevie Ann M.D.   On: 10/15/2020 11:01   MR Brain Wo Contrast (neuro protocol) Result Date: 10/14/2020 CLINICAL  DATA:  Neuro deficit, acute, stroke suspected. Additional history provided: Patient developed slurred  speech lasting approximately 40 minutes, right arm numbness, right arm weakness, all now resolved except for persistent right arm weakness. EXAM: MRI HEAD WITHOUT CONTRAST TECHNIQUE: Multiplanar, multiecho pulse sequences of the brain and surrounding structures were obtained without intravenous contrast. COMPARISON:  Head CT 10/14/2020. FINDINGS: Brain: Mild generalized cerebral atrophy. Punctate acute cortical infarct within the posterior left frontal lobe (motor strip) (series 5, image 28) (series 7, image 16). Small chronic cortical infarcts within the left parietooccipital lobes with associated chronic hemosiderin deposition at this site. There are a few additional scattered supratentorial chronic microhemorrhages No evidence of an intracranial mass. No extra-axial fluid collection. No midline shift. Vascular: Maintained flow voids within the proximal large arterial vessels. Skull and upper cervical spine: Focal suspicious marrow lesion. Sinuses/Orbits: Visualized orbits show no acute finding. Bilateral lens replacements. Other: Trace fluid within the left mastoid air cells. Redemonstrated partially calcified right parietal scalp lesion, nonspecific but likely reflecting a cyst. IMPRESSION: Punctate acute cortical infarct within the posterior left frontal lobe (motor strip). Small chronic cortical infarcts within the left parietooccipital lobes with associated chronic hemosiderin deposition at this site. Mild generalized cerebral atrophy. Trace fluid within the left mastoid air cells. Electronically Signed   By: Kellie Simmering D.O.   On: 10/14/2020 14:52     OCT, Retina - OU - Both Eyes Result Date: 10/12/2020 Right Eye Quality was good. Scan locations included subfoveal. Central Foveal Thickness: 181. Findings include central retinal atrophy, outer retinal atrophy, inner retinal atrophy, abnormal foveal contour, disciform scar. Left Eye Quality was good. Scan locations included subfoveal. Central Foveal  Thickness: 307. Findings include abnormal foveal contour, disciform scar. Notes Diffuse retinal atrophy OD, stable over time with subfoveal scarring OS, stable with old disciform scar temporally.  No active maculopathy.  VAS US CAROTID Result Date: 10/15/2020 Carotid Arterial Duplex Study Patient Name:  MALEIGH BAGOT  Date of Exam:   10/15/2020 Medical Rec #: [id number]        Accession #:    [id number] Date of Birth: 16-Dec-1950       Patient Gender: F Patient Age:   70 years Exam Location:  [facility] Procedure:      VAS US CAROTID Referring Phys: York Cerise METZGER-CIHELKA --------------------------------------------------------------------------------  Indications:       CVA. Risk Factors:      Hypertension, hyperlipidemia, Diabetes I, coronary artery                    disease, PAD. Comparison Study:  No prior studies. Performing Technologist: Darlin Coco RDMS, RVT  Examination Guidelines: A complete evaluation includes B-mode imaging, spectral Doppler, color Doppler, and power Doppler as needed of all accessible portions of each vessel. Bilateral testing is considered an integral part of a complete examination. Limited examinations for reoccurring indications may be performed as noted.  Right Carotid  Findings:  Summary: Right Carotid: Velocities in the right ICA are consistent with a 1-39% stenosis. Left Carotid: Velocities in the left ICA are consistent with a 1-39% stenosis. Vertebrals:  Bilateral vertebral arteries demonstrate antegrade flow. Subclavians: Normal flow hemodynamics were seen in bilateral subclavian              arteries. *See table(s) above for measurements and observations.     Preliminary     12-lead ECG SR All prior EKG's in EPIC reviewed with no documented atrial fibrillation  Telemetry SR, infrequent PACs, PVCs  Assessment and Plan:  1. Cryptogenic stroke The patient presents with cryptogenic stroke.  Neurology has deferred TEE.   I spoke at length with the patient  about monitoring for afib with either a 30 day event monitor or an implantable loop recorder.  Risks, benefits, and alteratives to implantable loop recorder were discussed with the patient today.   At this time, the patient is very clear in their decision to proceed with implantable loop recorder.   Wound care was reviewed with the patient (keep incision clean and dry for 3 days).  Wound check follow up will be scheduled for the patient.  Please call with questions.   Baldwin Jamaica, PA-C 10/17/2020  Stroke  possibly cardioembolic  CAD s/p CABG  PerVasDisease  IDDM  Addisons  Hashimotos  For  loop recorder for afib detection  Reviewed role of device and the relationship to medication changes

## 2020-10-17 NOTE — TOC Transition Note (Signed)
Transition of Care [facility]) - CM/SW Discharge Note   Patient Details  Name: Kendra Walker MRN: [id number] Date of Birth: 05-May-1950  [facility] CM/SW Contact:  Pollie Friar, RN Phone Number: 10/17/2020, 10:13 AM   Clinical Narrative:    Patient is discharging home today. No needs per TOC.   Final next level of care: Home/Self Care Barriers to Discharge: No Barriers Identified   Patient Goals and CMS Choice        Discharge Placement                       Discharge Plan and Services                                     Social Determinants of Health (SDOH) Interventions     Readmission Risk Interventions No flowsheet data found.

## 2020-10-17 NOTE — Care Management Important Message (Signed)
Important Message  Patient Details  Name: Kendra Walker MRN: [id number] Date of Birth: 05-Aug-1950   Medicare Important Message Given:  Yes     Orbie Pyo 10/17/2020, 4:30 PM

## 2020-10-17 NOTE — Discharge Summary (Signed)
Physician Discharge Summary  Kendra Walker WPY:[id number] DOB: 1950/08/07 DOA: 10/14/2020  PCP: Curlene Labrum, MD  Admit date: 10/14/2020 Discharge date: 10/17/2020  Admitted From: Home Disposition: Home  Recommendations for Outpatient Follow-up:  Follow up with PCP in 1-2 weeks Follow-up with outpatient cardiology for loop recorder  Home Health:no  Equipment/Devices: none  Discharge Condition: Stable Code Status:   Code Status: Full Code Diet recommendation:  Diet Order             Diet Carb Modified Fluid consistency: Thin; Room service appropriate? Yes  Diet effective now                    Brief/Interim Summary: 70 year old female with diabetes, hypertension hyperlipidemia, PAD, hypothyroidism, CKD presented with slurred speech, blurred vision lasting for 2 minutes at 2 PM 10/14/2020 also had right arm numbness right arm weakness that is started earlier, she had a routine eye checkup and was told her that they thought she had a stroke and sent to the ED where she was reporting little bit of right arm weakness but resolution of all of her symptoms.   In the ED CT head negative, MRI "Punctate acute cortical infarct within the posterior left frontal lobe (motor strip). Small chronic cortical infarcts within the left parietooccipital lobes with associated chronic hemosiderin deposition at this site. Mild generalized cerebral atroph' blood pressure as high as 200/71 , neurology was consulted, admitted for a stroke work-up.  Seen by neurology Patient had HbA1c 8.0 LDL 67 no further work-up done with MRA angio head showed bilateral ICA siphon atherosclerosis up to moderate associated left ICA supraclinoid stenosis negative intracranial MRA, echocardiogram also ordered.  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  Normally on aspirin 81 mg at home.  Seen by PT OTno further need.  Renee from cardiology notified for loop recorder and placed prior to d//c today  CKD stage IIIb Metabolic acidosis: Renal function currently at baseline.  On sodium bicarb. Recent Labs  Lab 10/14/20 1325 10/15/20 0554 10/16/20 0504 10/16/20 1614 10/17/20 0433  BUN 45* 44* 42* 46* 39*  CREATININE 1.59* 1.78* 1.85* 2.49* 1.64*   Hyperkalemia: Resolved.  Repeat BMP in 1 week  Recent Labs  Lab 10/14/20 1325 10/15/20 0554 10/16/20 0504 10/16/20 1614 10/17/20 0433  K 5.4* 4.5 5.5* 4.9 4.2     CAD status post CABG PAD s/p bilateral common iliac artery stenting in 2021  HTN  HLD: BP is well controlled.  No chest pain.  No leg pain.  Continue on statins dual antiplatelets and home antihypertensives   T1DM with stable proliferative diabetic retinopathy on insulin pump A1c poorly controlled 8.0.  Blood sugar poorly controlled also with episodes of hypoglycemia.  Dc ssi. Patient reports this is not new and she gets alerted by her sensor and device during night when she gets hypoglycemic episodes like this at home and she drinks juice and she is well aware  about this and has been managing well at home Recent Labs  Lab 10/17/20 0018 10/17/20 0048 10/17/20 0428 10/17/20 0730 10/17/20 1222  GLUCAP 37* 120* 247* 171* 207*   Adrenal insufficiency Stable, continue her fludrocortisone and prednisone.  Hypothyroidism continue Synthroid.  TSH as outpatient  Diet Order              Diet Carb Modified Fluid consistency: Thin; Room service appropriate? Yes  Diet effective now                  Consults: neurology  Subjective: Alert awake oriented, right arm feels stronger today. Discharge Exam: Vitals:   10/17/20 0757 10/17/20 1220  BP: 128/64 (!) 130/49  Pulse: 75 76  Resp: 15 19  Temp: 98.2 F (36.8 C) 98.7 F (37.1 C)  SpO2: 97% 97%   General: Pt is alert, awake, not in acute distress Cardiovascular: RRR, S1/S2 +, no rubs, no gallops Respiratory: CTA bilaterally, no wheezing, no rhonchi Abdominal: Soft, NT, ND, bowel sounds + Extremities: no edema, no cyanosis  Discharge Instructions  Discharge Instructions     Ambulatory referral to Neurology   Complete by: As directed    An appointment is requested in approximately: 2 weeks   Discharge instructions   Complete by: As directed    Bmp in 1 wk Pcp  in 1 wk Neurology for stroke follow up Cardiology for loop recorder  Please call call MD or return to ER for similar or worsening recurring problem that brought you to hospital or if any fever,nausea/vomiting,abdominal pain, uncontrolled pain, chest pain,  shortness of breath or any other alarming symptoms.  Please follow-up your doctor as instructed in a week time and call the office for appointment.  Please avoid alcohol, smoking, or any other illicit substance and maintain healthy habits including taking your regular medications as prescribed.  You were cared for by a hospitalist during your hospital stay. If you have any questions about your discharge medications or the care you received while you were in the hospital after you are discharged, you can call the unit and ask to speak with the hospitalist on call if the hospitalist that took care of you is not available.  Once you are discharged, your primary care physician will handle any further medical issues. Please note that NO REFILLS for any discharge medications will be authorized once  you are discharged, as it is imperative that you return to your primary care physician (or establish a relationship with a primary care physician if you do not have one) for your aftercare needs so that they can reassess your need for medications and monitor your lab values.  Check blood sugar 3 times a day and bedtime at home-or as per your continuous glucose monitoring sensor. If blood sugar running above 200 less than 70 please call your MD to adjust insulin. If blood sugars running less 100 do not use insulin and call MD. If you noticed signs and symptoms of hypoglycemia or low blood sugar like jitteriness, confusion, thirst, tremor, sweating- Check blood sugar, drink sugary drink/biscuits/sweets to increase sugar level and call MD or return to ER.   Increase activity slowly   Complete by: As directed       Allergies as of 10/17/2020       Reactions   Bactrim [sulfamethoxazole-trimethoprim] Other (See Comments)   Elevates potassium.    Tape Other (See Comments)   not allergic but causes sensitivity.   Fluorescein Rash   Procrit [  epoetin (alfa)] Rash        Medication List     TAKE these medications    acetaminophen 500 MG tablet Commonly known as: TYLENOL Take 500 mg by mouth every 6 (six) hours as needed for mild pain or headache.   aspirin EC 81 MG tablet Take 81 mg by mouth daily.   atorvastatin 80 MG tablet Commonly known as: LIPITOR Take 1 tablet (80 mg total) by mouth daily. What changed: when to take this   CALCIUM 600 PO Take 600 mg by mouth in the morning and at bedtime.   clopidogrel 75 MG tablet Commonly known as: PLAVIX Take 1 tablet (75 mg total) by mouth daily for 19 days.   Fish Oil 1200 MG Caps Take 1,200 mg by mouth daily.   fludrocortisone 0.1 MG tablet Commonly known as: FLORINEF Take 0.05 mg by mouth daily.   GlucaGen HypoKit 1 MG Solr injection Generic drug: glucagon Inject into the muscle.   HumaLOG 100 UNIT/ML injection Generic  drug: insulin lispro Inject into the skin See admin instructions. VIA INSULIN PUMP   insulin pump Soln Inject into the skin. HUMALOG 100 UNIT/ML   iron polysaccharides 150 MG capsule Commonly known as: NIFEREX Take 150 mg by mouth at bedtime.   levothyroxine 112 MCG tablet Commonly known as: SYNTHROID Take 112 mcg by mouth daily.   magnesium chloride 64 MG Tbec SR tablet Commonly known as: SLOW-MAG Take 1-2 tablets by mouth See admin instructions. TAKE 1 TABLET IN THE MORNING & TAKE 2 TABLETS AT NIGHT.   multivitamin with minerals Tabs tablet Take 1 tablet by mouth daily.   Poly-Iron 150 Forte 150-0.025-1 MG Caps Generic drug: Iron Polysacch [id number]-B12-FA Take 1 capsule by mouth daily at 6 (six) AM.   predniSONE 5 MG tablet Commonly known as: DELTASONE Take 5 mg by mouth daily with breakfast.   PRESERVISION AREDS 2 PO Take 1 tablet by mouth daily.   sodium bicarbonate 650 MG tablet Take 650 mg by mouth in the morning and at bedtime.   vitamin C with rose hips 500 MG tablet Take 500 mg by mouth at bedtime.   Vitamin D3 50 MCG (2000 UT) Tabs Take 2,000 Units by mouth daily.        Follow-up Information     Burdine, Virgina Evener, MD Follow up in 1 week(s).   Specialty: Family Medicine Contact information: [place] [address] [phone]         Arnoldo Lenis, MD .   Specialty: Cardiology Contact information: [address] [place] [address] [phone]         Baldwin Jamaica, PA-C Follow up.   Specialty: Cardiology Why: 10/27/20 @ 1:15PM, wound check visit (heart monitor/loop recorder) Contact information: [address] [place] [address] [phone]                Allergies  Allergen Reactions   Bactrim [Sulfamethoxazole-Trimethoprim] Other (See Comments)    Elevates potassium.    Tape Other (See Comments)    not allergic but causes sensitivity.   Fluorescein Rash   Procrit [Epoetin (Alfa)] Rash     The results of significant diagnostics from this hospitalization (including imaging, microbiology, ancillary and laboratory) are listed below for reference.    Microbiology: Recent Results (from the past 240 hour(s))  Resp Panel by RT-PCR (Flu A&B, Covid) Nasopharyngeal Swab     Status: None   Collection Time: 10/14/20 11:15 AM  Specimen: Nasopharyngeal Swab; Nasopharyngeal(NP) swabs in vial transport medium  Result Value Ref Range Status   SARS Coronavirus 2 by RT PCR NEGATIVE NEGATIVE Final    Comment: (NOTE) SARS-CoV-2 target nucleic acids are NOT DETECTED.  The SARS-CoV-2 RNA is generally detectable in upper respiratory specimens during the acute phase of infection. The lowest concentration of SARS-CoV-2 viral copies this assay can detect is 138 copies/mL. A negative result does not preclude SARS-Cov-2 infection and should not be used as the sole basis for treatment or other patient management decisions. A negative result may occur with  improper specimen collection/handling, submission of specimen other than nasopharyngeal swab, presence of viral mutation(s) within the areas targeted by this assay, and inadequate number of viral copies(<138 copies/mL). A negative result must be combined with clinical observations, patient history, and epidemiological information. The expected result is Negative.  Fact Sheet for Patients:  EntrepreneurPulse.com.au  Fact Sheet for Healthcare Providers:  IncredibleEmployment.be  This test is no t yet approved or cleared by the [place] FDA and  has been authorized for detection and/or diagnosis of SARS-CoV-2 by FDA under an Emergency Use Authorization (EUA). This EUA will remain  in effect (meaning this test can be used) for the duration of the COVID-19 declaration under Section 564(b)(1) of the Act, 21 U.S.C.section 360bbb-3(b)(1), unless the authorization is terminated  or revoked sooner.        Influenza A by PCR NEGATIVE NEGATIVE Final   Influenza B by PCR NEGATIVE NEGATIVE Final    Comment: (NOTE) The Xpert Xpress SARS-CoV-2/FLU/RSV plus assay is intended as an aid in the diagnosis of influenza from Nasopharyngeal swab specimens and should not be used as a sole basis for treatment. Nasal washings and aspirates are unacceptable for Xpert Xpress SARS-CoV-2/FLU/RSV testing.  Fact Sheet for Patients: EntrepreneurPulse.com.au  Fact Sheet for Healthcare Providers: IncredibleEmployment.be  This test is not yet approved or cleared by the [place] FDA and has been authorized for detection and/or diagnosis of SARS-CoV-2 by FDA under an Emergency Use Authorization (EUA). This EUA will remain in effect (meaning this test can be used) for the duration of the COVID-19 declaration under Section 564(b)(1) of the Act, 21 U.S.C. section 360bbb-3(b)(1), unless the authorization is terminated or revoked.  Performed at [facility], [address], [place], [place] [address]     Procedures/Studies: CT HEAD WO CONTRAST  Result Date: 10/14/2020 CLINICAL DATA:  Slurred speech, right arm numbness EXAM: CT HEAD WITHOUT CONTRAST TECHNIQUE: Contiguous axial images were obtained from the base of the skull through the vertex without intravenous contrast. COMPARISON:  None. FINDINGS: Brain: There is no evidence of acute intracranial hemorrhage, extra-axial fluid collection, or acute infarct. The ventricles are normal in size. There is no mass lesion. There is no midline shift. There is no significant burden of chronic white matter microangiopathy. Vascular: There is dense calcification of the bilateral cavernous ICAs. Skull: Normal. Negative for fracture or focal lesion. Sinuses/Orbits: The imaged paranasal sinuses are clear. Bilateral lens implants are in place. The globes and orbits are otherwise unremarkable. Other: There is a 1.3 cm partially calcified nodule  in the right frontoparietal scalp. IMPRESSION: 1. No acute intracranial pathology. 2. 1.3 cm partially calcified in the right frontoparietal scalp is indeterminate. Correlate with physical exam. Electronically Signed   By: Valetta Mole M.D.   On: 10/14/2020 12:13   MR ANGIO HEAD WO CONTRAST  Result Date: 10/15/2020 CLINICAL DATA:  70 year old female with punctate cortical infarct in the left MCA territory  on MRI yesterday. EXAM: MRA HEAD WITHOUT CONTRAST TECHNIQUE: Angiographic images of the Circle of Willis were acquired using MRA technique without intravenous contrast. COMPARISON:  Brain MRI yesterday.  Neck MRA today. FINDINGS: No intracranial mass effect or ventriculomegaly. Codominant distal vertebral arteries with antegrade flow. Patent basilar artery with no distal vertebral or basilar stenosis. Patent PICA origins, SCA, and PCA origins. Both posterior communicating arteries are present. Bilateral PCA branches are within normal limits. Antegrade flow in both ICA siphons. Evidence of calcified bilateral ICA atherosclerosis. This most affects the junctions of the petrous and cavernous segments and supraclinoid segments with mild to moderate left supraclinoid stenosis on series 253, image 13. Mild stenosis otherwise. Posterior communicating artery and right ophthalmic artery origins are normal. Patent carotid termini, MCA and ACA origins. Anterior communicating artery and visible ACA branches are within normal limits. Bilateral MCA M1 segments, MCA bi/trifurcations, and visible bilateral MCA branches are within normal limits. IMPRESSION: 1. Bilateral ICA siphon atherosclerosis. Up to moderate associated Left ICA supraclinoid stenosis. 2. But otherwise negative intracranial MRA. Electronically Signed   By: Genevie Ann M.D.   On: 10/15/2020 11:05   MR ANGIO NECK W WO CONTRAST  Result Date: 10/15/2020 CLINICAL DATA:  70 year old female with punctate cortical infarct in the left MCA territory on MRI yesterday.  EXAM: MRA NECK WITHOUT AND WITH CONTRAST TECHNIQUE: Multiplanar and multiecho pulse sequences of the neck were obtained without and with intravenous contrast. Angiographic images of the neck were obtained using MRA technique without and with intravenous contrast. CONTRAST:  5.55mL GADAVIST GADOBUTROL 1 MMOL/ML IV SOLN COMPARISON:  Head CT and brain MRI 10/14/2020. FINDINGS: Precontrast time-of-flight imaging demonstrates a 3 vessel arch configuration with antegrade flow in the bilateral cervical carotid and vertebral arteries. Antegrade flow continues to the skull base. Relatively codominant vertebrals. Postcontrast neck MRA imaging. Proximal great vessels are within normal limits. Normal right CCA. Right carotid bifurcation and cervical right ICA are within normal limits. Normal left CCA aside from mild tortuosity. Left carotid bifurcation and cervical right ICA are within normal limits. Normal proximal subclavian arteries and vertebral artery origins. Fairly codominant vertebral arteries appear normal to the skull base. Grossly normal visible postcontrast anterior and posterior circulation. IMPRESSION: Negative neck MRA. Electronically Signed   By: Genevie Ann M.D.   On: 10/15/2020 11:01   MR Brain Wo Contrast (neuro protocol)  Result Date: 10/14/2020 CLINICAL DATA:  Neuro deficit, acute, stroke suspected. Additional history provided: Patient developed slurred speech lasting approximately 40 minutes, right arm numbness, right arm weakness, all now resolved except for persistent right arm weakness. EXAM: MRI HEAD WITHOUT CONTRAST TECHNIQUE: Multiplanar, multiecho pulse sequences of the brain and surrounding structures were obtained without intravenous contrast. COMPARISON:  Head CT 10/14/2020. FINDINGS: Brain: Mild generalized cerebral atrophy. Punctate acute cortical infarct within the posterior left frontal lobe (motor strip) (series 5, image 28) (series 7, image 16). Small chronic cortical infarcts within the  left parietooccipital lobes with associated chronic hemosiderin deposition at this site. There are a few additional scattered supratentorial chronic microhemorrhages No evidence of an intracranial mass. No extra-axial fluid collection. No midline shift. Vascular: Maintained flow voids within the proximal large arterial vessels. Skull and upper cervical spine: Focal suspicious marrow lesion. Sinuses/Orbits: Visualized orbits show no acute finding. Bilateral lens replacements. Other: Trace fluid within the left mastoid air cells. Redemonstrated partially calcified right parietal scalp lesion, nonspecific but likely reflecting a cyst. IMPRESSION: Punctate acute cortical infarct within the posterior left frontal lobe (motor strip).  Small chronic cortical infarcts within the left parietooccipital lobes with associated chronic hemosiderin deposition at this site. Mild generalized cerebral atrophy. Trace fluid within the left mastoid air cells. Electronically Signed   By: Kellie Simmering D.O.   On: 10/14/2020 14:52   ECHOCARDIOGRAM COMPLETE  Result Date: 10/16/2020    ECHOCARDIOGRAM REPORT   Patient Name:   JAQUETTA CURRIER Date of Exam: 10/16/2020 Medical Rec #:  [id number]       Height:       62.0 in Accession #:    [id number]      Weight:       124.3 lb Date of Birth:  November 09, 1950      BSA:          1.562 m Patient Age:    87 years        BP:           131/72 mmHg Patient Gender: F               HR:           72 bpm. Exam Location:  Inpatient Procedure: 2D Echo Indications:    stroke  History:        Patient has prior history of Echocardiogram examinations, most                 recent 09/20/2016. CAD, PAD and chronic kidney disease; Risk                 Factors:Hypertension and Diabetes.  Sonographer:    Johny Chess RDCS Referring Phys: [place]  1. Left ventricular ejection fraction, by estimation, is 60 to 65%. The left ventricle has normal function. The left ventricle has no regional wall  motion abnormalities. Left ventricular diastolic parameters are consistent with Grade I diastolic dysfunction (impaired relaxation).  2. Right ventricular systolic function is normal. The right ventricular size is normal. There is normal pulmonary artery systolic pressure.  3. The mitral valve is normal in structure. Trivial mitral valve regurgitation. No evidence of mitral stenosis.  4. The aortic valve is calcified. There is moderate calcification of the aortic valve. There is moderate thickening of the aortic valve. Aortic valve regurgitation is trivial. Mild to moderate aortic valve sclerosis/calcification is present, without any  evidence of aortic stenosis.  5. The inferior vena cava is normal in size with greater than 50% respiratory variability, suggesting right atrial pressure of 3 mmHg. Conclusion(s)/Recommendation(s): No intracardiac source of embolism detected on this transthoracic study. A transesophageal echocardiogram is recommended to exclude cardiac source of embolism if clinically indicated. FINDINGS  Left Ventricle: Left ventricular ejection fraction, by estimation, is 60 to 65%. The left ventricle has normal function. The left ventricle has no regional wall motion abnormalities. The left ventricular internal cavity size was normal in size. There is  no left ventricular hypertrophy. Left ventricular diastolic parameters are consistent with Grade I diastolic dysfunction (impaired relaxation). Right Ventricle: The right ventricular size is normal. No increase in right ventricular wall thickness. Right ventricular systolic function is normal. There is normal pulmonary artery systolic pressure. The tricuspid regurgitant velocity is 1.68 m/s, and  with an assumed right atrial pressure of 3 mmHg, the estimated right ventricular systolic pressure is 62.9 mmHg. Left Atrium: Left atrial size was normal in size. Right Atrium: Right atrial size was normal in size. Pericardium: There is no evidence of  pericardial effusion. Mitral Valve: The mitral valve is normal in structure. Trivial mitral valve  regurgitation. No evidence of mitral valve stenosis. Tricuspid Valve: The tricuspid valve is normal in structure. Tricuspid valve regurgitation is trivial. No evidence of tricuspid stenosis. Aortic Valve: The aortic valve is calcified. There is moderate calcification of the aortic valve. There is moderate thickening of the aortic valve. Aortic valve regurgitation is trivial. Mild to moderate aortic valve sclerosis/calcification is present, without any evidence of aortic stenosis. Pulmonic Valve: The pulmonic valve was normal in structure. Pulmonic valve regurgitation is not visualized. No evidence of pulmonic stenosis. Aorta: The aortic root is normal in size and structure. Venous: The inferior vena cava is normal in size with greater than 50% respiratory variability, suggesting right atrial pressure of 3 mmHg. IAS/Shunts: No atrial level shunt detected by color flow Doppler.  LEFT VENTRICLE PLAX 2D LVIDd:         4.20 cm LVIDs:         2.40 cm LV PW:         0.80 cm LV IVS:        0.70 cm LVOT diam:     1.50 cm LV SV:         45 LV SV Index:   29 LVOT Area:     1.77 cm  RIGHT VENTRICLE         IVC TAPSE (M-mode): 1.3 cm  IVC diam: 1.20 cm LEFT ATRIUM             Index       RIGHT ATRIUM           Index LA diam:        2.90 cm 1.86 cm/m  RA Area:     10.40 cm LA Vol (A2C):   35.6 ml 22.79 ml/m RA Volume:   22.20 ml  14.21 ml/m LA Vol (A4C):   32.9 ml 21.06 ml/m LA Biplane Vol: 35.2 ml 22.53 ml/m  AORTIC VALVE LVOT Vmax:   122.00 cm/s LVOT Vmean:  82.000 cm/s LVOT VTI:    0.253 m  AORTA Ao Root diam: 2.50 cm Ao Asc diam:  3.00 cm MITRAL VALVE                TRICUSPID VALVE MV Area (PHT): 2.77 cm     TR Peak grad:   11.3 mmHg MV Decel Time: 274 msec     TR Vmax:        168.00 cm/s MV E velocity: 108.00 cm/s MV A velocity: 105.00 cm/s  SHUNTS MV E/A ratio:  1.03         Systemic VTI:  0.25 m                              Systemic Diam: 1.50 cm Candee Furbish MD Electronically signed by Candee Furbish MD Signature Date/Time: 10/16/2020/12:12:43 PM    Final    OCT, Retina - OU - Both Eyes  Result Date: 10/12/2020 Right Eye Quality was good. Scan locations included subfoveal. Central Foveal Thickness: 181. Findings include central retinal atrophy, outer retinal atrophy, inner retinal atrophy, abnormal foveal contour, disciform scar. Left Eye Quality was good. Scan locations included subfoveal. Central Foveal Thickness: 307. Findings include abnormal foveal contour, disciform scar. Notes Diffuse retinal atrophy OD, stable over time with subfoveal scarring OS, stable with old disciform scar temporally.  No active maculopathy.  VAS US CAROTID  Result Date: 10/17/2020 Carotid Arterial Duplex Study Patient Name:  Aris Georgia  Date of Exam:  10/15/2020 Medical Rec #: [id number]        Accession #:    [id number] Date of Birth: [birth year]       Patient Gender: F Patient Age:   24 years Exam [facility] Procedure:      VAS US CAROTID Referring Phys: [place] Cerise METZGER-CIHELKA --------------------------------------------------------------------------------  Indications:       CVA. Risk Factors:      Hypertension, hyperlipidemia, Diabetes I, coronary artery                    disease, PAD. Comparison Study:  No prior studies. Performing Technologist: Darlin Coco RDMS, RVT  Examination Guidelines: A complete evaluation includes B-mode imaging, spectral Doppler, color Doppler, and power Doppler as needed of all accessible portions of each vessel. Bilateral testing is considered an integral part of a complete examination. Limited examinations for reoccurring indications may be performed as noted.  Right Carotid Findings: +----------+--------+--------+--------+------------------+--------+           PSV cm/sEDV cm/sStenosisPlaque DescriptionComments +----------+--------+--------+--------+------------------+--------+  CCA Prox  75      9                                          +----------+--------+--------+--------+------------------+--------+ CCA Distal48      10                                         +----------+--------+--------+--------+------------------+--------+ ICA Prox  43      13      1-39%   heterogenous               +----------+--------+--------+--------+------------------+--------+ ICA Distal101     33                                         +----------+--------+--------+--------+------------------+--------+ ECA       68                      heterogenous               +----------+--------+--------+--------+------------------+--------+ +----------+--------+-------+----------------+-------------------+           PSV cm/sEDV cmsDescribe        Arm Pressure (mmHG) +----------+--------+-------+----------------+-------------------+ Subclavian119            Multiphasic, WNL                    +----------+--------+-------+----------------+-------------------+ +---------+--------+--+--------+--+---------+ VertebralPSV cm/s52EDV cm/s10Antegrade +---------+--------+--+--------+--+---------+  Left Carotid Findings: +----------+--------+--------+--------+------------------+--------+           PSV cm/sEDV cm/sStenosisPlaque DescriptionComments +----------+--------+--------+--------+------------------+--------+ CCA Prox  92      10                                         +----------+--------+--------+--------+------------------+--------+ CCA Distal45      9                                          +----------+--------+--------+--------+------------------+--------+ ICA Prox  36  15              heterogenous               +----------+--------+--------+--------+------------------+--------+ ICA Distal75      22                                         +----------+--------+--------+--------+------------------+--------+ ECA       66                       heterogenous               +----------+--------+--------+--------+------------------+--------+ +----------+--------+--------+----------------+-------------------+           PSV cm/sEDV cm/sDescribe        Arm Pressure (mmHG) +----------+--------+--------+----------------+-------------------+ [id number]             Multiphasic, WNL                    +----------+--------+--------+----------------+-------------------+ +---------+--------+--+--------+--+---------+ VertebralPSV cm/s67EDV cm/s16Antegrade +---------+--------+--+--------+--+---------+   Summary: Right Carotid: Velocities in the right ICA are consistent with a 1-39% stenosis. Left Carotid: Velocities in the left ICA are consistent with a 1-39% stenosis. Vertebrals:  Bilateral vertebral arteries demonstrate antegrade flow. Subclavians: Normal flow hemodynamics were seen in bilateral subclavian              arteries. *See table(s) above for measurements and observations.  Electronically signed by Antony Contras MD on 10/17/2020 at 12:43:35 PM.    Final     Labs: BNP (last 3 results) No results for input(s): BNP in the last 8760 hours. Basic Metabolic Panel: Recent Labs  Lab 10/14/20 1325 10/15/20 0554 10/16/20 0504 10/16/20 1614 10/17/20 0433  NA 137 137 135 137 136  K 5.4* 4.5 5.5* 4.9 4.2  CL 100 101 97* 100 101  CO2 30 28 30 30 26   GLUCOSE 194* 215* 383* 61* 234*  BUN 45* 44* 42* 46* 39*  CREATININE 1.59* 1.78* 1.85* 2.49* 1.64*  CALCIUM 9.5 9.3 9.2 9.4 8.4*   Liver Function Tests: Recent Labs  Lab 10/14/20 1325  AST 28  ALT 23  ALKPHOS 55  BILITOT 0.8  PROT 6.5  ALBUMIN 3.8   No results for input(s): LIPASE, AMYLASE in the last 168 hours. No results for input(s): AMMONIA in the last 168 hours. CBC: Recent Labs  Lab 10/14/20 1220 10/14/20 1226  WBC 9.6  --   NEUTROABS 6.5  --   HGB 14.1 15.3*  HCT 43.0 45.0  MCV 95.3  --   PLT 278  --    Cardiac Enzymes: No results for  input(s): CKTOTAL, CKMB, CKMBINDEX, TROPONINI in the last 168 hours. BNP: Invalid input(s): POCBNP CBG: Recent Labs  Lab 10/17/20 0018 10/17/20 0048 10/17/20 0428 10/17/20 0730 10/17/20 1222  GLUCAP 37* 120* 247* 171* 207*   D-Dimer No results for input(s): DDIMER in the last 72 hours. Hgb A1c Recent Labs    10/15/20 0554  HGBA1C 8.0*   Lipid Profile Recent Labs    10/15/20 0554  CHOL 154  HDL 76  LDLCALC 67  TRIG 55  CHOLHDL 2.0   Thyroid function studies No results for input(s): TSH, T4TOTAL, T3FREE, THYROIDAB in the last 72 hours.  Invalid input(s): FREET3 Anemia work up No results for input(s): VITAMINB12, FOLATE, FERRITIN, TIBC, IRON, RETICCTPCT in the last 72 hours. Urinalysis    Component Value Date/Time  COLORURINE YELLOW 10/14/2020 1405   APPEARANCEUR CLOUDY (A) 10/14/2020 1405   LABSPEC 1.012 10/14/2020 1405   PHURINE 6.0 10/14/2020 1405   GLUCOSEU NEGATIVE 10/14/2020 1405   HGBUR NEGATIVE 10/14/2020 1405   [place] 10/14/2020 1405   KETONESUR 5 (A) 10/14/2020 1405   PROTEINUR NEGATIVE 10/14/2020 1405   NITRITE NEGATIVE 10/14/2020 1405   LEUKOCYTESUR LARGE (A) 10/14/2020 1405   Sepsis Labs Invalid input(s): PROCALCITONIN,  WBC,  LACTICIDVEN Microbiology Recent Results (from the past 240 hour(s))  Resp Panel by RT-PCR (Flu A&B, Covid) Nasopharyngeal Swab     Status: None   Collection Time: 10/14/20 11:15 AM   Specimen: Nasopharyngeal Swab; Nasopharyngeal(NP) swabs in vial transport medium  Result Value Ref Range Status   SARS Coronavirus 2 by RT PCR NEGATIVE NEGATIVE Final    Comment: (NOTE) SARS-CoV-2 target nucleic acids are NOT DETECTED.  The SARS-CoV-2 RNA is generally detectable in upper respiratory specimens during the acute phase of infection. The lowest concentration of SARS-CoV-2 viral copies this assay can detect is 138 copies/mL. A negative result does not preclude SARS-Cov-2 infection and should not be used as the  sole basis for treatment or other patient management decisions. A negative result may occur with  improper specimen collection/handling, submission of specimen other than nasopharyngeal swab, presence of viral mutation(s) within the areas targeted by this assay, and inadequate number of viral copies(<138 copies/mL). A negative result must be combined with clinical observations, patient history, and epidemiological information. The expected result is Negative.  Fact Sheet for Patients:  EntrepreneurPulse.com.au  Fact Sheet for Healthcare Providers:  IncredibleEmployment.be  This test is no t yet approved or cleared by the [place] FDA and  has been authorized for detection and/or diagnosis of SARS-CoV-2 by FDA under an Emergency Use Authorization (EUA). This EUA will remain  in effect (meaning this test can be used) for the duration of the COVID-19 declaration under Section 564(b)(1) of the Act, 21 U.S.C.section 360bbb-3(b)(1), unless the authorization is terminated  or revoked sooner.       Influenza A by PCR NEGATIVE NEGATIVE Final   Influenza B by PCR NEGATIVE NEGATIVE Final    Comment: (NOTE) The Xpert Xpress SARS-CoV-2/FLU/RSV plus assay is intended as an aid in the diagnosis of influenza from Nasopharyngeal swab specimens and should not be used as a sole basis for treatment. Nasal washings and aspirates are unacceptable for Xpert Xpress SARS-CoV-2/FLU/RSV testing.  Fact Sheet for Patients: EntrepreneurPulse.com.au  Fact Sheet for Healthcare Providers: IncredibleEmployment.be  This test is not yet approved or cleared by the [place] FDA and has been authorized for detection and/or diagnosis of SARS-CoV-2 by FDA under an Emergency Use Authorization (EUA). This EUA will remain in effect (meaning this test can be used) for the duration of the COVID-19 declaration under Section 564(b)(1) of the  Act, 21 U.S.C. section 360bbb-3(b)(1), unless the authorization is terminated or revoked.  Performed at [facility], [address], [place], [place] [address]      Time coordinating discharge: 35 minutes  SIGNED: Antonieta Pert, MD  [facility] 10/17/2020, 3:32 PM  If 7PM-7AM, please contact night-coverage [email or web address]

## 2020-10-17 NOTE — Plan of Care (Signed)
  Problem: Education: Goal: Knowledge of disease or condition will improve Outcome: Adequate for Discharge Goal: Knowledge of secondary prevention will improve Outcome: Adequate for Discharge Goal: Knowledge of patient specific risk factors addressed and post discharge goals established will improve Outcome: Adequate for Discharge Goal: Individualized Educational Video(s) Outcome: Adequate for Discharge   Problem: Coping: Goal: Will verbalize positive feelings about self Outcome: Adequate for Discharge Goal: Will identify appropriate support needs Outcome: Adequate for Discharge   Problem: Health Behavior/Discharge Planning: Goal: Ability to manage health-related needs will improve Outcome: Adequate for Discharge   Problem: Education: Goal: Knowledge of General Education information will improve Description: Including pain rating scale, medication(s)/side effects and non-pharmacologic comfort measures Outcome: Adequate for Discharge   Problem: Health Behavior/Discharge Planning: Goal: Ability to manage health-related needs will improve Outcome: Adequate for Discharge   Problem: Clinical Measurements: Goal: Ability to maintain clinical measurements within normal limits will improve Outcome: Adequate for Discharge Goal: Will remain free from infection Outcome: Adequate for Discharge Goal: Diagnostic test results will improve Outcome: Adequate for Discharge Goal: Respiratory complications will improve Outcome: Adequate for Discharge Goal: Cardiovascular complication will be avoided Outcome: Adequate for Discharge   Problem: Activity: Goal: Risk for activity intolerance will decrease Outcome: Adequate for Discharge   Problem: Nutrition: Goal: Adequate nutrition will be maintained Outcome: Adequate for Discharge   Problem: Coping: Goal: Level of anxiety will decrease Outcome: Adequate for Discharge   Problem: Elimination: Goal: Will not experience complications  related to bowel motility Outcome: Adequate for Discharge Goal: Will not experience complications related to urinary retention Outcome: Adequate for Discharge   Problem: Pain Managment: Goal: General experience of comfort will improve Outcome: Adequate for Discharge   Problem: Safety: Goal: Ability to remain free from injury will improve Outcome: Adequate for Discharge   Problem: Skin Integrity: Goal: Risk for impaired skin integrity will decrease Outcome: Adequate for Discharge

## 2020-10-18 ENCOUNTER — Encounter (HOSPITAL_COMMUNITY): Payer: Self-pay | Admitting: Internal Medicine

## 2020-10-18 DIAGNOSIS — Z7982 Long term (current) use of aspirin: Secondary | ICD-10-CM | POA: Diagnosis not present

## 2020-10-18 DIAGNOSIS — E271 Primary adrenocortical insufficiency: Secondary | ICD-10-CM | POA: Diagnosis not present

## 2020-10-18 DIAGNOSIS — Z794 Long term (current) use of insulin: Secondary | ICD-10-CM | POA: Diagnosis not present

## 2020-10-18 DIAGNOSIS — Z79890 Hormone replacement therapy (postmenopausal): Secondary | ICD-10-CM | POA: Diagnosis not present

## 2020-10-18 DIAGNOSIS — Z79899 Other long term (current) drug therapy: Secondary | ICD-10-CM | POA: Diagnosis not present

## 2020-10-18 DIAGNOSIS — Z9641 Presence of insulin pump (external) (internal): Secondary | ICD-10-CM | POA: Diagnosis not present

## 2020-10-18 DIAGNOSIS — Z7952 Long term (current) use of systemic steroids: Secondary | ICD-10-CM | POA: Diagnosis not present

## 2020-10-18 DIAGNOSIS — E039 Hypothyroidism, unspecified: Secondary | ICD-10-CM | POA: Diagnosis not present

## 2020-10-18 DIAGNOSIS — I10 Essential (primary) hypertension: Secondary | ICD-10-CM | POA: Diagnosis not present

## 2020-10-18 DIAGNOSIS — E109 Type 1 diabetes mellitus without complications: Secondary | ICD-10-CM | POA: Diagnosis not present

## 2020-10-19 ENCOUNTER — Telehealth: Payer: Self-pay

## 2020-10-19 NOTE — Telephone Encounter (Signed)
LMOVM for the patient to return device clinic call. I need her to open mobile phone app and hit the "got its". Once she do that the mobile apps should work.

## 2020-10-19 NOTE — Telephone Encounter (Signed)
Spoke with patient and have walked her through how to fix the app. The app is now fixed

## 2020-10-24 DIAGNOSIS — D631 Anemia in chronic kidney disease: Secondary | ICD-10-CM | POA: Diagnosis not present

## 2020-10-24 DIAGNOSIS — E782 Mixed hyperlipidemia: Secondary | ICD-10-CM | POA: Diagnosis not present

## 2020-10-24 DIAGNOSIS — E1121 Type 2 diabetes mellitus with diabetic nephropathy: Secondary | ICD-10-CM | POA: Diagnosis not present

## 2020-10-24 DIAGNOSIS — E7849 Other hyperlipidemia: Secondary | ICD-10-CM | POA: Diagnosis not present

## 2020-10-24 DIAGNOSIS — N189 Chronic kidney disease, unspecified: Secondary | ICD-10-CM | POA: Diagnosis not present

## 2020-10-26 DIAGNOSIS — N184 Chronic kidney disease, stage 4 (severe): Secondary | ICD-10-CM | POA: Diagnosis not present

## 2020-10-26 DIAGNOSIS — I6930 Unspecified sequelae of cerebral infarction: Secondary | ICD-10-CM | POA: Diagnosis not present

## 2020-10-26 DIAGNOSIS — E1021 Type 1 diabetes mellitus with diabetic nephropathy: Secondary | ICD-10-CM | POA: Diagnosis not present

## 2020-10-26 DIAGNOSIS — E039 Hypothyroidism, unspecified: Secondary | ICD-10-CM | POA: Diagnosis not present

## 2020-10-26 DIAGNOSIS — I10 Essential (primary) hypertension: Secondary | ICD-10-CM | POA: Diagnosis not present

## 2020-10-26 DIAGNOSIS — E7849 Other hyperlipidemia: Secondary | ICD-10-CM | POA: Diagnosis not present

## 2020-10-26 DIAGNOSIS — Z23 Encounter for immunization: Secondary | ICD-10-CM | POA: Diagnosis not present

## 2020-10-26 DIAGNOSIS — J8410 Pulmonary fibrosis, unspecified: Secondary | ICD-10-CM | POA: Diagnosis not present

## 2020-10-27 ENCOUNTER — Ambulatory Visit (INDEPENDENT_AMBULATORY_CARE_PROVIDER_SITE_OTHER): Payer: Medicare Other | Admitting: Physician Assistant

## 2020-10-27 ENCOUNTER — Encounter: Payer: Self-pay | Admitting: Physician Assistant

## 2020-10-27 ENCOUNTER — Encounter: Payer: Self-pay | Admitting: Cardiology

## 2020-10-27 ENCOUNTER — Ambulatory Visit (INDEPENDENT_AMBULATORY_CARE_PROVIDER_SITE_OTHER): Payer: Medicare Other | Admitting: Cardiology

## 2020-10-27 ENCOUNTER — Other Ambulatory Visit: Payer: Self-pay

## 2020-10-27 VITALS — BP 134/64 | HR 80 | Ht 62.0 in | Wt 124.6 lb

## 2020-10-27 VITALS — BP 134/62 | HR 85 | Ht 62.0 in | Wt 124.4 lb

## 2020-10-27 DIAGNOSIS — I779 Disorder of arteries and arterioles, unspecified: Secondary | ICD-10-CM

## 2020-10-27 DIAGNOSIS — Z95818 Presence of other cardiac implants and grafts: Secondary | ICD-10-CM | POA: Diagnosis not present

## 2020-10-27 DIAGNOSIS — Z5189 Encounter for other specified aftercare: Secondary | ICD-10-CM | POA: Diagnosis not present

## 2020-10-27 DIAGNOSIS — E782 Mixed hyperlipidemia: Secondary | ICD-10-CM | POA: Diagnosis not present

## 2020-10-27 DIAGNOSIS — I2510 Atherosclerotic heart disease of native coronary artery without angina pectoris: Secondary | ICD-10-CM

## 2020-10-27 NOTE — Progress Notes (Signed)
Clinical Summary Ms. Stankiewicz is a 70 y.o.female seen today for follow up of the following medical problems.    1. CAD -  previously followed by [facility]. 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   - LVEF March 2011 reported normal LVEF. Echo 04/2011 LVEF 55-60%. - 12/2014 Lexiscan without ischemia - 08/2016 echo LVEF 60-65%, grade II diastolic dysfunction     - no recent chest pains - no SOB/DOE - compliant with meds. DAPT for recent CVA as opposed to any cardiac indication   2. PAD - hx of right common femoral to popliteal bypass in [place] - followed by vascular     05/2020 stents to bilateral common iliac arteries   3. Hyperlipidemia - 02/2015 TC 148 TG 38 HDL 104 LDL 36   03/2019 TC 155 TG 63 HDL 87 LDL 55 09/2020 TC 154 TG 55 HDL 76 LDL 67 - she is on atorvastatin 80mg  daily.    4. 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 Adrenal insufficiency - followed by endocrine     6. CKD - followed at [facility]    7. Chronic diastolic HF - echo 06/6597 LVEF 60-65%, grade II diastolic dysfunction. Mild to moderate RV dysfunction. PASP 23.  - no recent issues with edema, actually on florinef for adrenal insufficiency history.   - no recent edema.   8. CVA - admit 09/2020 with CVA - was discharged with loop recorder - she is on DAPT per neuro   Past Medical History:  Diagnosis Date   Cancer ([place])    Squamous Cell   Diabetes mellitus without complication ([place])    Hyperlipemia    Hypertension    Hypothyroidism    PAD (peripheral artery disease) ([facility])      Allergies  Allergen Reactions   Bactrim [Sulfamethoxazole-Trimethoprim] Other (See Comments)    Elevates potassium.    Tape Other (See Comments)    not allergic but causes sensitivity.   Fluorescein Rash   Procrit [Epoetin (Alfa)] Rash     Current Outpatient Medications  Medication Sig Dispense Refill   acetaminophen (TYLENOL) 500 MG tablet  Take 500 mg by mouth every 6 (six) hours as needed for mild pain or headache.      Ascorbic Acid (VITAMIN C WITH ROSE HIPS) 500 MG tablet Take 500 mg by mouth at bedtime.     aspirin EC 81 MG tablet Take 81 mg by mouth daily.     atorvastatin (LIPITOR) 80 MG tablet Take 1 tablet (80 mg total) by mouth daily. (Patient taking differently: Take 80 mg by mouth every evening.) 30 tablet 6   Calcium Carbonate (CALCIUM 600 PO) Take 600 mg by mouth in the morning and at bedtime.     Cholecalciferol (VITAMIN D3) 50 MCG (2000 UT) TABS Take 2,000 Units by mouth daily.     clopidogrel (PLAVIX) 75 MG tablet Take 1 tablet (75 mg total) by mouth daily for 19 days. 19 tablet 0   fludrocortisone (FLORINEF) 0.1 MG tablet Take 0.05 mg by mouth daily.     GLUCAGEN HYPOKIT 1 MG SOLR injection Inject into the muscle.     HUMALOG 100 UNIT/ML injection Inject into the skin See admin instructions. VIA INSULIN PUMP     Insulin Human (INSULIN PUMP) SOLN Inject into the skin. HUMALOG 100 UNIT/ML     iron polysaccharides (NIFEREX) 150 MG capsule Take 150 mg by mouth at  bedtime.      levothyroxine (SYNTHROID) 112 MCG tablet Take 112 mcg by mouth daily.     magnesium chloride (SLOW-MAG) 64 MG TBEC SR tablet Take 1-2 tablets by mouth See admin instructions. TAKE 1 TABLET IN THE MORNING & TAKE 2 TABLETS AT NIGHT.     Multiple Vitamin (MULTIVITAMIN WITH MINERALS) TABS tablet Take 1 tablet by mouth daily.     Multiple Vitamins-Minerals (PRESERVISION AREDS 2 PO) Take 1 tablet by mouth daily.      Omega-3 Fatty Acids (FISH OIL) 1200 MG CAPS Take 1,200 mg by mouth daily.     POLY-IRON 150 FORTE 150-25-1 MG-MCG-MG CAPS Take 1 capsule by mouth daily at 6 (six) AM.     predniSONE (DELTASONE) 5 MG tablet Take 5 mg by mouth daily with breakfast.     sodium bicarbonate 650 MG tablet Take 650 mg by mouth in the morning and at bedtime.     No current facility-administered medications for this visit.     Past Surgical History:   Procedure Laterality Date   ABDOMINAL AORTOGRAM W/LOWER EXTREMITY N/A 06/09/2019   Procedure: ABDOMINAL AORTOGRAM W/LOWER EXTREMITY;  Surgeon: Serafina Mitchell, MD;  Location: [facility];  Service: Cardiovascular;  Laterality: N/A;   APPENDECTOMY     CATARACT EXTRACTION Bilateral    CORONARY ARTERY BYPASS GRAFT     LOOP RECORDER INSERTION N/A 10/17/2020   Procedure: LOOP RECORDER INSERTION;  Surgeon: Deboraha Sprang, MD;  Location: [facility];  Service: Cardiovascular;  Laterality: N/A;   PERIPHERAL VASCULAR INTERVENTION Bilateral 06/09/2019   Procedure: PERIPHERAL VASCULAR INTERVENTION;  Surgeon: Serafina Mitchell, MD;  Location: [facility];  Service: Cardiovascular;  Laterality: Bilateral;   TONSILLECTOMY     YAG LASER APPLICATION Right 7/82/9562   Procedure: YAG LASER APPLICATION;  Surgeon: Williams Che, MD;  Location: [facility];  Service: Ophthalmology;  Laterality: Right;     Allergies  Allergen Reactions   Bactrim [Sulfamethoxazole-Trimethoprim] Other (See Comments)    Elevates potassium.    Tape Other (See Comments)    not allergic but causes sensitivity.   Fluorescein Rash   Procrit [Epoetin (Alfa)] Rash      Family History  Problem Relation Age of Onset   Heart disease Mother    Hypertension Mother    Hypertension Father      Social History Ms. Strausser reports that she has never smoked. She has never used smokeless tobacco. Ms. Limbert reports no history of alcohol use.   Review of Systems CONSTITUTIONAL: No weight loss, fever, chills, weakness or fatigue.  HEENT: Eyes: No visual loss, blurred vision, double vision or yellow sclerae.No hearing loss, sneezing, congestion, runny nose or sore throat.  SKIN: No rash or itching.  CARDIOVASCULAR: per hpi RESPIRATORY: No shortness of breath, cough or sputum.  GASTROINTESTINAL: No anorexia, nausea, vomiting or diarrhea. No abdominal pain or blood.  GENITOURINARY: No burning on urination, no  polyuria NEUROLOGICAL: No headache, dizziness, syncope, paralysis, ataxia, numbness or tingling in the extremities. No change in bowel or bladder control.  MUSCULOSKELETAL: No muscle, back pain, joint pain or stiffness.  LYMPHATICS: No enlarged nodes. No history of splenectomy.  PSYCHIATRIC: No history of depression or anxiety.  ENDOCRINOLOGIC: No reports of sweating, cold or heat intolerance. No polyuria or polydipsia.  Marland Kitchen   Physical Examination Today's Vitals   10/27/20 1123  BP: 134/64  Pulse: 80  SpO2: 97%  Weight: 124 lb 9.6 oz (56.5 kg)  Height: 5'  2" (1.575 m)   Body mass index is 22.79 kg/m.  Gen: resting comfortably, no acute distress HEENT: no scleral icterus, pupils equal round and reactive, no palptable cervical adenopathy,  CV: RRR, no mr/g no jvd Resp: Clear to auscultation bilaterally GI: abdomen is soft, non-tender, non-distended, normal bowel sounds, no hepatosplenomegaly MSK: extremities are warm, no edema.  Skin: warm, no rash Neuro:  no focal deficits Psych: appropriate affect   Diagnostic Studies  04/2011 Echo FINDINGS:  LEFT VENTRICLE The left ventricular size is normal. There is normal left ventricular wall  thickness. Left ventricular systolic function is normal. LV ejection fraction  = 55-60%. Left ventricular filling pattern is indeterminate. The left  ventricular wall motion is normal. LV WALL MOTION -  RIGHT VENTRICLE The right ventricle is normal in size and function. The right ventricular  systolic function is normal. The right ventricular wall motion is normal.  LEFT ATRIUM The left atrium is borderline dilated.  RIGHT ATRIUM  Right atrial size is normal. - AORTIC VALVE The aortic valve is normal in structure and function. The aortic valve opens  well. Trace (trivial) aortic regurgitation. - MITRAL VALVE The mitral valve leaflets appear normal. There is no evidence of stenosis,  fluttering, or prolapse. There is mild mitral  regurgitation. - TRICUSPID VALVE Structurally normal tricuspid valve. There is mild tricuspid regurgitation. - PULMONIC VALVE The pulmonic valve is normal in structure and function. Trace pulmonic  valvular regurgitation. - ARTERIES The aortic root is normal size. - VENOUS Pulmonary venous flow pattern is blunted. IVC size was normal. - EFFUSION There is no pericardial effusion. - - MMode/2D Measurements & Calculations IVSd: 0.95 cm LVIDd: 3.9 cm LVPWd: 0.99 cm LVIDs: 2.4 cm LA dim: 3.8 cm Ao root: 2.5 cm EDV(MOD-sp4): 24.0 ml ESV(MOD-sp4): 11.0 ml LVOT diam: 1.5 cm LAV(MOD-sp4): 41.5 ml Doppler Measurements & Calculations MV E max vel: 114.0 cm/sec MV A max vel: 82.4 cm/sec MV E/A: 1.4 Med Peak E' Vel: 7.6 cm/sec Lat Peak E' Vel: 12.2 cm/sec E/Lat E`: 9.4 E/Med E`: 15.0 MV dec time: 0.16 sec SV(LVOT): 45.4 ml Ao mean PG: 7.1 mmHg AVA (VTI): 1.3 cm2 LV V1 VTI: 27.2 cm TR max vel: 230.8 cm/sec TR max PG: 21.3 mmHg RVSP(TR): 26.3 mmHg RAP systole: 5.0 mmHg    11/2014 echo Study Conclusions  - Left ventricle: The cavity size was normal. Systolic function was   normal. The estimated ejection fraction was in the range of 60%   to 65%. Wall motion was normal; there were no regional wall   motion abnormalities. Diastolic dysfunction, grade indeterminate.   Medial annular velocity is slightly low with elevated E/e&' ratio   of 18, indicative of elevated filling pressures. Mild to moderate   concentric left ventricular hypertrophy. - Aortic valve: Moderately calcified annulus. Trileaflet. There was   mild regurgitation. - Mitral valve: Mildly calcified annulus. Mildly thickened leaflets   . There was trivial regurgitation. - Right ventricle: Systolic function was normal. TAPSE: 17.6 mm . - Tricuspid valve: There was mild-moderate regurgitation. - Pulmonary arteries: Systolic pressure was mildly increased. PA   peak pressure: 35 mm Hg (S).     12/2014  Lexiscan MPI No diagnostic ST segment abnormalities. Rare PACs. Very small fixed perfusion defect at the inferior apex most consistent with attenuation artifact rather than scar. No ischemic findings. This is a low risk study. Nuclear stress EF: 70%.   08/2016 echo Study Conclusions   - Left ventricle: The cavity size was normal. Wall  thickness was   normal. Systolic function was normal. The estimated ejection   fraction was in the range of 60% to 65%. Features are consistent   with a pseudonormal left ventricular filling pattern, with   concomitant abnormal relaxation and increased filling pressure   (grade 2 diastolic dysfunction). Doppler parameters are   consistent with high ventricular filling pressure. - Aortic valve: Moderately calcified annulus. Trileaflet. There was   mild regurgitation. - Mitral valve: Calcified annulus. There was mild regurgitation. - Right ventricle: Systolic function was mildly to moderately   reduced.     09/2020 echo IMPRESSIONS     1. Left ventricular ejection fraction, by estimation, is 60 to 65%. The  left ventricle has normal function. The left ventricle has no regional  wall motion abnormalities. Left ventricular diastolic parameters are  consistent with Grade I diastolic  dysfunction (impaired relaxation).   2. Right ventricular systolic function is normal. The right ventricular  size is normal. There is normal pulmonary artery systolic pressure.   3. The mitral valve is normal in structure. Trivial mitral valve  regurgitation. No evidence of mitral stenosis.   4. The aortic valve is calcified. There is moderate calcification of the  aortic valve. There is moderate thickening of the aortic valve. Aortic  valve regurgitation is trivial. Mild to moderate aortic valve  sclerosis/calcification is present, without any   evidence of aortic stenosis.   5. The inferior vena cava is normal in size with greater than 50%  respiratory variability,  suggesting right atrial pressure of 3 mmHg.   Assessment and Plan  1. CAD - denies any recent symptoms - no ACE/ARB due to renal dysfunction -continue current meds. On DAPT for recent CVA, not for any cardiac indication     2. Hyperlipidemia - lipids are at goal, continue atorvastatin.       Arnoldo Lenis, M.D.

## 2020-10-27 NOTE — Patient Instructions (Signed)
Medication Instructions:    Your physician recommends that you continue on your current medications as directed. Please refer to the Current Medication list given to you today.  *If you need a refill on your cardiac medications before your next appointment, please call your pharmacy*   Lab Work: [place]   If you have labs (blood work) drawn today and your tests are completely normal, you will receive your results only by: [place] (if you have MyChart) OR A paper copy in the mail If you have any lab test that is abnormal or we need to change your treatment, we will call you to review the results.   Testing/Procedures: NONE ORDERED  TODAY     Follow-Up: At [facility], you and your health needs are our priority.  As part of our continuing mission to provide you with exceptional heart care, we have created designated Provider Care Teams.  These Care Teams include your primary Cardiologist (physician) and Advanced Practice Providers (APPs -  Physician Assistants and Nurse Practitioners) who all work together to provide you with the care you need, when you need it.  We recommend signing up for the patient portal called "MyChart".  Sign up information is provided on this After Visit Summary.  MyChart is used to connect with patients for Virtual Visits (Telemedicine).  Patients are able to view lab/test results, encounter notes, upcoming appointments, etc.  Non-urgent messages can be sent to your provider as well.   To learn more about what you can do with MyChart, go to [email or web address].    Your next appointment:  .CONTACT [facility] [phone] AS NEEDED FOR  ANY CARDIAC RELATED SYMPTOMS

## 2020-10-27 NOTE — Patient Instructions (Addendum)

## 2020-10-27 NOTE — Progress Notes (Signed)
Cardiology Office Note Date:  10/27/2020  Patient ID:  Kendra Walker, Kendra Walker 19-Jun-1950, MRN [id number] PCP:  Curlene Labrum, MD  Cardiologist:  Dr. Harl Bowie VVS: Dr. Trula Slade Endo: follows at [facility] Electrophysiologist: Dr. Caryl Comes     Chief Complaint: wound check, ILR  History of Present Illness: Kendra Walker is a 70 y.o. female with history of  PVD (right femoral to above-knee popliteal bypass graft with vein in 2008 in [place]), HTN, HLD, IDDM, Addison's disease, Hashimoto's thyroiditis, CAD (CABG 2004), CKD (III), chronic CHF (diastolic), stroke.  She was admitted to [facility] 10/14/20 with RUE weakness found with Small left cortical infarct embolic appearing, and underwent loop implant with Dr. Caryl Comes  TODAY She is with her husband She saw Dr. Harl Bowie this morning already today She has no wound or healing concerns.   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, cryptogenic stroke   Past Medical History:  Diagnosis Date   Cancer ([place])    Squamous Cell   Diabetes mellitus without complication ([place])    Hyperlipemia    Hypertension    Hypothyroidism    PAD (peripheral artery disease) ([place])     Past Surgical History:  Procedure Laterality Date   ABDOMINAL AORTOGRAM W/LOWER EXTREMITY N/A 06/09/2019   Procedure: ABDOMINAL AORTOGRAM W/LOWER EXTREMITY;  Surgeon: Serafina Mitchell, MD;  Location: [place] [facility];  Service: Cardiovascular;  Laterality: N/A;   APPENDECTOMY     CATARACT EXTRACTION Bilateral    CORONARY ARTERY BYPASS GRAFT     LOOP RECORDER INSERTION N/A 10/17/2020   Procedure: LOOP RECORDER INSERTION;  Surgeon: Deboraha Sprang, MD;  Location: [place] [facility];  Service: Cardiovascular;  Laterality: N/A;   PERIPHERAL VASCULAR INTERVENTION Bilateral 06/09/2019   Procedure: PERIPHERAL VASCULAR INTERVENTION;  Surgeon: Serafina Mitchell, MD;  Location: [place] [facility];  Service: Cardiovascular;  Laterality: Bilateral;   TONSILLECTOMY     YAG LASER APPLICATION Right  6/38/7564   Procedure: YAG LASER APPLICATION;  Surgeon: Williams Che, MD;  Location: [facility];  Service: Ophthalmology;  Laterality: Right;    Current Outpatient Medications  Medication Sig Dispense Refill   acetaminophen (TYLENOL) 500 MG tablet Take 500 mg by mouth every 6 (six) hours as needed for mild pain or headache.      Ascorbic Acid (VITAMIN C WITH ROSE HIPS) 500 MG tablet Take 500 mg by mouth at bedtime.     aspirin EC 81 MG tablet Take 81 mg by mouth daily.     atorvastatin (LIPITOR) 80 MG tablet Take 1 tablet (80 mg total) by mouth daily. (Patient taking differently: Take 80 mg by mouth every evening.) 30 tablet 6   Calcium Carbonate (CALCIUM 600 PO) Take 600 mg by mouth in the morning and at bedtime.     Cholecalciferol (VITAMIN D3) 50 MCG (2000 UT) TABS Take 2,000 Units by mouth daily.     clopidogrel (PLAVIX) 75 MG tablet Take 1 tablet (75 mg total) by mouth daily for 19 days. 19 tablet 0   fludrocortisone (FLORINEF) 0.1 MG tablet Take 0.05 mg by mouth daily.     GLUCAGEN HYPOKIT 1 MG SOLR injection Inject into the muscle.     HUMALOG 100 UNIT/ML injection Inject into the skin See admin instructions. VIA INSULIN PUMP     Insulin Human (INSULIN PUMP) SOLN Inject into the skin. HUMALOG 100 UNIT/ML     iron polysaccharides (NIFEREX) 150 MG capsule Take 150 mg by mouth at bedtime.  levothyroxine (SYNTHROID) 112 MCG tablet Take 112 mcg by mouth daily.     magnesium chloride (SLOW-MAG) 64 MG TBEC SR tablet Take 1-2 tablets by mouth See admin instructions. TAKE 1 TABLET IN THE MORNING & TAKE 2 TABLETS AT NIGHT.     Multiple Vitamin (MULTIVITAMIN WITH MINERALS) TABS tablet Take 1 tablet by mouth daily.     Multiple Vitamins-Minerals (PRESERVISION AREDS 2 PO) Take 1 tablet by mouth daily.      Omega-3 Fatty Acids (FISH OIL) 1200 MG CAPS Take 1,200 mg by mouth daily.     POLY-IRON 150 FORTE 150-25-1 MG-MCG-MG CAPS Take 1 capsule by mouth daily at 6 (six) AM.     predniSONE  (DELTASONE) 5 MG tablet Take 5 mg by mouth daily with breakfast.     sodium bicarbonate 650 MG tablet Take 650 mg by mouth in the morning and at bedtime.     No current facility-administered medications for this visit.    Allergies:   Bactrim [sulfamethoxazole-trimethoprim], Tape, Fluorescein, and Procrit [epoetin (alfa)]   Social History:  The patient  reports that she has never smoked. She has never used smokeless tobacco. She reports that she does not drink alcohol and does not use drugs.   Family History:  The patient's family history includes Heart disease in her mother; Hypertension in her father and mother.  ROS:  Please see the history of present illness.    All other systems are reviewed and otherwise negative.   PHYSICAL EXAM:  VS:  There were no vitals taken for this visit. BMI: There is no height or weight on file to calculate BMI. Well nourished, well developed, in no acute distress HEENT: normocephalic, atraumatic Neck: no JVD, carotid bruits or masses Cardiac:  RRR; no significant murmurs, no rubs, or gallops Lungs:  CTA b/l, no wheezing, rhonchi or rales Abd: soft, nontender MS: no deformity, she very petite, perhaps advanced atrophy Ext: no edema Skin: warm and dry, no rash Neuro:  No gross deficits appreciated Psych: euthymic mood, full affect   ILR site: steri strips are removed without difficulty.  Wound edges are well approximated, no erythema, edema or fluctuation, no drainage, bleeding.  No heat, no signs of infection, is well healed, no discomfort   EKG:  not done today  Device interrogation transmission from today is reviewed by myself:  No device observations SR, PAC   10/16/20: TTE   IMPRESSIONS   1. Left ventricular ejection fraction, by estimation, is 60 to 65%. The  left ventricle has normal function. The left ventricle has no regional  wall motion abnormalities. Left ventricular diastolic parameters are  consistent with Grade I diastolic   dysfunction (impaired relaxation).   2. Right ventricular systolic function is normal. The right ventricular  size is normal. There is normal pulmonary artery systolic pressure.   3. The mitral valve is normal in structure. Trivial mitral valve  regurgitation. No evidence of mitral stenosis.   4. The aortic valve is calcified. There is moderate calcification of the  aortic valve. There is moderate thickening of the aortic valve. Aortic  valve regurgitation is trivial. Mild to moderate aortic valve  sclerosis/calcification is present, without any   evidence of aortic stenosis.   5. The inferior vena cava is normal in size with greater than 50%  respiratory variability, suggesting right atrial pressure of 3 mmHg.   Conclusion(s)/Recommendation(s): No intracardiac source of embolism  detected on this transthoracic study. A transesophageal echocardiogram is  recommended to exclude cardiac  source of embolism if clinically indicated.   Recent Labs: 10/14/2020: ALT 23; Hemoglobin 15.3; Platelets 278 10/17/2020: BUN 39; Creatinine, Ser 1.64; Potassium 4.2; Sodium 136  10/15/2020: Cholesterol 154; HDL 76; LDL Cholesterol 67; Total CHOL/HDL Ratio 2.0; Triglycerides 55; VLDL 11   Estimated Creatinine Clearance: 25.6 mL/min (A) (by C-G formula based on SCr of 1.64 mg/dL (H)).   Wt Readings from Last 3 Encounters:  10/14/20 124 lb 5.4 oz (56.4 kg)  06/15/20 130 lb 12.8 oz (59.3 kg)  12/14/19 127 lb (57.6 kg)     Other studies reviewed: Additional studies/records reviewed today include: summarized above  ASSESSMENT AND PLAN:  ILR Well healed Device and monitoring system questions answered  C/w Dr. Harl Bowie CAD Chronic CHF (diastolic) HTN HLD PVD  Disposition: F/u with remotes monthly/as needed, in clinic with EP, PRN  Current medicines are reviewed at length with the patient today.  The patient did not have any concerns regarding medicines.  Venetia Night, PA-C 10/27/2020 5:29  AM     [facility] [address] [place] [address] [phone] (office)  [phone] (fax)

## 2020-10-31 DIAGNOSIS — E10649 Type 1 diabetes mellitus with hypoglycemia without coma: Secondary | ICD-10-CM | POA: Diagnosis not present

## 2020-11-11 DIAGNOSIS — Z7982 Long term (current) use of aspirin: Secondary | ICD-10-CM | POA: Diagnosis not present

## 2020-11-11 DIAGNOSIS — E039 Hypothyroidism, unspecified: Secondary | ICD-10-CM | POA: Diagnosis not present

## 2020-11-11 DIAGNOSIS — Z79899 Other long term (current) drug therapy: Secondary | ICD-10-CM | POA: Diagnosis not present

## 2020-11-11 DIAGNOSIS — E10649 Type 1 diabetes mellitus with hypoglycemia without coma: Secondary | ICD-10-CM | POA: Diagnosis not present

## 2020-11-11 DIAGNOSIS — E038 Other specified hypothyroidism: Secondary | ICD-10-CM | POA: Diagnosis not present

## 2020-11-11 DIAGNOSIS — E063 Autoimmune thyroiditis: Secondary | ICD-10-CM | POA: Diagnosis not present

## 2020-11-11 DIAGNOSIS — Z4681 Encounter for fitting and adjustment of insulin pump: Secondary | ICD-10-CM | POA: Diagnosis not present

## 2020-11-11 DIAGNOSIS — I10 Essential (primary) hypertension: Secondary | ICD-10-CM | POA: Diagnosis not present

## 2020-11-11 DIAGNOSIS — Z794 Long term (current) use of insulin: Secondary | ICD-10-CM | POA: Diagnosis not present

## 2020-11-11 DIAGNOSIS — E271 Primary adrenocortical insufficiency: Secondary | ICD-10-CM | POA: Diagnosis not present

## 2020-11-11 DIAGNOSIS — Z7952 Long term (current) use of systemic steroids: Secondary | ICD-10-CM | POA: Diagnosis not present

## 2020-11-11 DIAGNOSIS — Z9641 Presence of insulin pump (external) (internal): Secondary | ICD-10-CM | POA: Diagnosis not present

## 2020-11-16 DIAGNOSIS — I10 Essential (primary) hypertension: Secondary | ICD-10-CM | POA: Diagnosis not present

## 2020-11-16 DIAGNOSIS — J8410 Pulmonary fibrosis, unspecified: Secondary | ICD-10-CM | POA: Diagnosis not present

## 2020-11-16 DIAGNOSIS — I639 Cerebral infarction, unspecified: Secondary | ICD-10-CM | POA: Diagnosis not present

## 2020-11-16 DIAGNOSIS — E7849 Other hyperlipidemia: Secondary | ICD-10-CM | POA: Diagnosis not present

## 2020-11-16 DIAGNOSIS — I6930 Unspecified sequelae of cerebral infarction: Secondary | ICD-10-CM | POA: Diagnosis not present

## 2020-11-16 DIAGNOSIS — E039 Hypothyroidism, unspecified: Secondary | ICD-10-CM | POA: Diagnosis not present

## 2020-11-16 DIAGNOSIS — N184 Chronic kidney disease, stage 4 (severe): Secondary | ICD-10-CM | POA: Diagnosis not present

## 2020-11-16 DIAGNOSIS — E1021 Type 1 diabetes mellitus with diabetic nephropathy: Secondary | ICD-10-CM | POA: Diagnosis not present

## 2020-11-21 ENCOUNTER — Ambulatory Visit (INDEPENDENT_AMBULATORY_CARE_PROVIDER_SITE_OTHER): Payer: Medicare Other

## 2020-11-21 DIAGNOSIS — I639 Cerebral infarction, unspecified: Secondary | ICD-10-CM | POA: Diagnosis not present

## 2020-11-22 LAB — CUP PACEART REMOTE DEVICE CHECK
Date Time Interrogation Session: 20221031144149
Implantable Pulse Generator Implant Date: 20220926

## 2020-11-28 DIAGNOSIS — Z20822 Contact with and (suspected) exposure to covid-19: Secondary | ICD-10-CM | POA: Diagnosis not present

## 2020-11-28 NOTE — Progress Notes (Signed)
Carelink Summary Report / Loop Recorder 

## 2020-12-12 ENCOUNTER — Encounter: Payer: Self-pay | Admitting: Adult Health

## 2020-12-12 ENCOUNTER — Ambulatory Visit (INDEPENDENT_AMBULATORY_CARE_PROVIDER_SITE_OTHER): Payer: Medicare Other | Admitting: Adult Health

## 2020-12-12 ENCOUNTER — Other Ambulatory Visit: Payer: Self-pay

## 2020-12-12 VITALS — BP 160/70 | HR 74 | Ht 62.0 in | Wt 126.0 lb

## 2020-12-12 DIAGNOSIS — E785 Hyperlipidemia, unspecified: Secondary | ICD-10-CM | POA: Diagnosis not present

## 2020-12-12 DIAGNOSIS — I10 Essential (primary) hypertension: Secondary | ICD-10-CM

## 2020-12-12 DIAGNOSIS — I639 Cerebral infarction, unspecified: Secondary | ICD-10-CM | POA: Diagnosis not present

## 2020-12-12 DIAGNOSIS — E103553 Type 1 diabetes mellitus with stable proliferative diabetic retinopathy, bilateral: Secondary | ICD-10-CM

## 2020-12-12 DIAGNOSIS — Z20822 Contact with and (suspected) exposure to covid-19: Secondary | ICD-10-CM | POA: Diagnosis not present

## 2020-12-12 NOTE — Patient Instructions (Addendum)
Continue aspirin 81 mg daily  and atorvastatin 80 mg daily for secondary stroke prevention  Loop recorder will continue to be monitored by cardiology for possible atrial fibrillation  Continue to follow up with PCP, endocrinology and cardiology regarding cholesterol, blood pressure and diabetes management  Maintain strict control of hypertension with blood pressure goal below 130/90, diabetes with hemoglobin A1c goal below 7.0% and cholesterol with LDL cholesterol (bad cholesterol) goal below 70 mg/dL.      Followup in the future with me in 6 months or call earlier if needed      Thank you for coming to see Kendra Walker at [facility]. I hope we have been able to provide you high quality care today.  You may receive a patient satisfaction survey over the next few weeks. We would appreciate your feedback and comments so that we may continue to improve ourselves and the health of our patients.     Stroke Prevention Some medical conditions and lifestyle choices can lead to a higher risk for a stroke. You can help to prevent a stroke by eating healthy foods and exercising. It also helps to not smoke and to manage any health problems you may have. How can this condition affect me? A stroke is an emergency. It should be treated right away. A stroke can lead to brain damage or threaten your life. There is a better chance of surviving and getting better after a stroke if you get medical help right away. What can increase my risk? The following medical conditions may increase your risk of a stroke: Diseases of the heart and blood vessels (cardiovascular disease). High blood pressure (hypertension). Diabetes. High cholesterol. Sickle cell disease. Problems with blood clotting. Being very overweight. Sleeping problems (obstructivesleep apnea). Other risk factors include: Being older than age 53. A history of blood clots, stroke, or mini-stroke (TIA). Race, ethnic background, or  a family history of stroke. Smoking or using tobacco products. Taking birth control pills, especially if you smoke. Heavy alcohol and drug use. Not being active. What actions can I take to prevent this? Manage your health conditions High cholesterol. Eat a healthy diet. If this is not enough to manage your cholesterol, you may need to take medicines. Take medicines as told by your doctor. High blood pressure. Try to keep your blood pressure below 130/80. If your blood pressure cannot be managed through a healthy diet and regular exercise, you may need to take medicines. Take medicines as told by your doctor. Ask your doctor if you should check your blood pressure at home. Have your blood pressure checked every year. Diabetes. Eat a healthy diet and get regular exercise. If your blood sugar (glucose) cannot be managed through diet and exercise, you may need to take medicines. Take medicines as told by your doctor. Talk to your doctor about getting checked for sleeping problems. Signs of a problem can include: Snoring a lot. Feeling very tired. Make sure that you manage any other conditions you have. Nutrition  Follow instructions from your doctor about what to eat or drink. You may be told to: Eat and drink fewer calories each day. Limit how much salt (sodium) you use to 1,500 milligrams (mg) each day. Use only healthy fats for cooking, such as olive oil, canola oil, and sunflower oil. Eat healthy foods. To do this: Choose foods that are high in fiber. These include whole grains, and fresh fruits and vegetables. Eat at least 5 servings of fruits and vegetables a day. Try  to fill one-half of your plate with fruits and vegetables at each meal. Choose low-fat (lean) proteins. These include low-fat cuts of meat, chicken without skin, fish, tofu, beans, and nuts. Eat low-fat dairy products. Avoid foods that: Are high in salt. Have saturated fat. Have trans fat. Have cholesterol. Are  processed or pre-made. Count how many carbohydrates you eat and drink each day. Lifestyle If you drink alcohol: Limit how much you have to: 0-1 drink a day for women who are not pregnant. 0-2 drinks a day for men. Know how much alcohol is in your drink. In the [place] one drink equals one 12 oz bottle of beer (38mL), one 5 oz glass of wine (110mL), or one 1 oz glass of hard liquor (9mL). Do not smoke or use any products that have nicotine or tobacco. If you need help quitting, ask your doctor. Avoid secondhand smoke. Do not use drugs. Activity  Try to stay at a healthy weight. Get at least 30 minutes of exercise on most days, such as: Fast walking. Biking. Swimming. Medicines Take over-the-counter and prescription medicines only as told by your doctor. Avoid taking birth control pills. Talk to your doctor about the risks of taking birth control pills if: You are over 38 years old. You smoke. You get very bad headaches. You have had a blood clot. Where to find more information American Stroke Association: www.strokeassociation.org Get help right away if: You or a loved one has any signs of a stroke. "BE FAST" is an easy way to remember the warning signs: B - Balance. Dizziness, sudden trouble walking, or loss of balance. E - Eyes. Trouble seeing or a change in how you see. F - Face. Sudden weakness or loss of feeling of the face. The face or eyelid may droop on one side. A - Arms. Weakness or loss of feeling in an arm. This happens all of a sudden and most often on one side of the body. S - Speech. Sudden trouble speaking, slurred speech, or trouble understanding what people say. T - Time. Time to call emergency services. Write down what time symptoms started. You or a loved one has other signs of a stroke, such as: A sudden, very bad headache with no known cause. Feeling like you may vomit (nausea). Vomiting. A seizure. These symptoms may be an emergency. Get help right away.  Call your local emergency services (911 in the [place] Do not wait to see if the symptoms will go away. Do not drive yourself to the hospital. Summary You can help to prevent a stroke by eating healthy, exercising, and not smoking. It also helps to manage any health problems you have. Do not smoke or use any products that contain nicotine or tobacco. Get help right away if you or a loved one has any signs of a stroke. This information is not intended to replace advice given to you by your health care provider. Make sure you discuss any questions you have with your health care provider. Document Revised: 08/10/2019 Document Reviewed: 08/10/2019 Elsevier Patient Education  [place].

## 2020-12-12 NOTE — Progress Notes (Signed)
[facility] [address] [place]. [place] [address] [phone]       HOSPITAL FOLLOW UP NOTE  [name] Date of Birth:  12/24/1950 Medical Record Number:  [id number]   Reason for Referral:  hospital stroke follow up    SUBJECTIVE:   CHIEF COMPLAINT:  Chief Complaint  Patient presents with   Follow-up    Rm 2 with spouse Herbie [place]  Pt is well, has occasional numbness in R hand but overall stable, not other concerns     HPI:   [name] is a 70 y.o. female with history of HLD, HTN, DM2, and PAD who presented on 10/14/2020 with Right UE weakness.  Personally reviewed hospitalization pertinent progress notes, lab work and pertinent imaging.  Evaluated by Dr. Leonie Man for small left cortical infarct, embolic secondary to unclear source. MRI also showed prior stroke on imaging. Loop recorder placed 9/26.  MRA head/neck bilateral ICA siphon atherosclerosis and moderate left ICA supraclinoid stenosis.  Carotid Doppler b/l 1 to 39% ICA stenosis.  EF 60 to 65% without cardiac source of embolus identified.  LDL 67.  A1c 8.0.  Recommended DAPT for 3 weeks and aspirin alone as well as resumed atorvastatin 80 mg daily.  PT/OT eval -no therapy needs.  Today, 12/12/2020, Kendra Walker is being seen for initial hospital follow-up accompanied by her husband, Herbie [place].  Doing well since discharge without new stroke/TIA symptoms.  Reports occasional right hand tingling present since her stroke but does not interfere with any activity or functioning.  Denies any weakness.    Completed 3 weeks DAPT -remains on aspirin alone and atorvastatin -denies side effects Blood pressure today 160/70 - occasionally monitors at home and normally SBP<130s.  Glucose levels have been good per pt - follows with endocrinology  Loop recorder has not shown atrial fibrillation thus far  No further concerns at this time     PERTINENT IMAGING  Per recent hospitalization 10/14/2020 [facility]  no acute normality MRI brain Punctate acute cortical infarct within the posterior left frontal lobe (motor strip).  Small chronic cortical infarcts within the left parietal occipital lobes and mild generalized cerebral atrophy MRA bilateral ICA siphon atherosclerosis and moderate left ICA supraclinoid stenosis. Carotid Doppler no significant bilateral extracranial carotid stenosis.   2D Echo - EF 60 - 65%. No cardiac source of emboli identified.  S/p loop recorder 9/26 LDL 67 HgbA1c 8.0    ROS:   14 system review of systems performed and negative with exception of those listed in HPI  PMH:  Past Medical History:  Diagnosis Date   Cancer ([place])    Squamous Cell   Diabetes mellitus without complication ([place])    Hyperlipemia    Hypertension    Hypothyroidism    PAD (peripheral artery disease) ([place])     PSH:  Past Surgical History:  Procedure Laterality Date   ABDOMINAL AORTOGRAM W/LOWER EXTREMITY N/A 06/09/2019   Procedure: ABDOMINAL AORTOGRAM W/LOWER EXTREMITY;  Surgeon: Serafina Mitchell, MD;  Location: [facility];  Service: Cardiovascular;  Laterality: N/A;   APPENDECTOMY     CATARACT EXTRACTION Bilateral    CORONARY ARTERY BYPASS GRAFT     LOOP RECORDER INSERTION N/A 10/17/2020   Procedure: LOOP RECORDER INSERTION;  Surgeon: Deboraha Sprang, MD;  Location: [facility];  Service: Cardiovascular;  Laterality: N/A;   PERIPHERAL VASCULAR INTERVENTION Bilateral 06/09/2019   Procedure: PERIPHERAL VASCULAR INTERVENTION;  Surgeon: Serafina Mitchell, MD;  Location: [facility];  Service: Cardiovascular;  Laterality: Bilateral;   TONSILLECTOMY     YAG LASER APPLICATION Right 1/61/0960   Procedure: YAG LASER APPLICATION;  Surgeon: Williams Che, MD;  Location: [facility];  Service: Ophthalmology;  Laterality: Right;    Social History:  Social History   Socioeconomic History   Marital status: Married    Spouse name: Not on file   Number of children: Not on file    Years of education: Not on file   Highest education level: Not on file  Occupational History   Not on file  Tobacco Use   Smoking status: Never   Smokeless tobacco: Never  Vaping Use   Vaping Use: Never used  Substance and Sexual Activity   Alcohol use: No    Alcohol/week: 0.0 standard drinks   Drug use: No   Sexual activity: Not on file  Other Topics Concern   Not on file  Social History Narrative   Not on file   Social Determinants of Health   Financial Resource Strain: Not on file  Food Insecurity: Not on file  Transportation Needs: Not on file  Physical Activity: Not on file  Stress: Not on file  Social Connections: Not on file  Intimate Partner Violence: Not on file    Family History:  Family History  Problem Relation Age of Onset   Heart disease Mother    Hypertension Mother    Hypertension Father     Medications:   Current Outpatient Medications on File Prior to Visit  Medication Sig Dispense Refill   acetaminophen (TYLENOL) 500 MG tablet Take 500 mg by mouth every 6 (six) hours as needed for mild pain or headache.      Ascorbic Acid (VITAMIN C WITH ROSE HIPS) 500 MG tablet Take 500 mg by mouth at bedtime.     aspirin EC 81 MG tablet Take 81 mg by mouth daily.     atorvastatin (LIPITOR) 80 MG tablet Take 1 tablet (80 mg total) by mouth daily. (Patient taking differently: Take 80 mg by mouth every evening.) 30 tablet 6   Calcium Carbonate (CALCIUM 600 PO) Take 600 mg by mouth in the morning and at bedtime.     Cholecalciferol (VITAMIN D3) 50 MCG (2000 UT) TABS Take 2,000 Units by mouth daily.     fludrocortisone (FLORINEF) 0.1 MG tablet Take 0.05 mg by mouth daily.     GLUCAGEN HYPOKIT 1 MG SOLR injection Inject into the muscle.     HUMALOG 100 UNIT/ML injection Inject into the skin See admin instructions. VIA INSULIN PUMP     Insulin Human (INSULIN PUMP) SOLN Inject into the skin. HUMALOG 100 UNIT/ML     iron polysaccharides (NIFEREX) 150 MG capsule Take 150  mg by mouth at bedtime.      levothyroxine (SYNTHROID) 112 MCG tablet Take 112 mcg by mouth daily.     magnesium chloride (SLOW-MAG) 64 MG TBEC SR tablet Take 1-2 tablets by mouth See admin instructions. TAKE 1 TABLET IN THE MORNING & TAKE 2 TABLETS AT NIGHT.     Multiple Vitamin (MULTIVITAMIN WITH MINERALS) TABS tablet Take 1 tablet by mouth daily.     Multiple Vitamins-Minerals (PRESERVISION AREDS 2 PO) Take 1 tablet by mouth daily.      Omega-3 Fatty Acids (FISH OIL) 1200 MG CAPS Take 1,200 mg by mouth daily.     POLY-IRON 150 FORTE 150-25-1 MG-MCG-MG CAPS Take 1 capsule by mouth daily at 6 (six) AM.  predniSONE (DELTASONE) 5 MG tablet Take 5 mg by mouth daily with breakfast.     sodium bicarbonate 650 MG tablet Take 650 mg by mouth in the morning and at bedtime.     No current facility-administered medications on file prior to visit.    Allergies:   Allergies  Allergen Reactions   Bactrim [Sulfamethoxazole-Trimethoprim] Other (See Comments)    Elevates potassium.    Tape Other (See Comments)    not allergic but causes sensitivity.   Fluorescein Rash   Procrit [Epoetin (Alfa)] Rash      OBJECTIVE:  Physical Exam  Vitals:   12/12/20 1403  BP: (!) 160/70  Pulse: 74  Weight: 126 lb (57.2 kg)  Height: 5\' 2"  (1.575 m)   Body mass index is 23.05 kg/m. No results found.  Post stroke PHQ 2/9 Depression screen PHQ 2/9 12/12/2020  Decreased Interest 0  Down, Depressed, Hopeless 0  PHQ - 2 Score 0     General: well developed, well nourished, very pleasant elderly Caucasian female, seated, in no evident distress Head: head normocephalic and atraumatic.   Neck: supple with no carotid or supraclavicular bruits Cardiovascular: regular rate and rhythm, no murmurs Musculoskeletal: no deformity Skin:  no rash/petichiae Vascular:  Normal pulses all extremities   Neurologic Exam Mental Status: Awake and fully alert.  Fluent speech and language.  Oriented to place and time.  Recent and remote memory intact. Attention span, concentration and fund of knowledge appropriate. Mood and affect appropriate.  Cranial Nerves: Fundoscopic exam reveals sharp disc margins. Pupils equal, briskly reactive to light. Extraocular movements full without nystagmus. Visual fields full to confrontation. Hearing intact. Facial sensation intact. Face, tongue, palate moves normally and symmetrically.  Motor: Normal bulk and tone. Normal strength in all tested extremity muscles Sensory.: intact to touch , pinprick , position and vibratory sensation.  Coordination: Rapid alternating movements normal in all extremities. Finger-to-nose and heel-to-shin performed accurately bilaterally. Gait and Station: Arises from chair without difficulty. Stance is normal. Gait demonstrates normal stride length and mild imbalance (chronic) with use of cane. Tandem walk and heel toe not attempted Reflexes: 1+ and symmetric. Toes downgoing.     NIHSS  0 Modified Rankin  0      ASSESSMENT: Kendra Walker is a 70 y.o. year old female small left cortical infarct on 2/70/3500 likely embolic secondary to unclear source s/p ILR. Vascular risk factors include prior stroke on imaging, HTN, HLD, DM type 1 and PAD.      PLAN:  Cryptogenic stroke:  Residual deficit: Mild occasional right hand tingling.  Discussed typical recovery time. Continue aspirin 81 mg daily  and atorvastatin 80 mg daily for secondary stroke prevention.   Loop recorder has not shown atrial fibrillation thus far Discussed secondary stroke prevention measures and importance of close PCP follow up for aggressive stroke risk factor management. I have gone over the pathophysiology of stroke, warning signs and symptoms, risk factors and their management in some detail with instructions to go to the closest emergency room for symptoms of concern. HTN: BP goal <130/90.  Elevated at today's visit - stable at home per report monitored by  PCP/cardiology HLD: LDL goal <70. Recent LDL 67 on atorvastatin 80 mg daily per PCP/cardiology.  DMI: A1c goal<7.0. Recent A1c 8.0 routinely followed by endocrinology.     Follow up in 6 months or call earlier if needed   CC:  [facility] provider: Dr. Leonie Man PCP: Curlene Labrum, MD    I spent  54 minutes of face-to-face and non-face-to-face time with patient and husband.  This included previsit chart review including review of recent hospitalization, lab review, study review, electronic health record documentation, patient education regarding recent stroke including potential etiology, secondary stroke prevention measures and importance of managing stroke risk factors, residual deficits and typical recovery time and answered all other questions to patient satisfaction   Frann Rider, AGNP-BC  [facility] [address] [place], [place] [address]  Phone [phone] Fax [phone] Note: This document was prepared with digital dictation and possible smart phrase technology. Any transcriptional errors that result from this process are unintentional.

## 2020-12-16 NOTE — Progress Notes (Signed)
I agree with the above plan 

## 2020-12-20 DIAGNOSIS — L84 Corns and callosities: Secondary | ICD-10-CM | POA: Diagnosis not present

## 2020-12-20 DIAGNOSIS — E1342 Other specified diabetes mellitus with diabetic polyneuropathy: Secondary | ICD-10-CM | POA: Diagnosis not present

## 2020-12-26 ENCOUNTER — Ambulatory Visit (INDEPENDENT_AMBULATORY_CARE_PROVIDER_SITE_OTHER): Payer: Medicare Other

## 2020-12-26 DIAGNOSIS — I639 Cerebral infarction, unspecified: Secondary | ICD-10-CM

## 2020-12-26 LAB — CUP PACEART REMOTE DEVICE CHECK
Date Time Interrogation Session: 20221203144421
Implantable Pulse Generator Implant Date: 20220926

## 2020-12-27 DIAGNOSIS — Z20822 Contact with and (suspected) exposure to covid-19: Secondary | ICD-10-CM | POA: Diagnosis not present

## 2020-12-29 DIAGNOSIS — Z79890 Hormone replacement therapy (postmenopausal): Secondary | ICD-10-CM | POA: Diagnosis not present

## 2020-12-29 DIAGNOSIS — Z9641 Presence of insulin pump (external) (internal): Secondary | ICD-10-CM | POA: Diagnosis not present

## 2020-12-29 DIAGNOSIS — E109 Type 1 diabetes mellitus without complications: Secondary | ICD-10-CM | POA: Diagnosis not present

## 2020-12-29 DIAGNOSIS — I10 Essential (primary) hypertension: Secondary | ICD-10-CM | POA: Diagnosis not present

## 2020-12-29 DIAGNOSIS — Z79899 Other long term (current) drug therapy: Secondary | ICD-10-CM | POA: Diagnosis not present

## 2020-12-29 DIAGNOSIS — Z794 Long term (current) use of insulin: Secondary | ICD-10-CM | POA: Diagnosis not present

## 2020-12-29 DIAGNOSIS — E039 Hypothyroidism, unspecified: Secondary | ICD-10-CM | POA: Diagnosis not present

## 2020-12-29 DIAGNOSIS — E10649 Type 1 diabetes mellitus with hypoglycemia without coma: Secondary | ICD-10-CM | POA: Diagnosis not present

## 2020-12-29 DIAGNOSIS — E271 Primary adrenocortical insufficiency: Secondary | ICD-10-CM | POA: Diagnosis not present

## 2020-12-29 DIAGNOSIS — Z8673 Personal history of transient ischemic attack (TIA), and cerebral infarction without residual deficits: Secondary | ICD-10-CM | POA: Diagnosis not present

## 2021-01-03 DIAGNOSIS — Z955 Presence of coronary angioplasty implant and graft: Secondary | ICD-10-CM | POA: Diagnosis not present

## 2021-01-03 DIAGNOSIS — E2740 Unspecified adrenocortical insufficiency: Secondary | ICD-10-CM | POA: Diagnosis not present

## 2021-01-03 DIAGNOSIS — Z7982 Long term (current) use of aspirin: Secondary | ICD-10-CM | POA: Diagnosis not present

## 2021-01-03 DIAGNOSIS — Z79899 Other long term (current) drug therapy: Secondary | ICD-10-CM | POA: Diagnosis not present

## 2021-01-03 DIAGNOSIS — N1832 Chronic kidney disease, stage 3b: Secondary | ICD-10-CM | POA: Diagnosis not present

## 2021-01-03 DIAGNOSIS — Z20828 Contact with and (suspected) exposure to other viral communicable diseases: Secondary | ICD-10-CM | POA: Diagnosis not present

## 2021-01-03 DIAGNOSIS — D631 Anemia in chronic kidney disease: Secondary | ICD-10-CM | POA: Diagnosis not present

## 2021-01-03 DIAGNOSIS — E1042 Type 1 diabetes mellitus with diabetic polyneuropathy: Secondary | ICD-10-CM | POA: Diagnosis not present

## 2021-01-03 DIAGNOSIS — E1022 Type 1 diabetes mellitus with diabetic chronic kidney disease: Secondary | ICD-10-CM | POA: Diagnosis not present

## 2021-01-03 DIAGNOSIS — E875 Hyperkalemia: Secondary | ICD-10-CM | POA: Diagnosis not present

## 2021-01-03 DIAGNOSIS — Z9641 Presence of insulin pump (external) (internal): Secondary | ICD-10-CM | POA: Diagnosis not present

## 2021-01-03 DIAGNOSIS — Z951 Presence of aortocoronary bypass graft: Secondary | ICD-10-CM | POA: Diagnosis not present

## 2021-01-03 DIAGNOSIS — I129 Hypertensive chronic kidney disease with stage 1 through stage 4 chronic kidney disease, or unspecified chronic kidney disease: Secondary | ICD-10-CM | POA: Diagnosis not present

## 2021-01-03 DIAGNOSIS — I2510 Atherosclerotic heart disease of native coronary artery without angina pectoris: Secondary | ICD-10-CM | POA: Diagnosis not present

## 2021-01-03 NOTE — Progress Notes (Signed)
Carelink Summary Report / Loop Recorder 

## 2021-01-23 DIAGNOSIS — D649 Anemia, unspecified: Secondary | ICD-10-CM | POA: Diagnosis not present

## 2021-01-23 DIAGNOSIS — E039 Hypothyroidism, unspecified: Secondary | ICD-10-CM | POA: Diagnosis not present

## 2021-01-23 DIAGNOSIS — E782 Mixed hyperlipidemia: Secondary | ICD-10-CM | POA: Diagnosis not present

## 2021-01-23 DIAGNOSIS — E875 Hyperkalemia: Secondary | ICD-10-CM | POA: Diagnosis not present

## 2021-01-23 DIAGNOSIS — E7849 Other hyperlipidemia: Secondary | ICD-10-CM | POA: Diagnosis not present

## 2021-01-23 DIAGNOSIS — I10 Essential (primary) hypertension: Secondary | ICD-10-CM | POA: Diagnosis not present

## 2021-01-23 DIAGNOSIS — N189 Chronic kidney disease, unspecified: Secondary | ICD-10-CM | POA: Diagnosis not present

## 2021-01-23 DIAGNOSIS — E1169 Type 2 diabetes mellitus with other specified complication: Secondary | ICD-10-CM | POA: Diagnosis not present

## 2021-01-23 DIAGNOSIS — R5383 Other fatigue: Secondary | ICD-10-CM | POA: Diagnosis not present

## 2021-01-26 DIAGNOSIS — N184 Chronic kidney disease, stage 4 (severe): Secondary | ICD-10-CM | POA: Diagnosis not present

## 2021-01-26 DIAGNOSIS — E039 Hypothyroidism, unspecified: Secondary | ICD-10-CM | POA: Diagnosis not present

## 2021-01-26 DIAGNOSIS — E1021 Type 1 diabetes mellitus with diabetic nephropathy: Secondary | ICD-10-CM | POA: Diagnosis not present

## 2021-01-26 DIAGNOSIS — I10 Essential (primary) hypertension: Secondary | ICD-10-CM | POA: Diagnosis not present

## 2021-01-26 DIAGNOSIS — J8410 Pulmonary fibrosis, unspecified: Secondary | ICD-10-CM | POA: Diagnosis not present

## 2021-01-26 DIAGNOSIS — I6930 Unspecified sequelae of cerebral infarction: Secondary | ICD-10-CM | POA: Diagnosis not present

## 2021-01-26 DIAGNOSIS — Z0001 Encounter for general adult medical examination with abnormal findings: Secondary | ICD-10-CM | POA: Diagnosis not present

## 2021-01-26 DIAGNOSIS — E7849 Other hyperlipidemia: Secondary | ICD-10-CM | POA: Diagnosis not present

## 2021-01-30 ENCOUNTER — Ambulatory Visit (INDEPENDENT_AMBULATORY_CARE_PROVIDER_SITE_OTHER): Payer: Medicare Other

## 2021-01-30 DIAGNOSIS — I639 Cerebral infarction, unspecified: Secondary | ICD-10-CM | POA: Diagnosis not present

## 2021-01-30 LAB — CUP PACEART REMOTE DEVICE CHECK
Date Time Interrogation Session: 20230108230812
Implantable Pulse Generator Implant Date: 20220926

## 2021-01-31 DIAGNOSIS — L97522 Non-pressure chronic ulcer of other part of left foot with fat layer exposed: Secondary | ICD-10-CM | POA: Diagnosis not present

## 2021-01-31 DIAGNOSIS — E1142 Type 2 diabetes mellitus with diabetic polyneuropathy: Secondary | ICD-10-CM | POA: Diagnosis not present

## 2021-02-06 ENCOUNTER — Telehealth: Payer: Self-pay | Admitting: Adult Health

## 2021-02-06 NOTE — Telephone Encounter (Signed)
Pt request to be seen due to worsening symptoms:Pt stated she wants to rule out the possibility of a stroke as to why she wanted the appointment  This is the reason as to why she has been scheduled, pt declined needing to go to the ED just yet.  This is FYI for [name], NP and POD 3

## 2021-02-06 NOTE — Telephone Encounter (Signed)
Please advise patient that if she feels as she is having a stroke, it is very important to call 911 for emergent evaluation for quick treatment options if a new stroke is found. If she continues to refuse, I will see her Thursday but we cannot do anything emergently - she will likely be waiting a few weeks prior to completing any imaging if this is indicated hence the reason she needs to be evaluated in ED. Thank you

## 2021-02-06 NOTE — Telephone Encounter (Signed)
Called patient and read her NP's message. She stated she has a dex com and every time she goes to hospital or has tests she has to remove it. It is difficult to replace. I repeated the urgency to go to the ED if she feels she is having stroke symptoms. She stated yesterday her hand, wrist started going numb so she didn't go to church. She has no slurred speech. She stated she will see NP on Thurs and if NP recommends testing she will have time to plan to remove her Dex com. I put her on wait list, high priority. Patient verbalized understanding, appreciation.

## 2021-02-07 NOTE — Progress Notes (Signed)
Carelink Summary Report / Loop Recorder 

## 2021-02-09 ENCOUNTER — Encounter: Payer: Self-pay | Admitting: Adult Health

## 2021-02-09 ENCOUNTER — Ambulatory Visit (INDEPENDENT_AMBULATORY_CARE_PROVIDER_SITE_OTHER): Payer: Medicare Other | Admitting: Adult Health

## 2021-02-09 VITALS — BP 142/66 | HR 69 | Ht 62.0 in | Wt 123.0 lb

## 2021-02-09 DIAGNOSIS — M542 Cervicalgia: Secondary | ICD-10-CM

## 2021-02-09 DIAGNOSIS — R270 Ataxia, unspecified: Secondary | ICD-10-CM

## 2021-02-09 DIAGNOSIS — R29898 Other symptoms and signs involving the musculoskeletal system: Secondary | ICD-10-CM

## 2021-02-09 DIAGNOSIS — I639 Cerebral infarction, unspecified: Secondary | ICD-10-CM | POA: Diagnosis not present

## 2021-02-09 NOTE — Patient Instructions (Addendum)
You will be called to schedule imaging of your head to rule out new stroke and cervical imaging   Continue aspirin 81 mg daily  and atorvastatin 80mg  daily  for secondary stroke prevention         Thank you for coming to see [place] at [facility]. I hope we have been able to provide you high quality care today.  You may receive a patient satisfaction survey over the next few weeks. We would appreciate your feedback and comments so that we may continue to improve ourselves and the health of our patients.

## 2021-02-09 NOTE — Progress Notes (Signed)
[facility] [address] [place]. [place] [address] [phone]       STROKE FOLLOW UP NOTE  [name] Date of Birth:  [birth year] Medical Record Number:  [id number]   Reason for Referral: stroke follow up    SUBJECTIVE:   CHIEF COMPLAINT:  Chief Complaint  Patient presents with   Hx of stroke    Rm 3, husband- [name]  "FU for worsening numbness in right hand"    HPI:   Update 02/09/2021 JM: patient being seen for acute visit for c/o worsening right hand numbness.  Accompanied by her husband.  Recently seen 2 months ago with complaints of intermittent right hand tingling since her stroke back in September. Reports over the past few weeks, has been experiencing more frequent episodes hand numbness but upon further discussion, more so weakness over numbness. This can last from 5-20 minutes. Will have difficulty holding objects or functioning with right hand. Denies any pain. No specific trigger identified. Symptoms do not pass elbow. Effects entire hand. No symptoms of RLE or any other associated neurological symptoms.  Does have chronic cervical pain, gradually worsening. Also reports on Sunday, episode of lightheadedness and increased fatigue associated with hand symptoms lasting approximately 20 minutes. Did not check BP or BG at that time. Does monitor BP otherwise routinely at home and on lower side since starting amlodipine last month (typically SBP 100-110). Does monitor BG routinely and denies any large fluctuation and levels have been stable.  Denies any hypoglycemia.  Compliant on aspirin and atorvastatin.  Recorder has not shown atrial fibrillation thus far.  No further concerns.    History provided for reference purposes only Initial visit 12/12/2020 JM: Mrs. Kendra Walker is being seen for initial hospital follow-up accompanied by her husband, Herbie [place].  Doing well since discharge without new stroke/TIA symptoms.  Reports occasional right hand tingling  present since her stroke but does not interfere with any activity or functioning.  Denies any weakness.    Completed 3 weeks DAPT -remains on aspirin alone and atorvastatin -denies side effects Blood pressure today 160/70 - occasionally monitors at home and normally SBP<130s.  Glucose levels have been good per pt - follows with endocrinology  Loop recorder has not shown atrial fibrillation thus far  No further concerns at this time  Stroke admission 10/14/2020 [name] is a 71 y.o. female with history of HLD, HTN, DM2, and PAD who presented on 10/14/2020 with Right UE weakness.  Personally reviewed hospitalization pertinent progress notes, lab work and pertinent imaging.  Evaluated by Dr. Leonie Man for small left cortical infarct, embolic secondary to unclear source. MRI also showed prior stroke on imaging. Loop recorder placed 9/26.  MRA head/neck bilateral ICA siphon atherosclerosis and moderate left ICA supraclinoid stenosis.  Carotid Doppler b/l 1 to 39% ICA stenosis.  EF 60 to 65% without cardiac source of embolus identified.  LDL 67.  A1c 8.0.  Recommended DAPT for 3 weeks and aspirin alone as well as resumed atorvastatin 80 mg daily.  PT/OT eval -no therapy needs.     PERTINENT IMAGING  Per recent hospitalization 10/14/2020 [facility] no acute normality MRI brain Punctate acute cortical infarct within the posterior left frontal lobe (motor strip).  Small chronic cortical infarcts within the left parietal occipital lobes and mild generalized cerebral atrophy MRA bilateral ICA siphon atherosclerosis and moderate left ICA supraclinoid stenosis. Carotid Doppler no significant bilateral extracranial carotid stenosis.   2D Echo - EF 60 - 65%. No cardiac  source of emboli identified.  S/p loop recorder 9/26 LDL 67 HgbA1c 8.0    ROS:   14 system review of systems performed and negative with exception of those listed in HPI  PMH:  Past Medical History:  Diagnosis Date   Cancer ([place])     Squamous Cell   Diabetes mellitus without complication ([place])    Hyperlipemia    Hypertension    Hypothyroidism    PAD (peripheral artery disease) ([place])    Stroke ([place])     PSH:  Past Surgical History:  Procedure Laterality Date   ABDOMINAL AORTOGRAM W/LOWER EXTREMITY N/A 06/09/2019   Procedure: ABDOMINAL AORTOGRAM W/LOWER EXTREMITY;  Surgeon: Serafina Mitchell, MD;  Location: [facility];  Service: Cardiovascular;  Laterality: N/A;   APPENDECTOMY     CATARACT EXTRACTION Bilateral    CORONARY ARTERY BYPASS GRAFT     LOOP RECORDER INSERTION N/A 10/17/2020   Procedure: LOOP RECORDER INSERTION;  Surgeon: Deboraha Sprang, MD;  Location: [facility];  Service: Cardiovascular;  Laterality: N/A;   PERIPHERAL VASCULAR INTERVENTION Bilateral 06/09/2019   Procedure: PERIPHERAL VASCULAR INTERVENTION;  Surgeon: Serafina Mitchell, MD;  Location: [facility];  Service: Cardiovascular;  Laterality: Bilateral;   TONSILLECTOMY     YAG LASER APPLICATION Right 1/63/8466   Procedure: YAG LASER APPLICATION;  Surgeon: Williams Che, MD;  Location: [facility];  Service: Ophthalmology;  Laterality: Right;    Social History:  Social History   Socioeconomic History   Marital status: Married    Spouse name: [name]   Number of children: Not on file   Years of education: Not on file   Highest education level: Not on file  Occupational History   Not on file  Tobacco Use   Smoking status: Never   Smokeless tobacco: Never  Vaping Use   Vaping Use: Never used  Substance and Sexual Activity   Alcohol use: No    Alcohol/week: 0.0 standard drinks   Drug use: No   Sexual activity: Not on file  Other Topics Concern   Not on file  Social History Narrative   Lives with husband   Social Determinants of Health   Financial Resource Strain: Not on file  Food Insecurity: Not on file  Transportation Needs: Not on file  Physical Activity: Not on file  Stress: Not on file  Social Connections:  Not on file  Intimate Partner Violence: Not on file    Family History:  Family History  Problem Relation Age of Onset   Heart disease Mother    Hypertension Mother    Hypertension Father     Medications:   Current Outpatient Medications on File Prior to Visit  Medication Sig Dispense Refill   acetaminophen (TYLENOL) 500 MG tablet Take 500 mg by mouth every 6 (six) hours as needed for mild pain or headache.      amLODipine (NORVASC) 5 MG tablet Take 2.5 mg by mouth daily.     Ascorbic Acid (VITAMIN C WITH ROSE HIPS) 500 MG tablet Take 500 mg by mouth at bedtime.     aspirin EC 81 MG tablet Take 81 mg by mouth daily.     atorvastatin (LIPITOR) 80 MG tablet Take 1 tablet (80 mg total) by mouth daily. (Patient taking differently: Take 80 mg by mouth every evening.) 30 tablet 6   Calcium Carbonate (CALCIUM 500 PO) 500 mg in the morning and at bedtime.     Calcium Carbonate (CALCIUM 600 PO)  Take 600 mg by mouth in the morning and at bedtime.     Cholecalciferol (VITAMIN D3) 50 MCG (2000 UT) TABS Take 2,000 Units by mouth daily.     fludrocortisone (FLORINEF) 0.1 MG tablet Take 0.05 mg by mouth daily.     GLUCAGEN HYPOKIT 1 MG SOLR injection Inject into the muscle.     HUMALOG 100 UNIT/ML injection Inject into the skin See admin instructions. VIA INSULIN PUMP     Insulin Human (INSULIN PUMP) SOLN Inject into the skin. HUMALOG 100 UNIT/ML     iron polysaccharides (NIFEREX) 150 MG capsule Take 150 mg by mouth at bedtime.      levothyroxine (SYNTHROID) 112 MCG tablet Take 112 mcg by mouth daily.     magnesium chloride (SLOW-MAG) 64 MG TBEC SR tablet Take 1-2 tablets by mouth See admin instructions. TAKE 1 TABLET IN THE MORNING & TAKE 2 TABLETS AT NIGHT.     Multiple Vitamin (MULTIVITAMIN WITH MINERALS) TABS tablet Take 1 tablet by mouth daily.     Multiple Vitamins-Minerals (PRESERVISION AREDS 2 PO) Take 1 tablet by mouth daily.      Omega-3 Fatty Acids (FISH OIL) 1200 MG CAPS Take 1,200 mg by  mouth daily.     POLY-IRON 150 FORTE 150-25-1 MG-MCG-MG CAPS Take 1 capsule by mouth daily at 6 (six) AM.     predniSONE (DELTASONE) 5 MG tablet Take 5 mg by mouth daily with breakfast.     sodium bicarbonate 650 MG tablet Take 650 mg by mouth in the morning and at bedtime.     No current facility-administered medications on file prior to visit.    Allergies:   Allergies  Allergen Reactions   Bactrim [Sulfamethoxazole-Trimethoprim] Other (See Comments)    Elevates potassium.    Tape Other (See Comments)    not allergic but causes sensitivity.   Fluorescein Rash   Procrit [Epoetin (Alfa)] Rash      OBJECTIVE:  Physical Exam  Vitals:   02/09/21 1108  BP: (!) 142/66  Pulse: 69  Weight: 123 lb (55.8 kg)  Height: 5\' 2"  (1.575 m)   Body mass index is 22.5 kg/m. No results found.  General: well developed, well nourished, very pleasant elderly Caucasian female, seated, in no evident distress Head: head normocephalic and atraumatic.   Neck: supple with no carotid or supraclavicular bruits Cardiovascular: regular rate and rhythm, no murmurs Musculoskeletal: no deformity Skin:  no rash/petichiae Vascular:  Normal pulses all extremities   Neurologic Exam Mental Status: Awake and fully alert.  Fluent speech and language.  Oriented to place and time. Recent and remote memory intact. Attention span, concentration and fund of knowledge appropriate. Mood and affect appropriate.  Cranial Nerves: Pupils equal, briskly reactive to light. Extraocular movements full without nystagmus. Visual fields full to confrontation. Hearing intact. Facial sensation intact. Face, tongue, palate moves normally and symmetrically.  Motor: Normal bulk and tone. Normal strength in all tested extremity muscles except decreased right hand grip strength and finger dexterity Sensory.: intact to touch , pinprick , position and vibratory sensation except right hand decreased pinprick sensation distally.   Coordination: Rapid alternating movements normal in all extremities except decreased right hand. Finger-to-nose mild RUE ataxia and heel-to-shin performed accurately bilaterally. Gait and Station: Arises from chair without difficulty. Stance is normal. Gait demonstrates normal stride length and mild imbalance (chronic) with use of cane. Tandem walk and heel toe not attempted Reflexes: 1+ and symmetric. Toes downgoing.        ASSESSMENT:  Kendra Walker is a 71 y.o. year old female small left cortical infarct on 1/32/4401 likely embolic secondary to unclear source s/p ILR. Vascular risk factors include prior stroke on imaging, HTN, HLD, DM type 1 and PAD. Returns today with c/o increased transient hand weakness/numbness    PLAN:  Transient right hand weakness: Present after stroke but worsening in frequency past 2-3 weeks. Evidence of hand and dexterity weakness, RUE ataxia and sensory deficit on exam today not previously noted. DDx residual from prior stroke VS new stroke VS cervical etiology Repeat MR brain and obtain MR cervical  If both unremarkable, may consider EMG/NCV Discussed proceeding to ED immediately with any new or worsening stroke/TIA symptoms  Cryptogenic stroke:  Continue aspirin 81 mg daily  and atorvastatin 80 mg daily for secondary stroke prevention.   Loop recorder has not shown atrial fibrillation thus far Discussed secondary stroke prevention measures and importance of close PCP follow up for aggressive stroke risk factor management. I have gone over the pathophysiology of stroke, warning signs and symptoms, risk factors and their management in some detail with instructions to go to the closest emergency room for symptoms of concern. HTN: BP goal <130/90.  Slightly elevated today but on lower side at home. Advised monitoring and to monitor BP with transient hand symptoms and lightheadedness symptoms.  Continue to follow with PCP for monitoring and management HLD:  LDL goal <70. Prior LDL 67 on atorvastatin 80 mg daily per PCP/cardiology.  DMI: A1c goal<7.0.  Recent A1c 7.5 down from 8.0 routinely followed by endocrinology.     Follow-up in May as scheduled   CC:  [facility] provider: Dr. Leonie Man PCP: Curlene Labrum, MD    I spent 38 minutes of face-to-face and non-face-to-face time with patient and husband.  This included previsit chart review, lab review, study review, electronic health record documentation, patient education regarding worsening hand symptoms and possible etiology, hx of prior stroke including secondary stroke prevention measures and importance of managing stroke risk factors and answered all other questions to patient satisfaction   Frann Rider, AGNP-BC  [facility] [address] [place], [place] [address]  Phone [phone] Fax [phone] Note: This document was prepared with digital dictation and possible smart phrase technology. Any transcriptional errors that result from this process are unintentional.

## 2021-02-13 ENCOUNTER — Telehealth: Payer: Self-pay | Admitting: Adult Health

## 2021-02-13 NOTE — Telephone Encounter (Signed)
Medicare/mutual of [place] no auth req order sent to [facility], if the pacemaker is safe they will reach out to the patient to schedule.

## 2021-02-14 DIAGNOSIS — E1142 Type 2 diabetes mellitus with diabetic polyneuropathy: Secondary | ICD-10-CM | POA: Diagnosis not present

## 2021-02-14 DIAGNOSIS — L97522 Non-pressure chronic ulcer of other part of left foot with fat layer exposed: Secondary | ICD-10-CM | POA: Diagnosis not present

## 2021-03-03 DIAGNOSIS — Z20822 Contact with and (suspected) exposure to covid-19: Secondary | ICD-10-CM | POA: Diagnosis not present

## 2021-03-04 LAB — CUP PACEART REMOTE DEVICE CHECK
Date Time Interrogation Session: 20230210230623
Implantable Pulse Generator Implant Date: 20220926

## 2021-03-06 ENCOUNTER — Ambulatory Visit (HOSPITAL_COMMUNITY)
Admission: RE | Admit: 2021-03-06 | Discharge: 2021-03-06 | Disposition: A | Discharge status: Home / Self Care | Payer: Medicare Other | Source: Ambulatory Visit | Attending: Adult Health | Admitting: Adult Health

## 2021-03-06 ENCOUNTER — Ambulatory Visit (INDEPENDENT_AMBULATORY_CARE_PROVIDER_SITE_OTHER): Payer: Medicare Other

## 2021-03-06 ENCOUNTER — Other Ambulatory Visit: Payer: Self-pay

## 2021-03-06 DIAGNOSIS — M4802 Spinal stenosis, cervical region: Secondary | ICD-10-CM | POA: Diagnosis not present

## 2021-03-06 DIAGNOSIS — M542 Cervicalgia: Secondary | ICD-10-CM | POA: Insufficient documentation

## 2021-03-06 DIAGNOSIS — M47812 Spondylosis without myelopathy or radiculopathy, cervical region: Secondary | ICD-10-CM | POA: Diagnosis not present

## 2021-03-06 DIAGNOSIS — R29898 Other symptoms and signs involving the musculoskeletal system: Secondary | ICD-10-CM | POA: Diagnosis not present

## 2021-03-06 DIAGNOSIS — R270 Ataxia, unspecified: Secondary | ICD-10-CM | POA: Diagnosis not present

## 2021-03-06 DIAGNOSIS — I639 Cerebral infarction, unspecified: Secondary | ICD-10-CM

## 2021-03-06 DIAGNOSIS — M2578 Osteophyte, vertebrae: Secondary | ICD-10-CM | POA: Diagnosis not present

## 2021-03-06 DIAGNOSIS — G9389 Other specified disorders of brain: Secondary | ICD-10-CM | POA: Diagnosis not present

## 2021-03-06 DIAGNOSIS — M50221 Other cervical disc displacement at C4-C5 level: Secondary | ICD-10-CM | POA: Diagnosis not present

## 2021-03-06 IMAGING — MR MR CERVICAL SPINE W/O CM
5 series · 34 of 48 positions shown · non-contrast
Comparison: MRI of the brain [DATE].
COMPARISON: Plain films [DATE].
COMPARISON: MRI of the brain [DATE].

Addendum:
CLINICAL DATA: Cryptogenic stroke (HCC) [T3] ([T3]-CM); stroke,
follow up; Neuro deficit, acute, stroke suspected. Right hand
weakness [T3] ([T3]-CM); cervicalgia [T3] ([T3]-CM); ataxia
of right upper extremity [T3] ([T3]-CM).

EXAM:
MRI HEAD WITHOUT CONTRAST
TECHNIQUE: Multiplanar, multiecho pulse sequences of the brain and surrounding
structures were obtained without intravenous contrast.
TECHNIQUE: Multiplanar, multiecho pulse sequences of the cervical spine and
surrounding structures were obtained without intravenous contrast.

[Series 5: T2 · sagittal · 3.0mm · 0.69mm/px · 6 of 15 slices shown (1 of 2)]
[im 1/15]
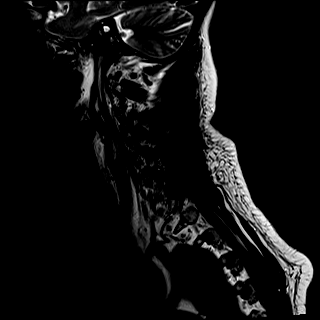
[im 3/15]
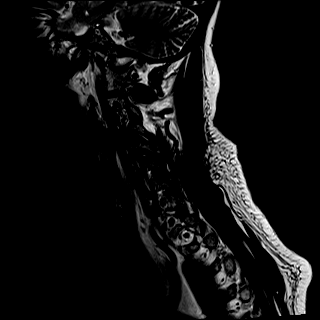
[im 6/15]
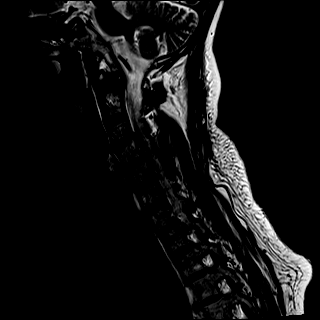
[im 9/15]
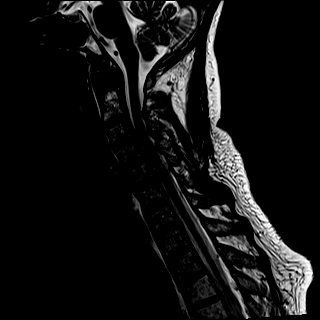
[im 12/15]
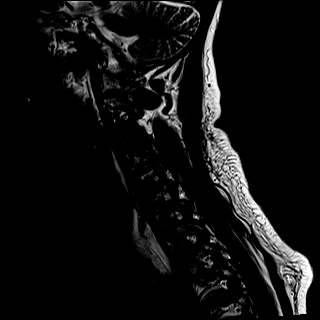
[im 15/15]
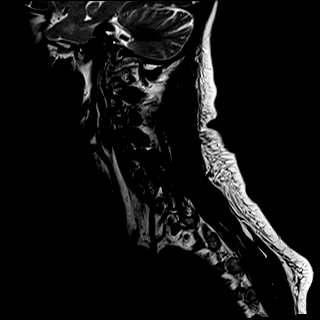

[Series 6: T1 · sagittal · 3.0mm · 0.86mm/px · 6 of 15 slices shown]
[im 1/15]
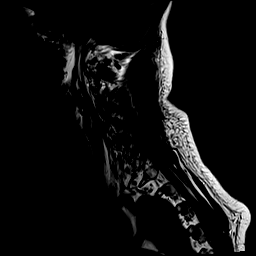
[im 3/15]
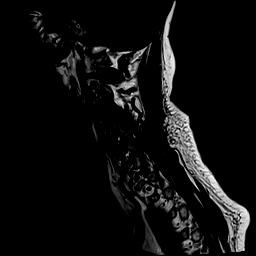
[im 6/15]
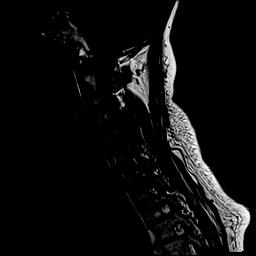
[im 9/15]
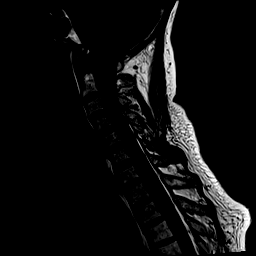
[im 12/15]
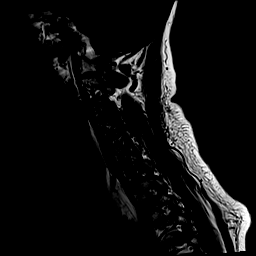
[im 15/15]
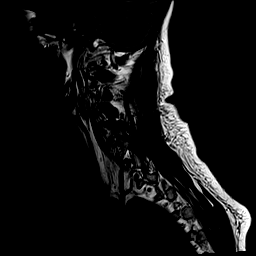

[Series 7: STIR · sagittal · 3.0mm · 0.69mm/px · 6 of 15 slices shown]
[im 1/15]
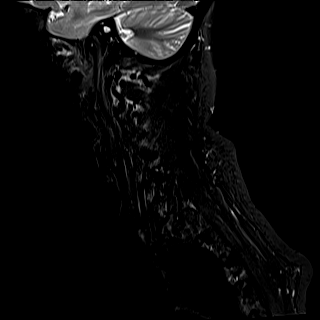
[im 3/15]
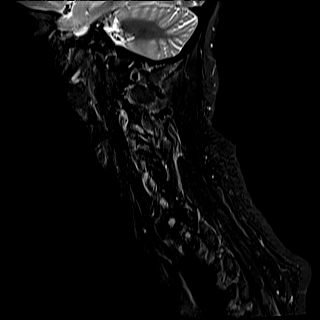
[im 6/15]
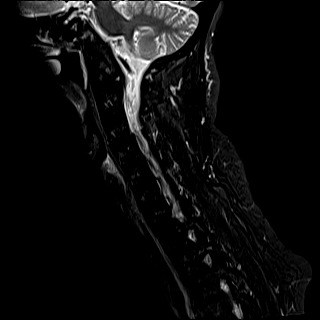
[im 9/15]
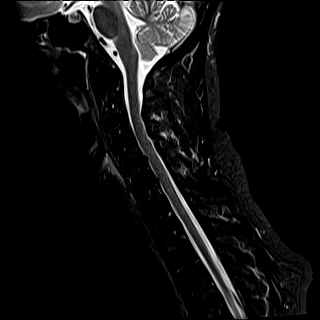
[im 12/15]
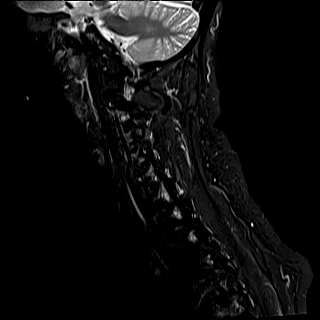
[im 15/15]
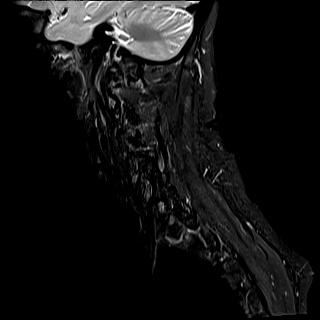

[Series 8: T2 · axial · 3.0mm · 0.70mm/px · z∈[-92,+13]mm · 9 of 35 slices shown (2 of 2)]
[im 1/35]
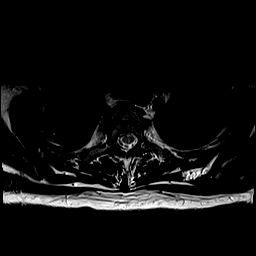
[im 5/35]
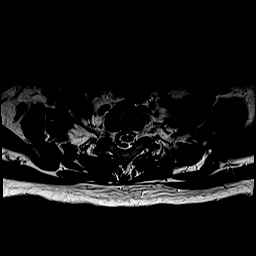
[im 10/35]
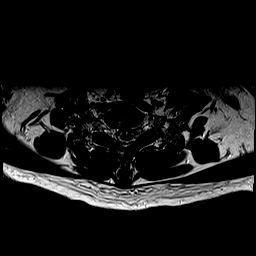
[im 15/35]
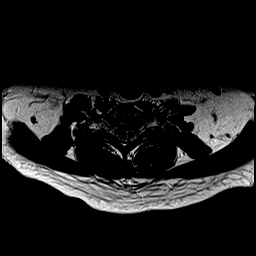
[im 18/35]
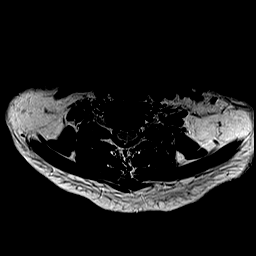
[im 20/35]
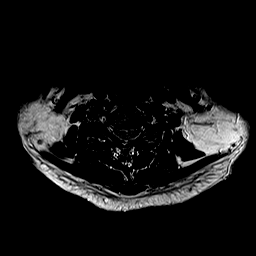
[im 25/35]
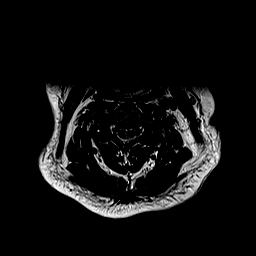
[im 30/35]
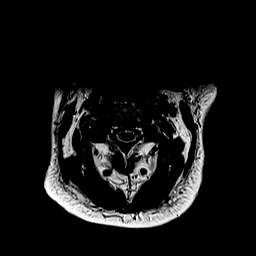
[im 35/35]
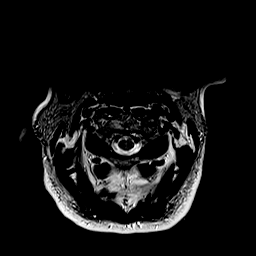

[Series 9: GRE · axial · 3.0mm · 0.35mm/px · z∈[-92,-2]mm · 7 of 35 slices shown]
[im 1/35]
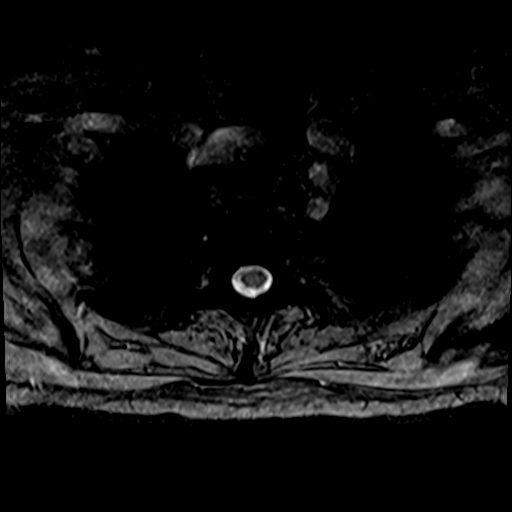
[im 5/35]
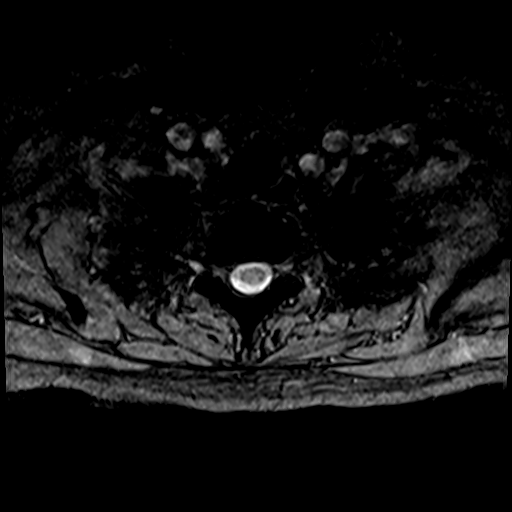
[im 10/35]
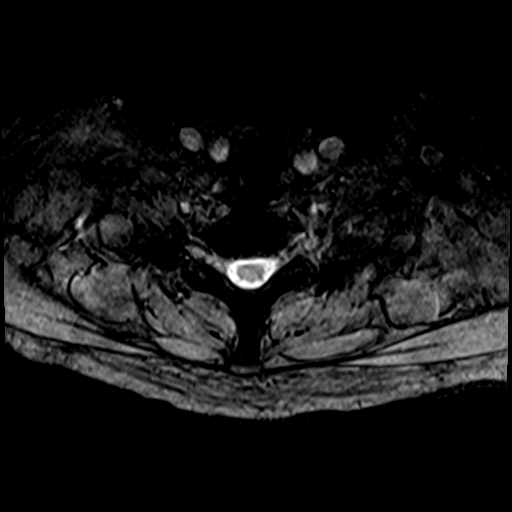
[im 15/35]
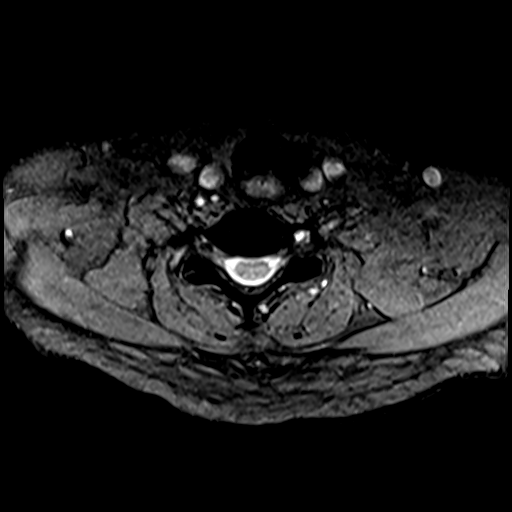
[im 20/35]
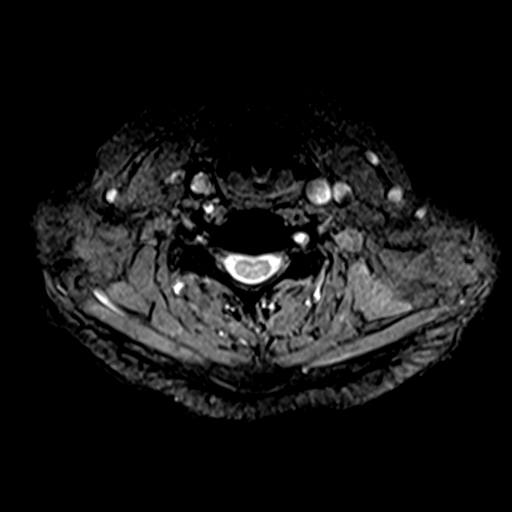
[im 25/35]
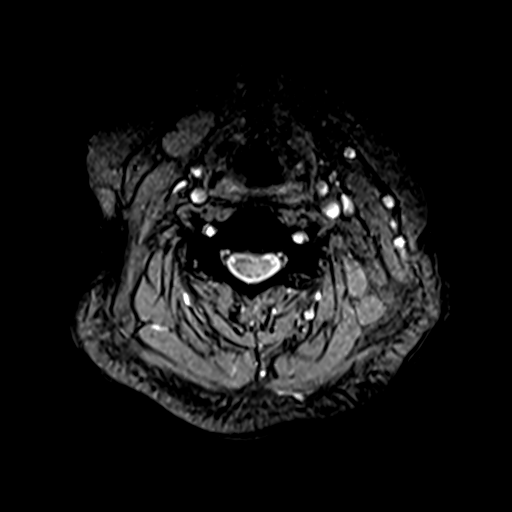
[im 30/35]
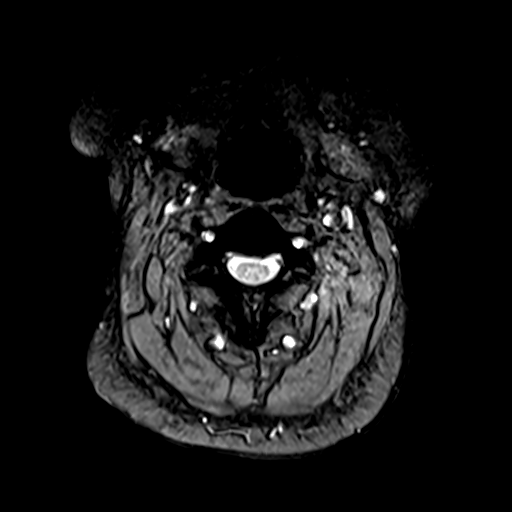

[34 of 48 positions shown; findings below may reference images not displayed]

FINDINGS: Brain: Punctate focus of restricted diffusion in the posterior left
parietal lobe, at the periphery of area of encephalomalacia and
gliosis related to prior infarct, consistent with acute on chronic
infarct. No other focus of restricted diffusion. Remote cortical
infarct in the left frontal lobe. Few scattered foci of
susceptibility artifact within the bilateral cerebral hemispheres,
consistent with hemosiderin deposits. No acute hemorrhage,
hydrocephalus, extra-axial collection or mass lesion.

Vascular: Normal flow voids.

Skull and upper cervical spine: Normal marrow signal.

Sinuses/Orbits: Negative.

Other: None.
IMPRESSION: 1. Punctate focus of restricted diffusion consistent with
acute/subacute infarct in the left parietal region superimposed on
area of chronic infarct.
2. Remote small left frontal and parietal infarcts.

EXAM:
MRI CERVICAL SPINE WITHOUT CONTRAST
FINDINGS: Alignment: Straightening of the cervical curvature.

Vertebrae: No fracture, evidence of discitis or bone lesion.

Spinal cord: Fall normal signal and morphology.

Posterior fossa, vertebral artery and paraspinal soft tissues:
Unremarkable.

Disc levels:

C2-3: Mild facet degenerative changes. No significant spinal canal
or neural foraminal stenosis.

C3-4: Small posterior disc protrusion resulting mild spinal canal
stenosis. Uncovertebral and facet degenerative changes resulting in
moderate right and mild left neural foraminal narrowing.

C4-5: Posterior disc osteophyte complex resulting in
mild-to-moderate spinal canal stenosis. Uncovertebral and facet
degenerative changes resulting in moderate right and
mild-to-moderate left neural foraminal narrowing.

C5-6: Posterior disc osteophyte complex resulting in mild spinal
canal stenosis. Uncovertebral and facet degenerative change
resulting mild right neural foraminal narrowing.

C6-7: Small posterior disc protrusion without significant spinal
canal stenosis. Mild uncovertebral and facet degenerative changes
without significant neural foraminal narrowing.

C7-T1: Mild facet degenerative changes. No significant spinal canal
or neural foraminal stenosis.
IMPRESSION: 1. Degenerative changes of the cervical spine resulting in
mild-to-moderate spinal canal stenosis at C4-5 and mild spinal canal
stenosis at C3-4 and C5-6.
2. Moderate right and mild-to-moderate left neural foraminal
narrowing at C4-5.
3. Moderate right and mild left neural foraminal narrowing at C3-4.

*** End of Addendum ***
FINDINGS: Brain: Punctate focus of restricted diffusion in the posterior left
parietal lobe, at the periphery of area of encephalomalacia and
gliosis related to prior infarct, consistent with acute on chronic
infarct. No other focus of restricted diffusion. Remote cortical
infarct in the left frontal lobe. Few scattered foci of
susceptibility artifact within the bilateral cerebral hemispheres,
consistent with hemosiderin deposits. No acute hemorrhage,
hydrocephalus, extra-axial collection or mass lesion.

Vascular: Normal flow voids.

Skull and upper cervical spine: Normal marrow signal.

Sinuses/Orbits: Negative.

Other: None.
IMPRESSION: 1. Punctate focus of restricted diffusion consistent with
acute/subacute infarct in the left parietal region superimposed on
area of chronic infarct.
2. Remote small left frontal and parietal infarcts.

## 2021-03-06 IMAGING — MR MR HEAD W/O CM
12 of 13 series · 42 of 48 positions shown · non-contrast
Comparison: MRI of the brain [DATE].
COMPARISON: Plain films [DATE].
COMPARISON: MRI of the brain [DATE].

Addendum:
CLINICAL DATA: Cryptogenic stroke (HCC) [T3] ([T3]-CM); stroke,
follow up; Neuro deficit, acute, stroke suspected. Right hand
weakness [T3] ([T3]-CM); cervicalgia [T3] ([T3]-CM); ataxia
of right upper extremity [T3] ([T3]-CM).

EXAM:
MRI HEAD WITHOUT CONTRAST
TECHNIQUE: Multiplanar, multiecho pulse sequences of the brain and surrounding
structures were obtained without intravenous contrast.
TECHNIQUE: Multiplanar, multiecho pulse sequences of the cervical spine and
surrounding structures were obtained without intravenous contrast.

[Series 5: DWI · axial · 4.0mm · 0.88mm/px · z∈[-68,+70]mm · 5 of 36 slices shown (1 of 6)]
[im 1/36]
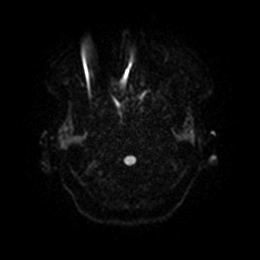
[im 9/36]
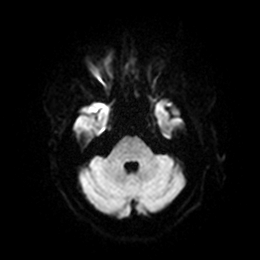
[im 18/36]
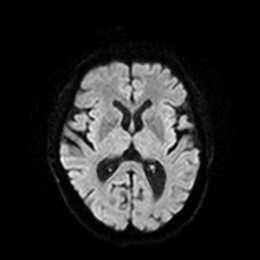
[im 27/36]
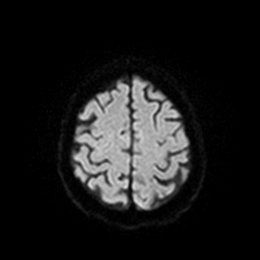
[im 36/36]
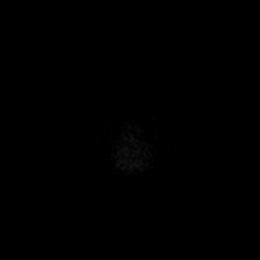

[Series 5: DWI · axial · 4.0mm · 0.88mm/px · z∈[-68,+70]mm · 5 of 36 slices shown (2 of 6)]
[im 1/36]
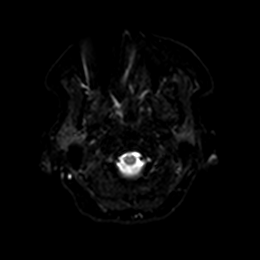
[im 9/36]
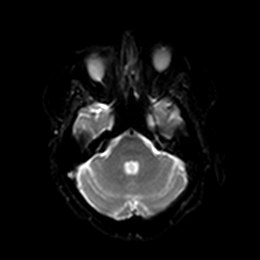
[im 18/36]
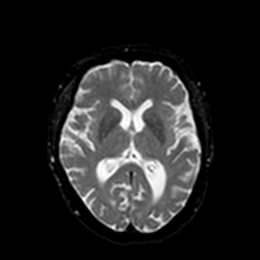
[im 27/36]
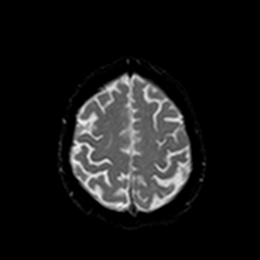
[im 36/36]
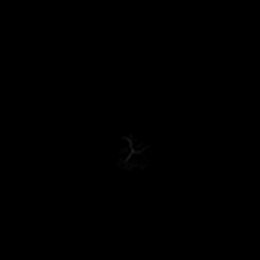

[Series 6: DWI · axial · 4.0mm · 0.88mm/px · z∈[-68,+70]mm · 5 of 36 slices shown (3 of 6)]
[im 1/36]
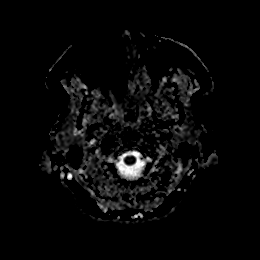
[im 9/36]
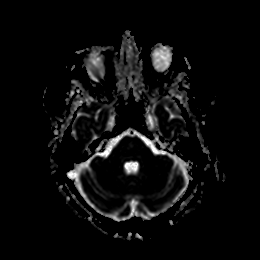
[im 18/36]
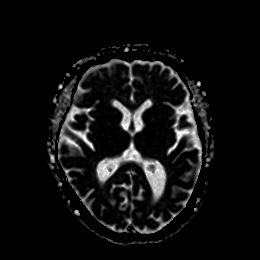
[im 27/36]
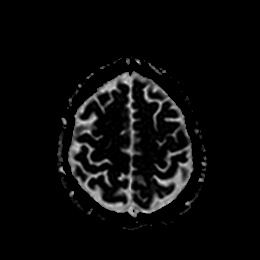
[im 36/36]
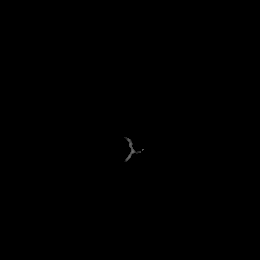

[Series 7: DWI · coronal · 5.0mm · 0.88mm/px · 3 of 28 slices shown (4 of 6)]
[im 1/28]
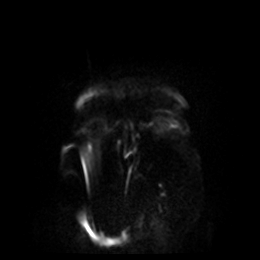
[im 14/28]
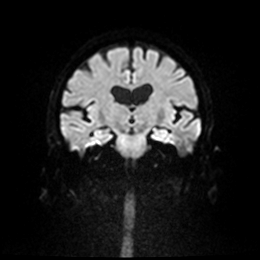
[im 28/28]
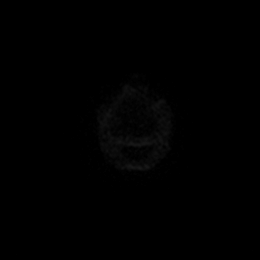

[Series 7: DWI · coronal · 5.0mm · 0.88mm/px · 3 of 28 slices shown (5 of 6)]
[im 1/28]
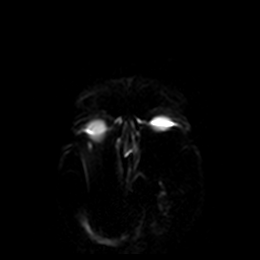
[im 14/28]
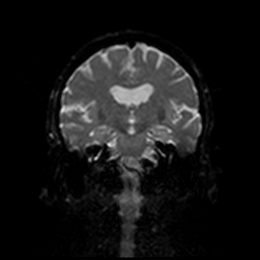
[im 28/28]
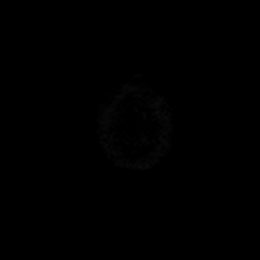

[Series 8: DWI · coronal · 5.0mm · 0.88mm/px · 3 of 28 slices shown (6 of 6)]
[im 1/28]
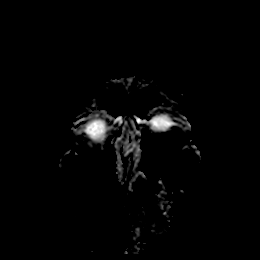
[im 14/28]
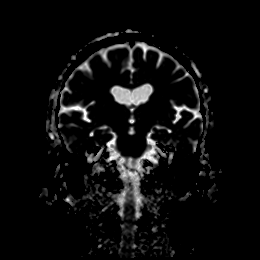
[im 28/28]
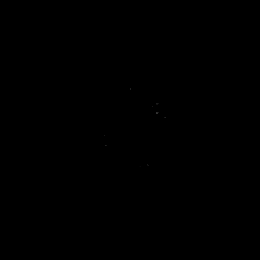

[Series 9: T1 · sagittal · 5.0mm · 0.94mm/px · 3 of 21 slices shown]
[im 1/21]
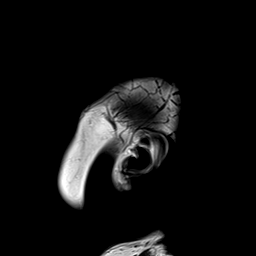
[im 11/21]
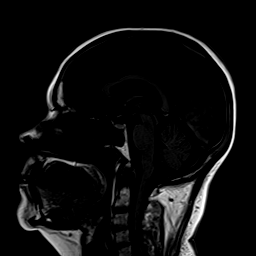
[im 21/21]
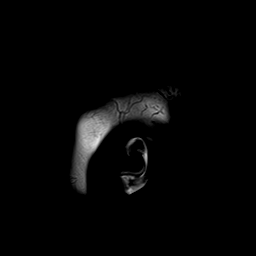

[Series 10: T2 · axial · 5.0mm · 0.72mm/px · z∈[-64,+67]mm · 2 of 20 slices shown (1 of 2)]
[im 1/20]
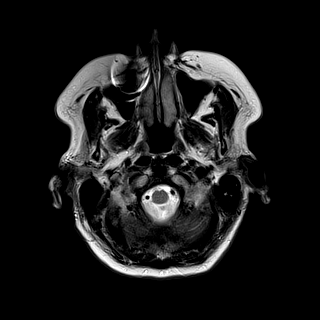
[im 20/20]
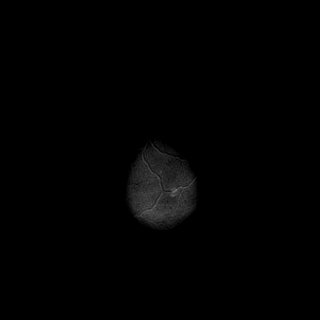

[Series 11: ax hemo · axial · 5.0mm · 0.86mm/px · z∈[-68,+74]mm · 3 of 25 slices shown]
[im 1/25]
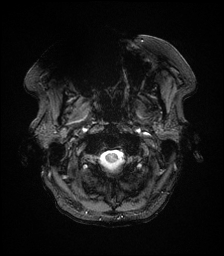
[im 13/25]
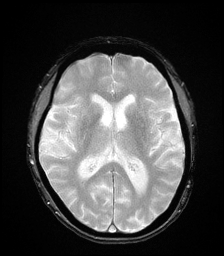
[im 25/25]
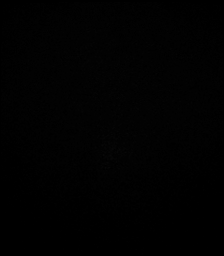

[Series 12: FLAIR · axial · 4.0mm · 0.43mm/px · z∈[-69,+69]mm · 4 of 36 slices shown (1 of 2)]
[im 1/36]
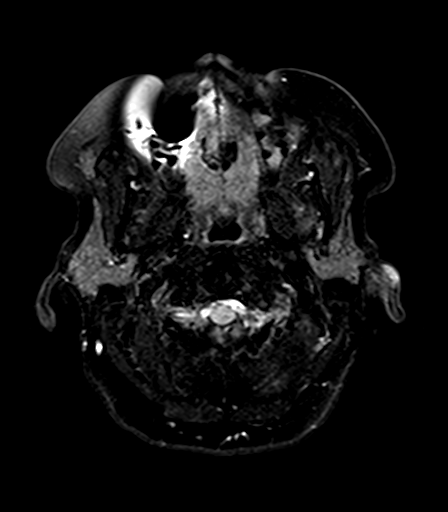
[im 12/36]
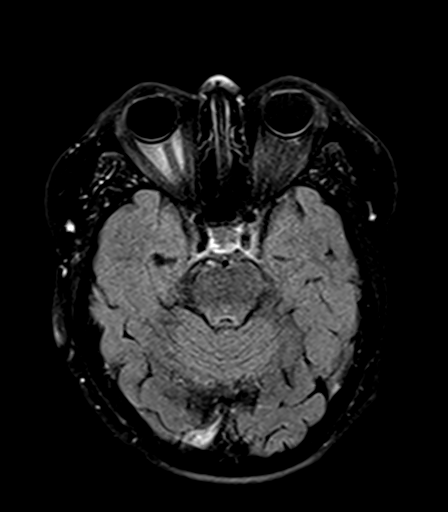
[im 24/36]
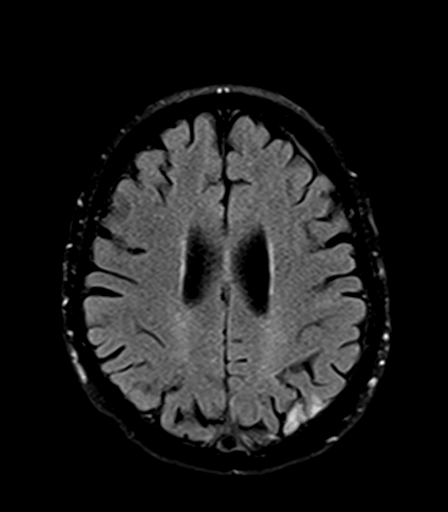
[im 36/36]
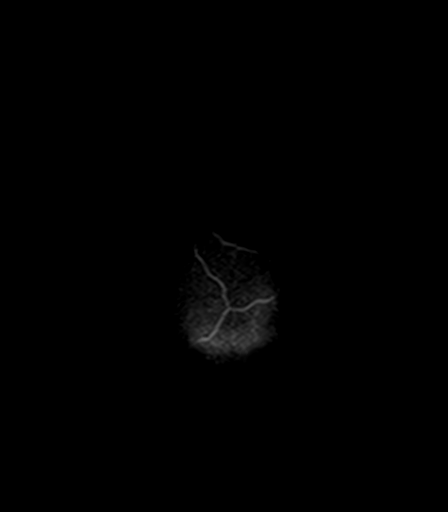

[Series 14: T2 · coronal · 5.0mm · 0.72mm/px · 3 of 28 slices shown (2 of 2)]
[im 1/28]
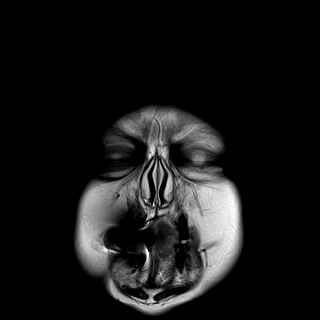
[im 14/28]
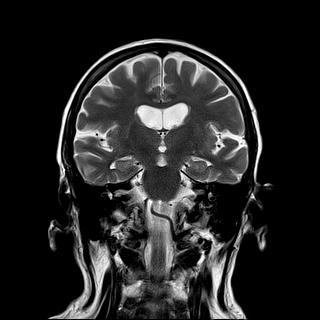
[im 28/28]
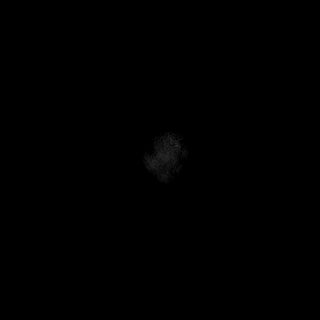

[Series 15: FLAIR · sagittal · 5.0mm · 0.94mm/px · 3 of 21 slices shown (2 of 2)]
[im 1/21]
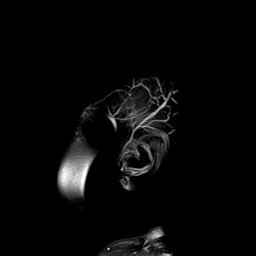
[im 11/21]
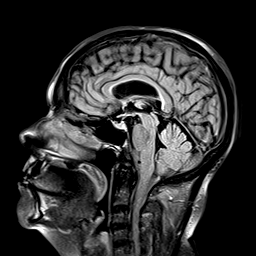
[im 21/21]
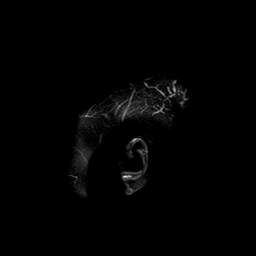

[42 of 48 positions shown; findings below may reference images not displayed]

FINDINGS: Brain: Punctate focus of restricted diffusion in the posterior left
parietal lobe, at the periphery of area of encephalomalacia and
gliosis related to prior infarct, consistent with acute on chronic
infarct. No other focus of restricted diffusion. Remote cortical
infarct in the left frontal lobe. Few scattered foci of
susceptibility artifact within the bilateral cerebral hemispheres,
consistent with hemosiderin deposits. No acute hemorrhage,
hydrocephalus, extra-axial collection or mass lesion.

Vascular: Normal flow voids.

Skull and upper cervical spine: Normal marrow signal.

Sinuses/Orbits: Negative.

Other: None.
IMPRESSION: 1. Punctate focus of restricted diffusion consistent with
acute/subacute infarct in the left parietal region superimposed on
area of chronic infarct.
2. Remote small left frontal and parietal infarcts.

EXAM:
MRI CERVICAL SPINE WITHOUT CONTRAST
FINDINGS: Alignment: Straightening of the cervical curvature.

Vertebrae: No fracture, evidence of discitis or bone lesion.

Spinal cord: Fall normal signal and morphology.

Posterior fossa, vertebral artery and paraspinal soft tissues:
Unremarkable.

Disc levels:

C2-3: Mild facet degenerative changes. No significant spinal canal
or neural foraminal stenosis.

C3-4: Small posterior disc protrusion resulting mild spinal canal
stenosis. Uncovertebral and facet degenerative changes resulting in
moderate right and mild left neural foraminal narrowing.

C4-5: Posterior disc osteophyte complex resulting in
mild-to-moderate spinal canal stenosis. Uncovertebral and facet
degenerative changes resulting in moderate right and
mild-to-moderate left neural foraminal narrowing.

C5-6: Posterior disc osteophyte complex resulting in mild spinal
canal stenosis. Uncovertebral and facet degenerative change
resulting mild right neural foraminal narrowing.

C6-7: Small posterior disc protrusion without significant spinal
canal stenosis. Mild uncovertebral and facet degenerative changes
without significant neural foraminal narrowing.

C7-T1: Mild facet degenerative changes. No significant spinal canal
or neural foraminal stenosis.
IMPRESSION: 1. Degenerative changes of the cervical spine resulting in
mild-to-moderate spinal canal stenosis at C4-5 and mild spinal canal
stenosis at C3-4 and C5-6.
2. Moderate right and mild-to-moderate left neural foraminal
narrowing at C4-5.
3. Moderate right and mild left neural foraminal narrowing at C3-4.

*** End of Addendum ***
FINDINGS: Brain: Punctate focus of restricted diffusion in the posterior left
parietal lobe, at the periphery of area of encephalomalacia and
gliosis related to prior infarct, consistent with acute on chronic
infarct. No other focus of restricted diffusion. Remote cortical
infarct in the left frontal lobe. Few scattered foci of
susceptibility artifact within the bilateral cerebral hemispheres,
consistent with hemosiderin deposits. No acute hemorrhage,
hydrocephalus, extra-axial collection or mass lesion.

Vascular: Normal flow voids.

Skull and upper cervical spine: Normal marrow signal.

Sinuses/Orbits: Negative.

Other: None.
IMPRESSION: 1. Punctate focus of restricted diffusion consistent with
acute/subacute infarct in the left parietal region superimposed on
area of chronic infarct.
2. Remote small left frontal and parietal infarcts.

## 2021-03-07 ENCOUNTER — Telehealth: Payer: Self-pay | Admitting: Adult Health

## 2021-03-07 DIAGNOSIS — R29898 Other symptoms and signs involving the musculoskeletal system: Secondary | ICD-10-CM

## 2021-03-07 DIAGNOSIS — I639 Cerebral infarction, unspecified: Secondary | ICD-10-CM

## 2021-03-07 MED ORDER — CLOPIDOGREL BISULFATE 75 MG PO TABS: 75.0000 mg | ORAL_TABLET | Freq: Every day | ORAL | 3 refills | Status: DC

## 2021-03-07 NOTE — Telephone Encounter (Signed)
Contacted patient regarding recent MRI brainwhich was completed due to complaints of worsening right hand weakness.  MRI brain showed a acute/subacute infarct in the left parietal region superimposed on area of chronic infarct as well as remote small left frontal and parietal infarcts. She continues to have fluctuating right arm numbness and weakness but does report on 2/10, episode of complete right arm flaccidity that lasted for approximately 1 minute and then resolved. She did not seek emergent evaluation as symptoms resolved and MRI was already scheduled on 2/13.  Most recent loop recorder summary 2/10 without any evidence of A-fib or abnormal arrhythmias. Worsening RUE weakness likely in setting of new stroke and low suspicion related to cervical etiology  In setting of acute/subacute stroke, recommend aspirin and Plavix for 3 weeks then Plavix alone as currently on aspirin 81mg  daily. Will request lab work be sent to review from PCP office (reports lab work last month). Will obtain MRA head and carotid ultrasound (unable to receive contrast due to kidney disease). Referral will be placed to OT for dominant right hand weakness. Advised any new or worsening stroke/TIA symptoms, she needs to call [phone] immediately for emergent evaluation.      MR BRAIN WO CONTRAST 03/06/2021 IMPRESSION: 1. Punctate focus of restricted diffusion consistent with acute/subacute infarct in the left parietal region superimposed on area of chronic infarct. 2. Remote small left frontal and parietal infarcts.   MR CERVICAL 03/06/2021 IMPRESSION: 1. Degenerative changes of the cervical spine resulting in mild-to-moderate spinal canal stenosis at C4-5 and mild spinal canal stenosis at C3-4 and C5-6. 2. Moderate right and mild-to-moderate left neural foraminal narrowing at C4-5. 3. Moderate right and mild left neural foraminal narrowing at C3-4.

## 2021-03-07 NOTE — Telephone Encounter (Signed)
[place]icare/[employer] [place] order sent to [facility] to be scheduled at [facility]. They will reach out to the patient to schedule.

## 2021-03-08 NOTE — Telephone Encounter (Signed)
I have requested the Hemoglobin A1C and cholesterol panel from Dr. Pleas Koch.

## 2021-03-08 NOTE — Progress Notes (Signed)
Carelink Summary Report / Loop Recorder 

## 2021-03-08 NOTE — Telephone Encounter (Signed)
We received recent lab report from Dr. Lizbeth Bark office. Results placed in NP's office for review.

## 2021-03-09 NOTE — Telephone Encounter (Signed)
Lab work reviewed.  Completed 01/26/2021  Lipid panel Total cholesterol 173 Triglycerides 54 HDL 91 VLDL 11 LDL 71  A1c 7.6  TSH 0.455  CMP (abnormal listed only) Glucose 200 BUN 47 Creatinine 1.49 eGFR 38 BUN/creatinine ratio 32 Potassium 5.3 Protein, total 5.9  CBC (abnormal listed only) Eos 0.7

## 2021-03-13 DIAGNOSIS — E10649 Type 1 diabetes mellitus with hypoglycemia without coma: Secondary | ICD-10-CM | POA: Diagnosis not present

## 2021-03-16 ENCOUNTER — Encounter (HOSPITAL_COMMUNITY): Payer: Self-pay

## 2021-03-16 ENCOUNTER — Ambulatory Visit (HOSPITAL_COMMUNITY): Payer: Medicare Other | Attending: Pulmonary Disease

## 2021-03-16 ENCOUNTER — Other Ambulatory Visit: Payer: Self-pay

## 2021-03-16 ENCOUNTER — Telehealth: Payer: Self-pay | Admitting: Internal Medicine

## 2021-03-16 DIAGNOSIS — R278 Other lack of coordination: Secondary | ICD-10-CM | POA: Insufficient documentation

## 2021-03-16 DIAGNOSIS — R29898 Other symptoms and signs involving the musculoskeletal system: Secondary | ICD-10-CM | POA: Insufficient documentation

## 2021-03-16 NOTE — Patient Instructions (Signed)
Theraputty Home Exercise Program  Complete 1-2 times a day.  putty squeeze  Pt. should squeeze putty in hand trying to keep it round by rotating putty after each squeeze. push fingers through putty to palm each time. Complete for __3-5____ minutes.   PUTTY KEY GRIP  Hold the putty at the top of your hand. Squeeze the putty between your thumb and the side of your 2nd finger as shown. Complete for ____3-5____ minutes.    PUTTY 3 JAW CHUCK  Roll up some putty into a ball then flatten it. Then, firmly squeeze it with your first 3 fingers as shown. Complete for __3-5____ minutes.      Coordination Activities  Perform the following activities for 10-15 minutes 1 times per day with right hand(s).  Rotate ball in fingertips (clockwise and counter-clockwise). Toss ball between hands. Toss ball in air and catch with the same hand. Flip cards 1 at a time as fast as you can. Deal cards with your thumb (Hold deck in hand and push card off top with thumb). Rotate card in hand (clockwise and counter-clockwise). Shuffle cards. Pick up coins one at a time until you get 5-10 in your hand, then move coins from palm to fingertips to stack one at a time.   Complete the following once a day. Complete 10-15 repetitions.   1) Strengthening: Chest Pull - Resisted   Hold Theraband in front of body with hands about shoulder width a part. Pull band a part and back together slowly.     Copyright  VHI. All rights reserved.   2) PNF Strengthening: Resisted   Standing with resistive band around each hand, bring right arm up and away, thumb back.          3) Resisted External Rotation: in Neutral - Bilateral   Sit or stand, tubing in both hands, elbows at sides, bent to 90, forearms forward. Pinch shoulder blades together and rotate forearms out. Keep elbows at sides.   http://orth.exer.us/966   Copyright  VHI. All rights reserved.   4) PNF Strengthening:  Resisted   Standing, hold resistive band above head. Bring right arm down and out from side.   http://orth.exer.us/922   Copyright  VHI. All rights reserved.

## 2021-03-16 NOTE — Telephone Encounter (Signed)
°  1. Has your device fired? no  2. Is you device beeping? no  3. Are you experiencing draining or swelling at device site? no  4. Are you calling to see if we received your device transmission? no  5. Have you passed out? no  Patient states she has an MRI on Monday and would like to know if the office needs to pull any information from her loop recorder.   Please route to [place]

## 2021-03-16 NOTE — Telephone Encounter (Signed)
Spoke with patient informed her that her home monitor would update and send information if needed early Monday morning patient voiced understanding.

## 2021-03-18 NOTE — Therapy (Signed)
[facility] [place], [place], [address] Phone: [phone]   Fax:  [phone]  Occupational Therapy Evaluation  Patient Details  Name: Kendra Walker MRN: [id number] Date of Birth: 1950/06/05 Referring Provider (OT): Frann Rider, NP   Encounter Date: 03/16/2021   OT End of Session - 03/18/21 1106     Visit Number 1    Number of Visits 1    Authorization Type 1) Medicare 2) [employer] Medicare    Authorization Time Period follow medicare guidelines    OT Start Time [phone]    OT Stop Time 1028    OT Time Calculation (min) 39 min    Activity Tolerance Patient tolerated treatment well    Behavior During Therapy [facility] for tasks assessed/performed             Past Medical History:  Diagnosis Date   Cancer ([place])    Squamous Cell   Diabetes mellitus without complication ([place])    Hyperlipemia    Hypertension    Hypothyroidism    PAD (peripheral artery disease) ([place])    Stroke [facility])     Past Surgical History:  Procedure Laterality Date   ABDOMINAL AORTOGRAM W/LOWER EXTREMITY N/A 06/09/2019   Procedure: ABDOMINAL AORTOGRAM W/LOWER EXTREMITY;  Surgeon: Serafina Mitchell, MD;  Location: [facility];  Service: Cardiovascular;  Laterality: N/A;   APPENDECTOMY     CATARACT EXTRACTION Bilateral    CORONARY ARTERY BYPASS GRAFT     LOOP RECORDER INSERTION N/A 10/17/2020   Procedure: LOOP RECORDER INSERTION;  Surgeon: Deboraha Sprang, MD;  Location: [facility];  Service: Cardiovascular;  Laterality: N/A;   PERIPHERAL VASCULAR INTERVENTION Bilateral 06/09/2019   Procedure: PERIPHERAL VASCULAR INTERVENTION;  Surgeon: Serafina Mitchell, MD;  Location: [facility];  Service: Cardiovascular;  Laterality: Bilateral;   TONSILLECTOMY     YAG LASER APPLICATION Right 0/53/9767   Procedure: YAG LASER APPLICATION;  Surgeon: Williams Che, MD;  Location: [facility];  Service: Ophthalmology;  Laterality: Right;     There were no vitals filed for this visit.   Subjective Assessment - 03/18/21 1054     Pertinent History Patient is a 71 y/o female S/P right hand weakness due to recurrent strokes. First one in Sept. 2022 and the most recent end of Jan/beginning of Feb 2023. Recent MRI was completed on 03/06/21.  MRI brain showed an acute/subacute infarct in the left parietal region superimposed on area of chronic infarct as well as remote small left frontal and parietal infarcts. She reports episodes of have fluctuating right arm numbness and weakness. One episode of complete right arm flaccidity that lasted for approximately 1 minute and then resolved. Frann Rider, NP has referred patient to occupational therapy for evaluation and treatment.    Patient Stated Goals To learn of what she could do for her right arm when she has these episodes occur.    Currently in Pain? No/denies               [facility] OT Assessment - 03/18/21 1059       Assessment   Medical Diagnosis right hand weakness    Referring Provider (OT) Frann Rider, NP    Onset Date/Surgical Date --   sept 2022, jan/feb 2023   Hand Dominance Right    Next MD Visit --   May 2023   Prior Therapy None      Precautions   Precautions None  Restrictions   Weight Bearing Restrictions No      Balance Screen   Has the patient fallen in the past 6 months No      Home  Environment   Family/patient expects to be discharged to: Private residence      Prior Function   Level of Independence Independent;Requires assistive device for independence    Vocation Retired      Public librarian Status Independent      Written Expression   Dominant Hand Right      Vision - History   Baseline Vision Wears glasses all the time      Cognition   Overall Cognitive Status Within Functional Limits for tasks assessed      Observation/Other Assessments   Focus on Therapeutic Outcomes (FOTO)  N/A      Sensation   Light Touch Appears  Intact    Stereognosis Appears Intact    Hot/Cold Appears Intact    Proprioception Appears Intact      Coordination   Gross Motor Movements are Fluid and Coordinated Yes    Fine Motor Movements are Fluid and Coordinated Yes      ROM / Strength   AROM / PROM / Strength AROM;Strength      AROM   Overall AROM  Within functional limits for tasks performed    Overall AROM Comments RUE all ranges.      Strength   Strength Assessment Site Hand;Forearm;Elbow;Shoulder    Right/Left Shoulder Right    Right Shoulder Flexion 4/5    Right Shoulder ABduction 4-/5    Right Shoulder Internal Rotation 4+/5    Right Shoulder External Rotation 4/5    Right/Left Elbow Right    Right Elbow Flexion 5/5    Right Elbow Extension 4+/5    Right/Left Forearm Right    Right Forearm Pronation 4+/5    Right Forearm Supination 4+/5    Right/Left hand Right;Left    Right Hand Grip (lbs) 15    Right Hand Lateral Pinch 8 lbs    Right Hand 3 Point Pinch 6 lbs    Left Hand Grip (lbs) 30    Left Hand Lateral Pinch 13 lbs    Left Hand 3 Point Pinch 11 lbs                              OT Education - 03/18/21 1105     Education Details shoulder strengthening - yellow and red band, coordination activities, yellow putty - hand strength    Person(s) Educated Patient    Methods Explanation;Demonstration;Verbal cues;Handout    Comprehension Verbalized understanding              OT Short Term Goals - 03/18/21 1111       OT SHORT TERM GOAL #1   Title Patient will be educated and verbalize understanding of HEP in order to increase RUE strength and coordination and return to using it with less difficulty to complete all ADL tasks.    Time 1    Period Days    Status Achieved                      Plan - 03/18/21 1108     Clinical Impression Statement A: Patient is a 71 y/o female S/P right arm weakness and decreased coordination due to recurrent CVAs/TIAs. Developed a  HEP focusing on RUE strength, hand strength and coordination.  All education was completed. Pt verbalized understanding of HEP.    OT Occupational Profile and History Problem Focused Assessment - Including review of records relating to presenting problem    Occupational performance deficits (Please refer to evaluation for details): ADL's    Body Structure / Function / Physical Skills FMC;Strength    Rehab Potential Excellent    Clinical Decision Making Limited treatment options, no task modification necessary    Comorbidities Affecting Occupational Performance: Presence of comorbidities impacting occupational performance    Comorbidities impacting occupational performance description: recurrent CVAs/TIAs    Modification or Assistance to Complete Evaluation  No modification of tasks or assist necessary to complete eval    OT Frequency One time visit    OT Treatment/Interventions Patient/family education    Plan P: 1 time visit with HEP established.    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, bilateral ([place]) 10/13/2019   Advanced nonexudative age-related macular degeneration of both eyes without subfoveal involvement 10/13/2019   Bilateral epiretinal membrane 10/13/2019   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 choroidal neovascularization ([place]) 10/13/2019   Groin hematoma 06/09/2019    Colon cancer screening 09/16/2017   Severe diabetic hypoglycemia ([place]) 78/29/5621   Metabolic acidosis 30/86/5784   Elevated troponin 03/07/2016   Pneumonia 03/07/2016   Septic shock ([place]) 03/07/2016   CAD (coronary artery disease) 11/11/2013   HTN (hypertension) 11/11/2013   PAD (peripheral artery disease) ([place]) 11/11/2013   Hyperlipidemia 11/11/2013   Pyuria 05/12/2013   Hypomagnesemia 03/03/2012   Hypothyroidism 01/28/2012   Anemia 09/27/2011   Adrenal insufficiency (Addison's disease) ([place]) 08/31/2011   Wound, open, leg 07/19/2011   Chronic kidney disease, stage III (moderate) ([place]) 05/14/2011   Diabetic retinopathy ([place]) 11/27/2010   History of two vessel coronary artery bypass graft 11/27/2010   Hyperkalemia 11/27/2010   Iron deficiency 11/27/2010    Ailene Ravel, OTR/L,CBIS  [phone]  03/18/2021, 11:18 AM  [facility] [address] [place], [place], [address] Phone: [phone]   Fax:  [phone]  Name: Kendra Walker MRN: [id number] Date of Birth: 08-07-1950

## 2021-03-20 ENCOUNTER — Ambulatory Visit (HOSPITAL_COMMUNITY)
Admission: RE | Admit: 2021-03-20 | Discharge: 2021-03-20 | Disposition: A | Discharge status: Home / Self Care | Payer: Medicare Other | Source: Ambulatory Visit | Attending: Adult Health | Admitting: Adult Health

## 2021-03-20 ENCOUNTER — Other Ambulatory Visit: Payer: Self-pay

## 2021-03-20 DIAGNOSIS — I639 Cerebral infarction, unspecified: Secondary | ICD-10-CM | POA: Diagnosis not present

## 2021-03-20 DIAGNOSIS — I6523 Occlusion and stenosis of bilateral carotid arteries: Secondary | ICD-10-CM | POA: Diagnosis not present

## 2021-03-20 IMAGING — MR MR MRA HEAD W/O CM
1 series · 33 of 48 positions shown · non-contrast
Comparison: Brain MRI [DATE].  Intracranial MRA [DATE]

CLINICAL DATA: Stroke workup.  Recent abnormal brain MRI

EXAM:
MRA HEAD WITHOUT CONTRAST
TECHNIQUE: Angiographic images of the Circle of Willis were acquired using MRA
technique without intravenous contrast.

[Series 2: TOF · axial · 0.5mm · 0.54mm/px · z∈[-46,+32]mm · 33 of 184 slices shown]
[im 1/184]
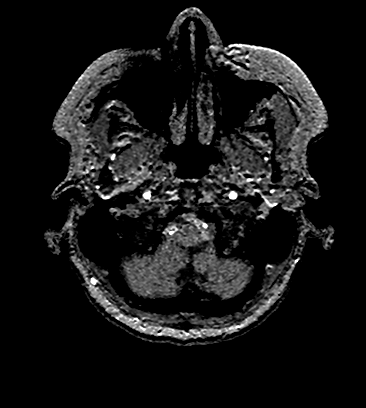
[im 4/184]
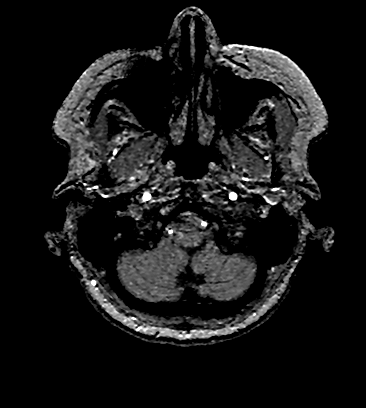
[im 8/184]
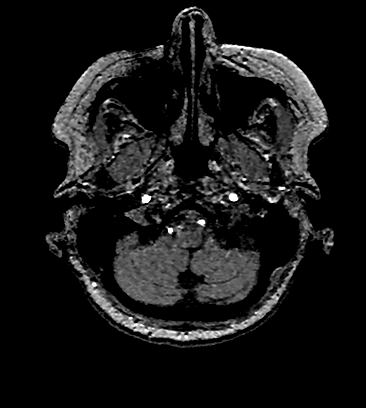
[im 12/184]
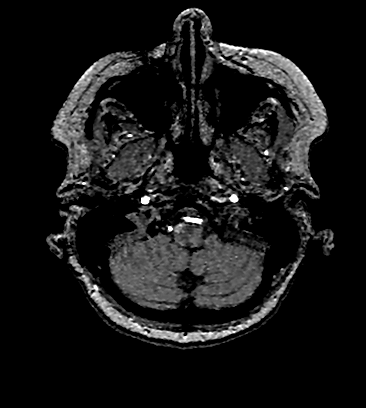
[im 16/184]
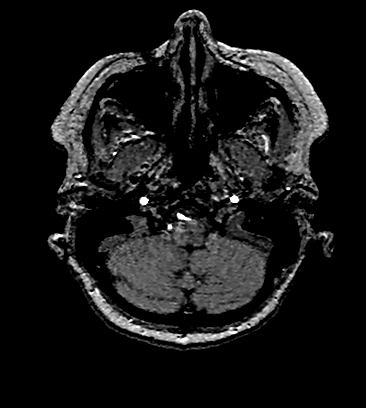
[im 20/184]
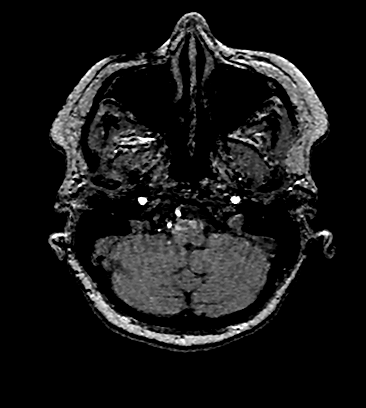
[im 24/184]
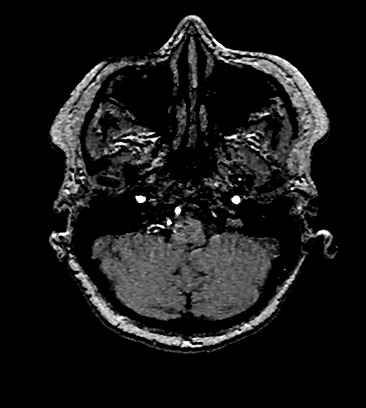
[im 28/184]
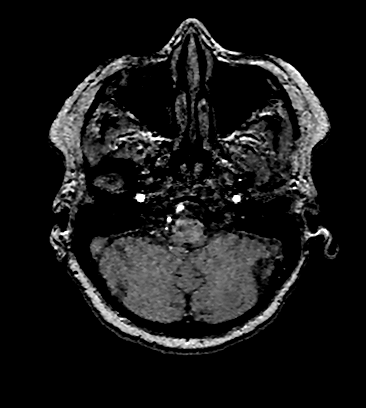
[im 32/184]
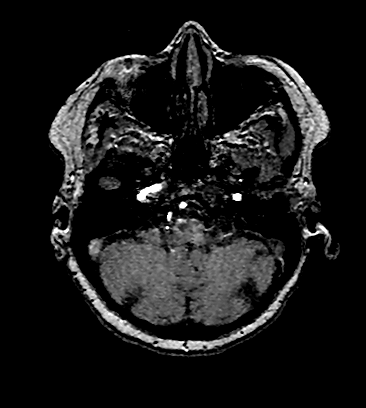
[im 36/184]
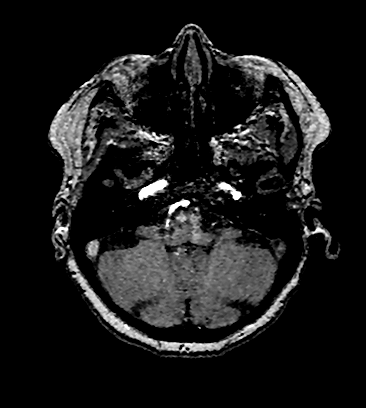
[im 39/184]
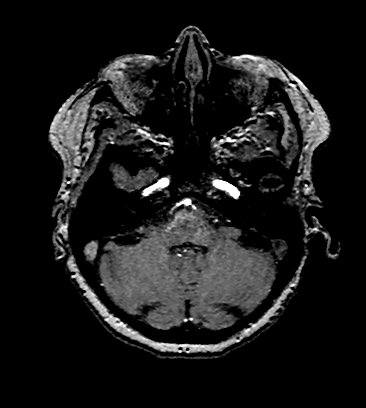
[im 43/184]
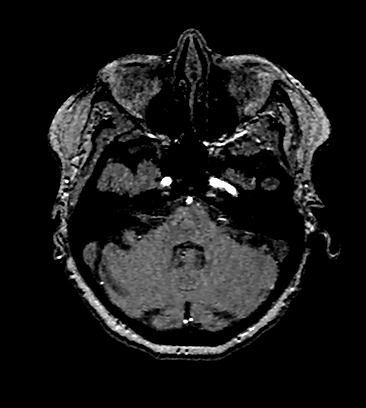
[im 47/184]
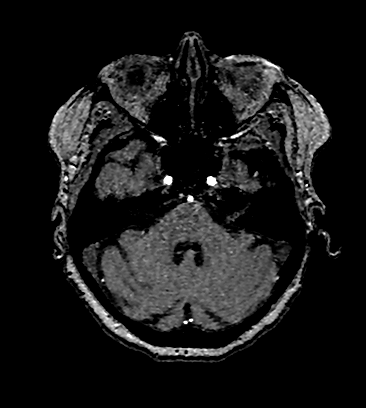
[im 51/184]
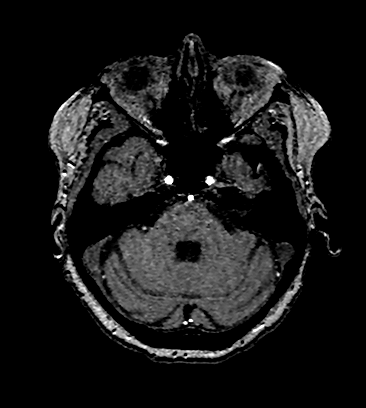
[im 55/184]
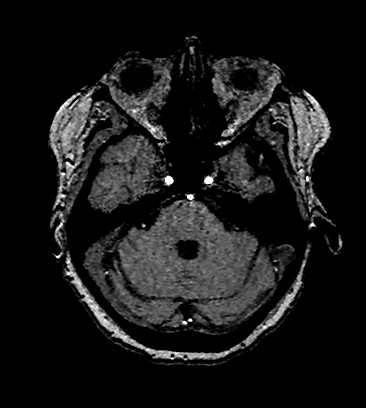
[im 59/184]
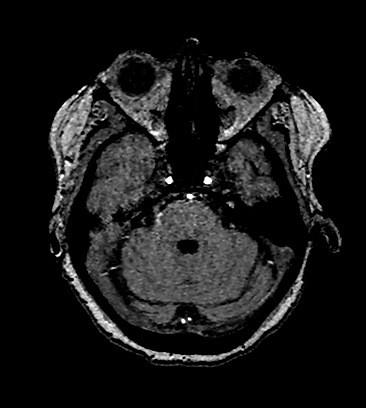
[im 63/184]
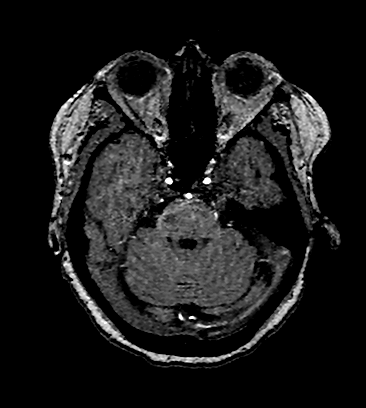
[im 67/184]
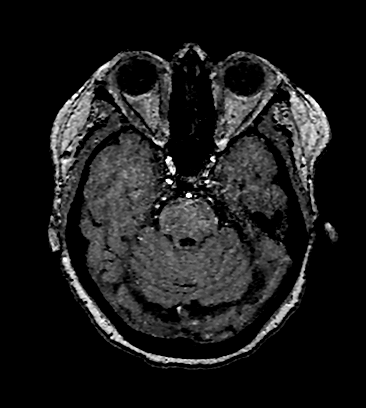
[im 71/184]
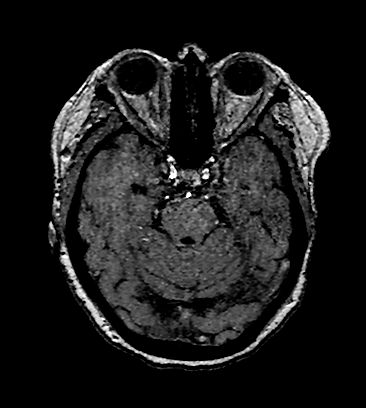
[im 74/184]
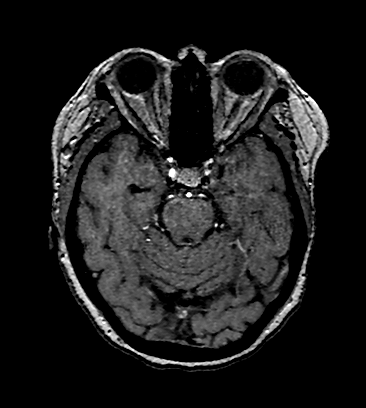
[im 78/184]
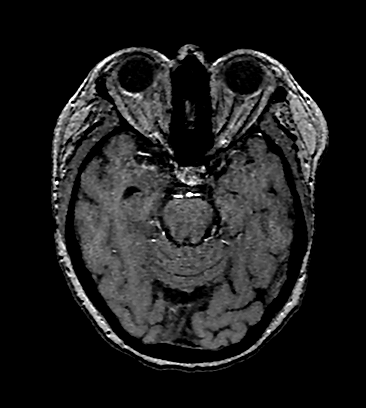
[im 82/184]
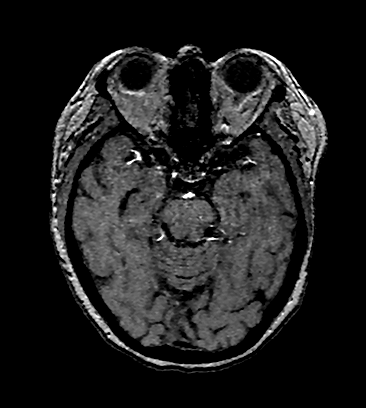
[im 86/184]
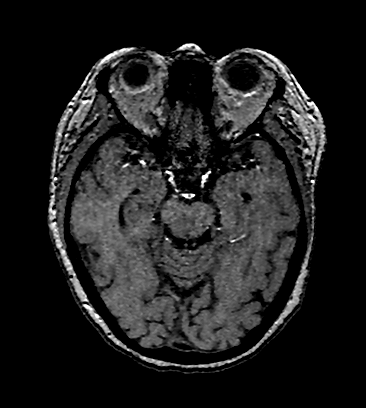
[im 90/184]
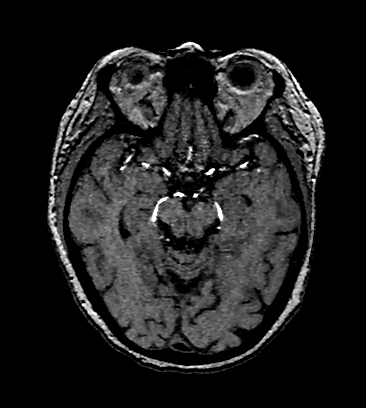
[im 94/184]
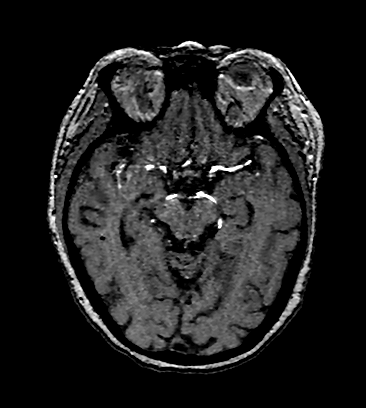
[im 98/184]
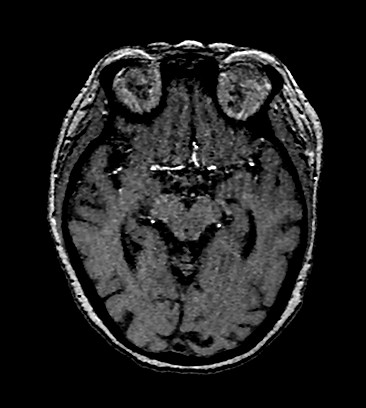
[im 102/184]
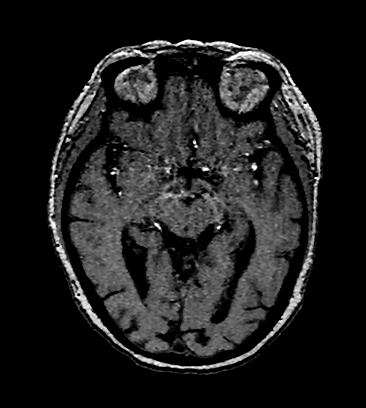
[im 106/184]
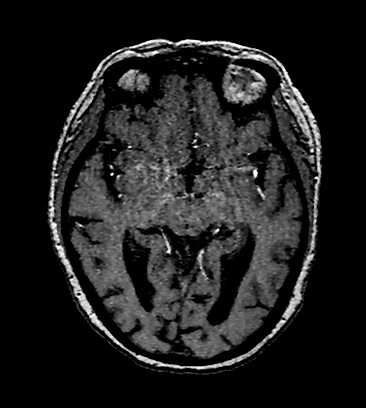
[im 110/184]
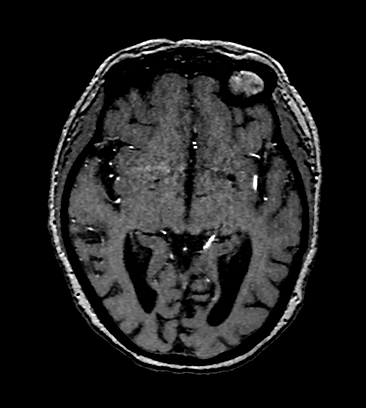
[im 129/184]
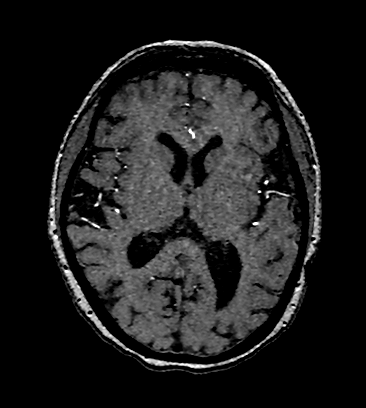
[im 152/184]
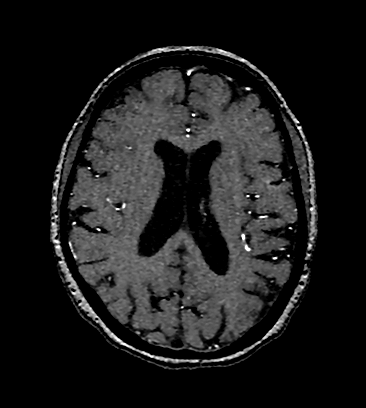
[im 156/184]
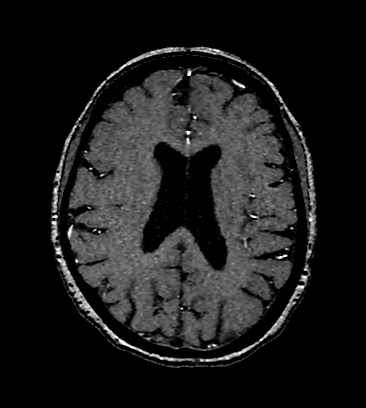
[im 176/184]
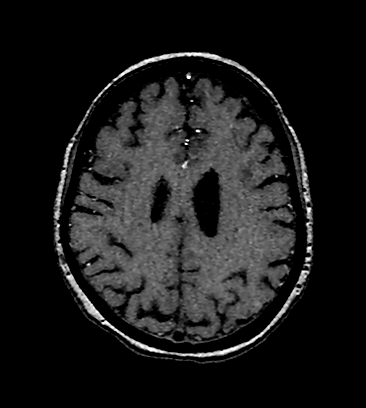

[33 of 48 positions shown; findings below may reference images not displayed]

FINDINGS: Anterior circulation: Atheromatous irregularity of the carotid
siphons with superimposed motion artifact further degrading signal.
Bilateral paraclinoid ICA stenosis which is likely overestimated. No
major branch occlusion seen. There is likely atheromatous
irregularity of medium size vessels accentuated by motion. No
detected aneurysm or vascular malformation.

Posterior circulation: Vertebral and basilar arteries are smoothly
contoured and widely patent. Diffuse irregular appearance of the
cerebellar and posterior cerebral arteries, likely combination of
atherosclerosis and artifact.

Anatomic variants: None significant
IMPRESSION: 1. No new finding when compared to [DATE] MRA and when
accounting for motion artifact.
2. Atherosclerosis most notably affecting the carotid siphons with
bilateral paraclinoid ICA stenosis which was moderate severity on
prior.

## 2021-03-21 DIAGNOSIS — L84 Corns and callosities: Secondary | ICD-10-CM | POA: Diagnosis not present

## 2021-03-21 DIAGNOSIS — E1342 Other specified diabetes mellitus with diabetic polyneuropathy: Secondary | ICD-10-CM | POA: Diagnosis not present

## 2021-03-27 DIAGNOSIS — H9201 Otalgia, right ear: Secondary | ICD-10-CM | POA: Diagnosis not present

## 2021-03-27 DIAGNOSIS — H6122 Impacted cerumen, left ear: Secondary | ICD-10-CM | POA: Diagnosis not present

## 2021-03-27 DIAGNOSIS — H6121 Impacted cerumen, right ear: Secondary | ICD-10-CM | POA: Diagnosis not present

## 2021-03-27 DIAGNOSIS — Z6822 Body mass index (BMI) 22.0-22.9, adult: Secondary | ICD-10-CM | POA: Diagnosis not present

## 2021-03-28 DIAGNOSIS — E1069 Type 1 diabetes mellitus with other specified complication: Secondary | ICD-10-CM | POA: Diagnosis not present

## 2021-03-28 DIAGNOSIS — E1022 Type 1 diabetes mellitus with diabetic chronic kidney disease: Secondary | ICD-10-CM | POA: Diagnosis not present

## 2021-03-28 DIAGNOSIS — E039 Hypothyroidism, unspecified: Secondary | ICD-10-CM | POA: Diagnosis not present

## 2021-03-28 DIAGNOSIS — R7989 Other specified abnormal findings of blood chemistry: Secondary | ICD-10-CM | POA: Diagnosis not present

## 2021-03-28 DIAGNOSIS — Z7982 Long term (current) use of aspirin: Secondary | ICD-10-CM | POA: Diagnosis not present

## 2021-03-28 DIAGNOSIS — N1831 Chronic kidney disease, stage 3a: Secondary | ICD-10-CM | POA: Diagnosis not present

## 2021-03-28 DIAGNOSIS — I129 Hypertensive chronic kidney disease with stage 1 through stage 4 chronic kidney disease, or unspecified chronic kidney disease: Secondary | ICD-10-CM | POA: Diagnosis not present

## 2021-03-28 DIAGNOSIS — E271 Primary adrenocortical insufficiency: Secondary | ICD-10-CM | POA: Diagnosis not present

## 2021-03-28 DIAGNOSIS — Z79890 Hormone replacement therapy (postmenopausal): Secondary | ICD-10-CM | POA: Diagnosis not present

## 2021-03-28 DIAGNOSIS — Z9641 Presence of insulin pump (external) (internal): Secondary | ICD-10-CM | POA: Diagnosis not present

## 2021-03-28 DIAGNOSIS — Z79899 Other long term (current) drug therapy: Secondary | ICD-10-CM | POA: Diagnosis not present

## 2021-03-28 DIAGNOSIS — E10649 Type 1 diabetes mellitus with hypoglycemia without coma: Secondary | ICD-10-CM | POA: Diagnosis not present

## 2021-04-04 DIAGNOSIS — Z20822 Contact with and (suspected) exposure to covid-19: Secondary | ICD-10-CM | POA: Diagnosis not present

## 2021-04-05 ENCOUNTER — Telehealth: Payer: Self-pay | Admitting: Adult Health

## 2021-04-05 NOTE — Telephone Encounter (Signed)
Called Melanie, LVM requesting call back. ?

## 2021-04-05 NOTE — Telephone Encounter (Signed)
Melanie @ [facility] is calling for clearance for deep cleaning or need for any abstractions as a result of history of TIA Ph [phone] fax [phone] ?

## 2021-04-05 NOTE — Telephone Encounter (Signed)
Patient was recently found to have a new stroke on imaging and should currently be on Plavix.  Recommend holding off on any dental procedures for at least 6 months post stroke unless emergent extraction is required where benefit would outweigh risk. ?

## 2021-04-06 NOTE — Telephone Encounter (Signed)
Letter approved by NP and has been faxed to requested number.  ?Confirmation received.  ?

## 2021-04-06 NOTE — Telephone Encounter (Addendum)
Kendra Walker at [facility] called back and she and I discussed message. She is asking for a letter to be faxed with Np's response to # [phone].  ? ?I have formulated a letter stating "Recommend holding off on any dental procedures for at least 6 months post 03/06/2021 stroke unless emergent extraction is required where benefit would outweigh risk". ? ?Letter placed on NP's desk for review and signature if appropriate.  ?

## 2021-04-10 ENCOUNTER — Ambulatory Visit (INDEPENDENT_AMBULATORY_CARE_PROVIDER_SITE_OTHER): Payer: Medicare Other

## 2021-04-10 DIAGNOSIS — I639 Cerebral infarction, unspecified: Secondary | ICD-10-CM

## 2021-04-10 LAB — CUP PACEART REMOTE DEVICE CHECK
Date Time Interrogation Session: 20230319230718
Implantable Pulse Generator Implant Date: 20220926

## 2021-04-13 ENCOUNTER — Ambulatory Visit (INDEPENDENT_AMBULATORY_CARE_PROVIDER_SITE_OTHER): Payer: Medicare Other

## 2021-04-13 DIAGNOSIS — I6389 Other cerebral infarction: Secondary | ICD-10-CM

## 2021-04-17 ENCOUNTER — Telehealth: Payer: Self-pay

## 2021-04-17 NOTE — Telephone Encounter (Signed)
Contacted pt, informed her of results and advised to call office back with questions as she had none at the time  ?

## 2021-04-17 NOTE — Telephone Encounter (Signed)
-----   Message from Frann Rider, NP sent at 04/13/2021  4:54 PM EDT ----- ?Recent carotid ultrasound did not show any evidence of carotid stenosis or narrowing.  ?

## 2021-04-18 DIAGNOSIS — H6121 Impacted cerumen, right ear: Secondary | ICD-10-CM | POA: Diagnosis not present

## 2021-04-18 DIAGNOSIS — Z6822 Body mass index (BMI) 22.0-22.9, adult: Secondary | ICD-10-CM | POA: Diagnosis not present

## 2021-04-18 DIAGNOSIS — H9201 Otalgia, right ear: Secondary | ICD-10-CM | POA: Diagnosis not present

## 2021-04-18 NOTE — Progress Notes (Signed)
Carelink Summary Report / Loop Recorder 

## 2021-04-19 DIAGNOSIS — L729 Follicular cyst of the skin and subcutaneous tissue, unspecified: Secondary | ICD-10-CM | POA: Diagnosis not present

## 2021-04-20 DIAGNOSIS — Z20822 Contact with and (suspected) exposure to covid-19: Secondary | ICD-10-CM | POA: Diagnosis not present

## 2021-04-24 DIAGNOSIS — E039 Hypothyroidism, unspecified: Secondary | ICD-10-CM | POA: Diagnosis not present

## 2021-04-24 DIAGNOSIS — E7849 Other hyperlipidemia: Secondary | ICD-10-CM | POA: Diagnosis not present

## 2021-04-24 DIAGNOSIS — Z1159 Encounter for screening for other viral diseases: Secondary | ICD-10-CM | POA: Diagnosis not present

## 2021-04-24 DIAGNOSIS — I10 Essential (primary) hypertension: Secondary | ICD-10-CM | POA: Diagnosis not present

## 2021-04-24 DIAGNOSIS — E1121 Type 2 diabetes mellitus with diabetic nephropathy: Secondary | ICD-10-CM | POA: Diagnosis not present

## 2021-04-24 DIAGNOSIS — E782 Mixed hyperlipidemia: Secondary | ICD-10-CM | POA: Diagnosis not present

## 2021-04-24 DIAGNOSIS — N184 Chronic kidney disease, stage 4 (severe): Secondary | ICD-10-CM | POA: Diagnosis not present

## 2021-04-24 DIAGNOSIS — R5383 Other fatigue: Secondary | ICD-10-CM | POA: Diagnosis not present

## 2021-04-27 DIAGNOSIS — I10 Essential (primary) hypertension: Secondary | ICD-10-CM | POA: Diagnosis not present

## 2021-04-27 DIAGNOSIS — E1021 Type 1 diabetes mellitus with diabetic nephropathy: Secondary | ICD-10-CM | POA: Diagnosis not present

## 2021-04-27 DIAGNOSIS — N184 Chronic kidney disease, stage 4 (severe): Secondary | ICD-10-CM | POA: Diagnosis not present

## 2021-04-27 DIAGNOSIS — I639 Cerebral infarction, unspecified: Secondary | ICD-10-CM | POA: Diagnosis not present

## 2021-04-27 DIAGNOSIS — J8410 Pulmonary fibrosis, unspecified: Secondary | ICD-10-CM | POA: Diagnosis not present

## 2021-04-27 DIAGNOSIS — E7849 Other hyperlipidemia: Secondary | ICD-10-CM | POA: Diagnosis not present

## 2021-04-27 DIAGNOSIS — I739 Peripheral vascular disease, unspecified: Secondary | ICD-10-CM | POA: Diagnosis not present

## 2021-04-27 DIAGNOSIS — E875 Hyperkalemia: Secondary | ICD-10-CM | POA: Diagnosis not present

## 2021-05-03 DIAGNOSIS — Z20822 Contact with and (suspected) exposure to covid-19: Secondary | ICD-10-CM | POA: Diagnosis not present

## 2021-05-06 DIAGNOSIS — Z20822 Contact with and (suspected) exposure to covid-19: Secondary | ICD-10-CM | POA: Diagnosis not present

## 2021-05-15 ENCOUNTER — Ambulatory Visit (INDEPENDENT_AMBULATORY_CARE_PROVIDER_SITE_OTHER): Payer: Medicare Other

## 2021-05-15 DIAGNOSIS — Z20822 Contact with and (suspected) exposure to covid-19: Secondary | ICD-10-CM | POA: Diagnosis not present

## 2021-05-15 DIAGNOSIS — L237 Allergic contact dermatitis due to plants, except food: Secondary | ICD-10-CM | POA: Diagnosis not present

## 2021-05-15 DIAGNOSIS — I639 Cerebral infarction, unspecified: Secondary | ICD-10-CM | POA: Diagnosis not present

## 2021-05-15 DIAGNOSIS — Z6823 Body mass index (BMI) 23.0-23.9, adult: Secondary | ICD-10-CM | POA: Diagnosis not present

## 2021-05-15 DIAGNOSIS — D692 Other nonthrombocytopenic purpura: Secondary | ICD-10-CM | POA: Diagnosis not present

## 2021-05-15 LAB — CUP PACEART REMOTE DEVICE CHECK
Date Time Interrogation Session: 20230421230416
Implantable Pulse Generator Implant Date: 20220926

## 2021-05-16 DIAGNOSIS — M810 Age-related osteoporosis without current pathological fracture: Secondary | ICD-10-CM | POA: Diagnosis not present

## 2021-05-23 DIAGNOSIS — E876 Hypokalemia: Secondary | ICD-10-CM | POA: Diagnosis not present

## 2021-05-23 DIAGNOSIS — E875 Hyperkalemia: Secondary | ICD-10-CM | POA: Diagnosis not present

## 2021-05-23 DIAGNOSIS — Z7952 Long term (current) use of systemic steroids: Secondary | ICD-10-CM | POA: Diagnosis not present

## 2021-05-23 DIAGNOSIS — Z20822 Contact with and (suspected) exposure to covid-19: Secondary | ICD-10-CM | POA: Diagnosis not present

## 2021-05-23 DIAGNOSIS — Z8679 Personal history of other diseases of the circulatory system: Secondary | ICD-10-CM | POA: Diagnosis not present

## 2021-05-23 DIAGNOSIS — I2510 Atherosclerotic heart disease of native coronary artery without angina pectoris: Secondary | ICD-10-CM | POA: Diagnosis not present

## 2021-05-23 DIAGNOSIS — E063 Autoimmune thyroiditis: Secondary | ICD-10-CM | POA: Diagnosis not present

## 2021-05-23 DIAGNOSIS — E872 Acidosis, unspecified: Secondary | ICD-10-CM | POA: Diagnosis not present

## 2021-05-23 DIAGNOSIS — Z9641 Presence of insulin pump (external) (internal): Secondary | ICD-10-CM | POA: Diagnosis not present

## 2021-05-23 DIAGNOSIS — Z951 Presence of aortocoronary bypass graft: Secondary | ICD-10-CM | POA: Diagnosis not present

## 2021-05-23 DIAGNOSIS — I129 Hypertensive chronic kidney disease with stage 1 through stage 4 chronic kidney disease, or unspecified chronic kidney disease: Secondary | ICD-10-CM | POA: Diagnosis not present

## 2021-05-23 DIAGNOSIS — E109 Type 1 diabetes mellitus without complications: Secondary | ICD-10-CM | POA: Diagnosis not present

## 2021-05-23 DIAGNOSIS — Z7901 Long term (current) use of anticoagulants: Secondary | ICD-10-CM | POA: Diagnosis not present

## 2021-05-23 DIAGNOSIS — E1022 Type 1 diabetes mellitus with diabetic chronic kidney disease: Secondary | ICD-10-CM | POA: Diagnosis not present

## 2021-05-23 DIAGNOSIS — D631 Anemia in chronic kidney disease: Secondary | ICD-10-CM | POA: Diagnosis not present

## 2021-05-23 DIAGNOSIS — Z8673 Personal history of transient ischemic attack (TIA), and cerebral infarction without residual deficits: Secondary | ICD-10-CM | POA: Diagnosis not present

## 2021-05-23 DIAGNOSIS — Z7982 Long term (current) use of aspirin: Secondary | ICD-10-CM | POA: Diagnosis not present

## 2021-05-23 DIAGNOSIS — Z794 Long term (current) use of insulin: Secondary | ICD-10-CM | POA: Diagnosis not present

## 2021-05-23 DIAGNOSIS — Z79899 Other long term (current) drug therapy: Secondary | ICD-10-CM | POA: Diagnosis not present

## 2021-05-23 DIAGNOSIS — E271 Primary adrenocortical insufficiency: Secondary | ICD-10-CM | POA: Diagnosis not present

## 2021-05-23 DIAGNOSIS — N1832 Chronic kidney disease, stage 3b: Secondary | ICD-10-CM | POA: Diagnosis not present

## 2021-05-24 DIAGNOSIS — Z20822 Contact with and (suspected) exposure to covid-19: Secondary | ICD-10-CM | POA: Diagnosis not present

## 2021-05-25 DIAGNOSIS — R051 Acute cough: Secondary | ICD-10-CM | POA: Diagnosis not present

## 2021-05-25 DIAGNOSIS — Z20822 Contact with and (suspected) exposure to covid-19: Secondary | ICD-10-CM | POA: Diagnosis not present

## 2021-05-25 DIAGNOSIS — R059 Cough, unspecified: Secondary | ICD-10-CM | POA: Diagnosis not present

## 2021-05-26 DIAGNOSIS — Z20822 Contact with and (suspected) exposure to covid-19: Secondary | ICD-10-CM | POA: Diagnosis not present

## 2021-05-30 DIAGNOSIS — E1342 Other specified diabetes mellitus with diabetic polyneuropathy: Secondary | ICD-10-CM | POA: Diagnosis not present

## 2021-05-30 DIAGNOSIS — L84 Corns and callosities: Secondary | ICD-10-CM | POA: Diagnosis not present

## 2021-05-30 NOTE — Progress Notes (Signed)
Carelink Summary Report / Loop Recorder 

## 2021-06-01 ENCOUNTER — Ambulatory Visit (INDEPENDENT_AMBULATORY_CARE_PROVIDER_SITE_OTHER): Payer: Medicare Other | Admitting: Cardiology

## 2021-06-01 ENCOUNTER — Encounter: Payer: Self-pay | Admitting: Cardiology

## 2021-06-01 VITALS — BP 138/70 | HR 66 | Ht 62.0 in | Wt 123.4 lb

## 2021-06-01 DIAGNOSIS — E782 Mixed hyperlipidemia: Secondary | ICD-10-CM

## 2021-06-01 DIAGNOSIS — I639 Cerebral infarction, unspecified: Secondary | ICD-10-CM

## 2021-06-01 DIAGNOSIS — I2510 Atherosclerotic heart disease of native coronary artery without angina pectoris: Secondary | ICD-10-CM

## 2021-06-01 DIAGNOSIS — I10 Essential (primary) hypertension: Secondary | ICD-10-CM | POA: Diagnosis not present

## 2021-06-01 MED ORDER — ROSUVASTATIN CALCIUM 40 MG PO TABS: 40.0000 mg | ORAL_TABLET | Freq: Every day | ORAL | 1 refills | Status: DC

## 2021-06-01 NOTE — Progress Notes (Signed)
? ? ? ?Clinical Summary ?[name] is a 71 y.o.female seen today for follow up of the following medical problems.  ?  ?1. CAD ?-  previously followed by [facility]. 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   ?- LVEF March 2011 reported normal LVEF. Echo 04/2011 LVEF 55-60%. ?- 12/2014 Lexiscan without ischemia ?- 08/2016 echo LVEF 60-65%, grade II diastolic dysfunction ?- 04/979 LVEF 60-65%, grade I,  ?  ?  ?-currerntly on plavix per neuro, we have deferred antiplatelet agents to neurology given recurrent CVAs ?  ?2. PAD ?- hx of right common femoral to popliteal bypass in [place] ?- followed by vascular ?  ?  ?05/2020 stents to bilateral common iliac arteries ?  ?3. Hyperlipidemia ?- 02/2015 TC 148 TG 38 HDL 104 LDL 36 ?  ?03/2019 TC 155 TG 63 HDL 87 LDL 55 ?09/2020 TC 154 TG 55 HDL 76 LDL 67 ?Jan 2023 TC 173 TG 54 HDL 91 LDL 71 ?  ?4. IDDM ?- on insulin pump, followed by endocrine ?  ?5. Adrenal insufficiency ?- followed by endocrine ?  ?  ?6. CKD ?- followed at [facility] ?  ?  ?7. Chronic diastolic HF ?- echo 01/9145 LVEF 60-65%, grade II diastolic dysfunction. Mild to moderate RV dysfunction. PASP 23.  ?- no recent issues with edema, actually on florinef for adrenal insufficiency history.  ?  ?- denies any edema.  ?  ?8. CVA ?- admit 09/2020 with CVA ?- was discharged with loop recorder ?- 04/2021 loop recorder check was benign ? ?- she is plavix alone perneuro ? ?9. HTN ?- home bp's <130/60s ?  ?Past Medical History:  ?Diagnosis Date  ? Cancer [place])   ? Squamous Cell  ? Diabetes mellitus without complication ([place])   ? Hyperlipemia   ? Hypertension   ? Hypothyroidism   ? PAD (peripheral artery disease) ([place])   ? Stroke [facility])   ? ? ? ?Allergies  ?Allergen Reactions  ? Bactrim [Sulfamethoxazole-Trimethoprim] Other (See Comments)  ?  Elevates potassium.   ? Tape Other (See Comments)  ?  not allergic but causes sensitivity.  ? Fluorescein Rash  ? Procrit [Epoetin (Alfa)]  Rash  ? ? ? ?Current Outpatient Medications  ?Medication Sig Dispense Refill  ? acetaminophen (TYLENOL) 500 MG tablet Take 500 mg by mouth every 6 (six) hours as needed for mild pain or headache.     ? amLODipine (NORVASC) 5 MG tablet Take 2.5 mg by mouth daily.    ? Ascorbic Acid (VITAMIN C WITH ROSE HIPS) 500 MG tablet Take 500 mg by mouth at bedtime.    ? Calcium Carbonate (CALCIUM 600 PO) Take 600 mg by mouth in the morning and at bedtime.    ? Cholecalciferol (VITAMIN D3) 50 MCG (2000 UT) TABS Take 2,000 Units by mouth daily.    ? clopidogrel (PLAVIX) 75 MG tablet Take 1 tablet (75 mg total) by mouth daily. 90 tablet 3  ? fludrocortisone (FLORINEF) 0.1 MG tablet Take 0.05 mg by mouth daily.    ? HUMALOG 100 UNIT/ML injection Inject into the skin See admin instructions. VIA INSULIN PUMP    ? Insulin Human (INSULIN PUMP) SOLN Inject into the skin. HUMALOG 100 UNIT/ML    ? iron polysaccharides (NIFEREX) 150 MG capsule Take 150 mg by mouth at bedtime.     ? levothyroxine (SYNTHROID) 112 MCG tablet Take 112 mcg by mouth daily.    ? magnesium  chloride (SLOW-MAG) 64 MG TBEC SR tablet Take 1-2 tablets by mouth See admin instructions. TAKE 1 TABLET IN THE MORNING & TAKE 2 TABLETS AT NIGHT.    ? Multiple Vitamin (MULTIVITAMIN WITH MINERALS) TABS tablet Take 1 tablet by mouth daily.    ? Multiple Vitamins-Minerals (PRESERVISION AREDS 2 PO) Take 1 tablet by mouth daily.     ? Omega-3 Fatty Acids (FISH OIL) 1200 MG CAPS Take 1,200 mg by mouth daily.    ? predniSONE (DELTASONE) 5 MG tablet Take 5 mg by mouth daily with breakfast.    ? rosuvastatin (CRESTOR) 40 MG tablet Take 1 tablet (40 mg total) by mouth daily. 90 tablet 1  ? sodium bicarbonate 650 MG tablet Take 650 mg by mouth daily.    ? VELTASSA 8.4 g packet Take 8.4 g by mouth once a week.    ? GLUCAGEN HYPOKIT 1 MG SOLR injection Inject into the muscle.    ? ?No current facility-administered medications for this visit.  ? ? ? ?Past Surgical History:  ?Procedure  Laterality Date  ? ABDOMINAL AORTOGRAM W/LOWER EXTREMITY N/A 06/09/2019  ? Procedure: ABDOMINAL AORTOGRAM W/LOWER EXTREMITY;  Surgeon: Serafina Mitchell, MD;  Location: [place] [facility];  Service: Cardiovascular;  Laterality: N/A;  ? APPENDECTOMY    ? CATARACT EXTRACTION Bilateral   ? CORONARY ARTERY BYPASS GRAFT    ? LOOP RECORDER INSERTION N/A 10/17/2020  ? Procedure: LOOP RECORDER INSERTION;  Surgeon: Deboraha Sprang, MD;  Location: [place] [facility];  Service: Cardiovascular;  Laterality: N/A;  ? PERIPHERAL VASCULAR INTERVENTION Bilateral 06/09/2019  ? Procedure: PERIPHERAL VASCULAR INTERVENTION;  Surgeon: Serafina Mitchell, MD;  Location: [place] [facility];  Service: Cardiovascular;  Laterality: Bilateral;  ? TONSILLECTOMY    ? YAG LASER APPLICATION Right 2/53/6644  ? Procedure: YAG LASER APPLICATION;  Surgeon: Williams Che, MD;  Location: [facility];  Service: Ophthalmology;  Laterality: Right;  ? ? ? ?Allergies  ?Allergen Reactions  ? Bactrim [Sulfamethoxazole-Trimethoprim] Other (See Comments)  ?  Elevates potassium.   ? Tape Other (See Comments)  ?  not allergic but causes sensitivity.  ? Fluorescein Rash  ? Procrit [Epoetin (Alfa)] Rash  ? ? ? ? ?Family History  ?Problem Relation Age of Onset  ? Heart disease Mother   ? Hypertension Mother   ? Hypertension Father   ? ? ? ?Social History ?Kendra Walker reports that she has never smoked. She has never used smokeless tobacco. ?Kendra Walker reports no history of alcohol use. ? ? ?Review of Systems ?CONSTITUTIONAL: No weight loss, fever, chills, weakness or fatigue.  ?HEENT: Eyes: No visual loss, blurred vision, double vision or yellow sclerae.No hearing loss, sneezing, congestion, runny nose or sore throat.  ?SKIN: No rash or itching.  ?CARDIOVASCULAR: per hpi ?RESPIRATORY: No shortness of breath, cough or sputum.  ?GASTROINTESTINAL: No anorexia, nausea, vomiting or diarrhea. No abdominal pain or blood.  ?GENITOURINARY: No burning on urination, no  polyuria ?NEUROLOGICAL: No headache, dizziness, syncope, paralysis, ataxia, numbness or tingling in the extremities. No change in bowel or bladder control.  ?MUSCULOSKELETAL: No muscle, back pain, joint pain or stiffness.  ?LYMPHATICS: No enlarged nodes. No history of splenectomy.  ?PSYCHIATRIC: No history of depression or anxiety.  ?ENDOCRINOLOGIC: No reports of sweating, cold or heat intolerance. No polyuria or polydipsia.  ?. ? ? ?Physical Examination ?Today's Vitals  ? 06/01/21 1520 06/01/21 1602  ?BP: (!) 150/64 138/70  ?Pulse: 66   ?SpO2: 96%   ?Weight: 123  lb 6.4 oz (56 kg)   ?Height: '5\' 2"'$  (1.575 m)   ? ?Body mass index is 22.57 kg/m?. ? ?Gen: resting comfortably, no acute distress ?HEENT: no scleral icterus, pupils equal round and reactive, no palptable cervical adenopathy,  ?CV: RRR, no m/r/g no jvd ?Resp: Clear to auscultation bilaterally ?GI: abdomen is soft, non-tender, non-distended, normal bowel sounds, no hepatosplenomegaly ?MSK: extremities are warm, no edema.  ?Skin: warm, no rash ?Neuro:  no focal deficits ?Psych: appropriate affect ? ? ?Diagnostic Studies ? ?04/2011 Echo ?FINDINGS: ? ?LEFT VENTRICLE ?The left ventricular size is normal. There is normal left ventricular wall  ?thickness. Left ventricular systolic function is normal. LV ejection fraction  ?= 55-60%. Left ventricular filling pattern is indeterminate. The left  ?ventricular wall motion is normal. ?LV WALL MOTION ?- ? ?RIGHT VENTRICLE ?The right ventricle is normal in size and function. The right ventricular  ?systolic function is normal. The right ventricular wall motion is normal. ? ?LEFT ATRIUM ?The left atrium is borderline dilated. ? ?RIGHT ATRIUM ? ?Right atrial size is normal. ?- ?AORTIC VALVE ?The aortic valve is normal in structure and function. The aortic valve opens  ?well. Trace (trivial) aortic regurgitation. ?- ?MITRAL VALVE ?The mitral valve leaflets appear normal. There is no evidence of stenosis,  ?fluttering, or  prolapse. There is mild mitral regurgitation. ?- ?TRICUSPID VALVE ?Structurally normal tricuspid valve. There is mild tricuspid regurgitation. ?- ?PULMONIC VALVE ?The pulmonic valve is normal in structure and function. Trace pulmo

## 2021-06-01 NOTE — Patient Instructions (Signed)
Medication Instructions:  ?Your physician has recommended you make the following change in your medication:  ?Start Crestor 40 mg once a day ?Continue all other medications as directed ? ?Labwork: ?none ? ?Testing/Procedures: ?none ? ?Follow-Up: ?Your physician recommends that you schedule a follow-up appointment in: 6 months ? ?Any Other Special Instructions Will Be Listed Below (If Applicable). ? ?If you need a refill on your cardiac medications before your next appointment, please call your pharmacy. ? ?

## 2021-06-02 ENCOUNTER — Telehealth: Payer: Self-pay | Admitting: Cardiology

## 2021-06-02 NOTE — Telephone Encounter (Signed)
Spoke with Judson Roch at [facility] and advised that atorvastatin was discontinued by [employer] and crestor 40 mg daily ?Verbalized understanding ?

## 2021-06-02 NOTE — Telephone Encounter (Signed)
Pt c/o medication issue: ? ?1. Name of Medication:  ? rosuvastatin (CRESTOR) 40 MG tablet  ? ? ?2. How are you currently taking this medication (dosage and times per day)? Take 1 tablet (40 mg total) by mouth daily. ? ?3. Are you having a reaction (difficulty breathing--STAT)? No ? ?4. What is your medication issue? Pharmacy states that pt is currently on Lipitor 80 mg that is prescribed by another provider. Please advise ? ?

## 2021-06-13 ENCOUNTER — Ambulatory Visit: Payer: Medicare Other | Admitting: Adult Health

## 2021-06-17 LAB — CUP PACEART REMOTE DEVICE CHECK
Date Time Interrogation Session: 20230524231630
Implantable Pulse Generator Implant Date: 20220926

## 2021-06-20 ENCOUNTER — Ambulatory Visit (INDEPENDENT_AMBULATORY_CARE_PROVIDER_SITE_OTHER): Payer: Medicare Other

## 2021-06-20 DIAGNOSIS — I639 Cerebral infarction, unspecified: Secondary | ICD-10-CM

## 2021-06-29 DIAGNOSIS — Z9641 Presence of insulin pump (external) (internal): Secondary | ICD-10-CM | POA: Diagnosis not present

## 2021-06-29 DIAGNOSIS — E063 Autoimmune thyroiditis: Secondary | ICD-10-CM | POA: Diagnosis not present

## 2021-06-29 DIAGNOSIS — I10 Essential (primary) hypertension: Secondary | ICD-10-CM | POA: Diagnosis not present

## 2021-06-29 DIAGNOSIS — Z79890 Hormone replacement therapy (postmenopausal): Secondary | ICD-10-CM | POA: Diagnosis not present

## 2021-06-29 DIAGNOSIS — E038 Other specified hypothyroidism: Secondary | ICD-10-CM | POA: Diagnosis not present

## 2021-06-29 DIAGNOSIS — Z7982 Long term (current) use of aspirin: Secondary | ICD-10-CM | POA: Diagnosis not present

## 2021-06-29 DIAGNOSIS — E271 Primary adrenocortical insufficiency: Secondary | ICD-10-CM | POA: Diagnosis not present

## 2021-06-29 DIAGNOSIS — Z79899 Other long term (current) drug therapy: Secondary | ICD-10-CM | POA: Diagnosis not present

## 2021-06-29 DIAGNOSIS — E1065 Type 1 diabetes mellitus with hyperglycemia: Secondary | ICD-10-CM | POA: Diagnosis not present

## 2021-07-05 NOTE — Progress Notes (Signed)
Carelink Summary Report / Loop Recorder 

## 2021-07-13 ENCOUNTER — Encounter (INDEPENDENT_AMBULATORY_CARE_PROVIDER_SITE_OTHER): Payer: Medicare Other | Admitting: Ophthalmology

## 2021-07-13 ENCOUNTER — Encounter (INDEPENDENT_AMBULATORY_CARE_PROVIDER_SITE_OTHER): Payer: Self-pay | Admitting: Ophthalmology

## 2021-07-13 ENCOUNTER — Ambulatory Visit (INDEPENDENT_AMBULATORY_CARE_PROVIDER_SITE_OTHER): Payer: Medicare Other | Admitting: Ophthalmology

## 2021-07-13 DIAGNOSIS — H353212 Exudative age-related macular degeneration, right eye, with inactive choroidal neovascularization: Secondary | ICD-10-CM

## 2021-07-13 DIAGNOSIS — H353133 Nonexudative age-related macular degeneration, bilateral, advanced atrophic without subfoveal involvement: Secondary | ICD-10-CM | POA: Diagnosis not present

## 2021-07-13 DIAGNOSIS — I639 Cerebral infarction, unspecified: Secondary | ICD-10-CM

## 2021-07-13 DIAGNOSIS — E103553 Type 1 diabetes mellitus with stable proliferative diabetic retinopathy, bilateral: Secondary | ICD-10-CM

## 2021-07-13 NOTE — Assessment & Plan Note (Signed)
Quiescent PDR OU ? ?The nature of regressed proliferative diabetic retinopathy was discussed with the patient. The patient was advised to maintain good glucose, blood pressure, monitor kidney function and serum lipid control as advised by personal physician. Rare risk for reactivation of progression exist with untreated severe anemia, untreated renal failure, untreated heart failure, and smoking. ?Complete avoidance of smoking was recommended. The chance of recurrent proliferative diabetic retinopathy was discussed as well as the chance of vitreous hemorrhage for which further treatments may be necessary.   Explained to the patient that the quiescent  proliferative diabetic retinopathy disease is unlikely to ever worsen.  Worsening factors would include however severe anemia, hypertension out-of-control or impending renal failure. ?

## 2021-07-13 NOTE — Assessment & Plan Note (Signed)
No active CNVM OD

## 2021-07-18 ENCOUNTER — Ambulatory Visit (INDEPENDENT_AMBULATORY_CARE_PROVIDER_SITE_OTHER): Payer: Medicare Other | Admitting: Adult Health

## 2021-07-18 ENCOUNTER — Encounter: Payer: Self-pay | Admitting: Adult Health

## 2021-07-18 VITALS — BP 140/61 | HR 74 | Ht 62.0 in | Wt 122.0 lb

## 2021-07-18 DIAGNOSIS — I69328 Other speech and language deficits following cerebral infarction: Secondary | ICD-10-CM | POA: Diagnosis not present

## 2021-07-18 DIAGNOSIS — R29898 Other symptoms and signs involving the musculoskeletal system: Secondary | ICD-10-CM

## 2021-07-18 DIAGNOSIS — I639 Cerebral infarction, unspecified: Secondary | ICD-10-CM

## 2021-07-18 DIAGNOSIS — Z9189 Other specified personal risk factors, not elsewhere classified: Secondary | ICD-10-CM

## 2021-07-24 ENCOUNTER — Ambulatory Visit (INDEPENDENT_AMBULATORY_CARE_PROVIDER_SITE_OTHER): Payer: Medicare Other

## 2021-07-24 DIAGNOSIS — I639 Cerebral infarction, unspecified: Secondary | ICD-10-CM

## 2021-07-25 LAB — CUP PACEART REMOTE DEVICE CHECK
Date Time Interrogation Session: 20230702230621
Implantable Pulse Generator Implant Date: 20220926

## 2021-07-26 DIAGNOSIS — N184 Chronic kidney disease, stage 4 (severe): Secondary | ICD-10-CM | POA: Diagnosis not present

## 2021-07-26 DIAGNOSIS — I10 Essential (primary) hypertension: Secondary | ICD-10-CM | POA: Diagnosis not present

## 2021-07-26 DIAGNOSIS — E1021 Type 1 diabetes mellitus with diabetic nephropathy: Secondary | ICD-10-CM | POA: Diagnosis not present

## 2021-07-26 DIAGNOSIS — R5383 Other fatigue: Secondary | ICD-10-CM | POA: Diagnosis not present

## 2021-07-26 DIAGNOSIS — E039 Hypothyroidism, unspecified: Secondary | ICD-10-CM | POA: Diagnosis not present

## 2021-07-27 ENCOUNTER — Encounter (HOSPITAL_COMMUNITY): Payer: Self-pay | Admitting: Speech Pathology

## 2021-07-27 ENCOUNTER — Ambulatory Visit (HOSPITAL_COMMUNITY): Payer: Medicare Other | Attending: Adult Health | Admitting: Speech Pathology

## 2021-07-27 DIAGNOSIS — R41841 Cognitive communication deficit: Secondary | ICD-10-CM | POA: Insufficient documentation

## 2021-07-27 NOTE — Therapy (Signed)
OUTPATIENT SPEECH LANGUAGE PATHOLOGY EVALUATION   Patient Name: Kendra Walker MRN: [id number] DOB:1950-09-17, 71 y.o., female Today's Date: 07/27/2021  PCP: PCP: Curlene Labrum, MD    REFERRING PROVIDER: Frann Rider, AGNP-BC    End of Session - 07/27/21 1100     Visit Number 1    Number of Visits 9    Date for SLP Re-Evaluation 09/07/21    Authorization Type Medicare    SLP Start Time 1030    SLP Stop Time  1120    SLP Time Calculation (min) 50 min    Activity Tolerance Patient tolerated treatment well             Past Medical History:  Diagnosis Date   Cancer ([place])    Squamous Cell   Diabetes mellitus without complication ([place])    Hyperlipemia    Hypertension    Hypothyroidism    PAD (peripheral artery disease) ([place])    Stroke [facility])    Past Surgical History:  Procedure Laterality Date   ABDOMINAL AORTOGRAM W/LOWER EXTREMITY N/A 06/09/2019   Procedure: ABDOMINAL AORTOGRAM W/LOWER EXTREMITY;  Surgeon: Serafina Mitchell, MD;  Location: [place] [facility];  Service: Cardiovascular;  Laterality: N/A;   APPENDECTOMY     CATARACT EXTRACTION Bilateral    CORONARY ARTERY BYPASS GRAFT     LOOP RECORDER INSERTION N/A 10/17/2020   Procedure: LOOP RECORDER INSERTION;  Surgeon: Deboraha Sprang, MD;  Location: [place] [facility];  Service: Cardiovascular;  Laterality: N/A;   PERIPHERAL VASCULAR INTERVENTION Bilateral 06/09/2019   Procedure: PERIPHERAL VASCULAR INTERVENTION;  Surgeon: Serafina Mitchell, MD;  Location: 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 ([place]) 10/13/2019    Advanced nonexudative age-related macular degeneration of both eyes without subfoveal involvement 10/13/2019   Bilateral epiretinal membrane 10/13/2019   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 choroidal neovascularization ([place]) 10/13/2019   Groin hematoma 06/09/2019   Colon cancer screening 09/16/2017   Severe diabetic hypoglycemia ([place]) 54/00/8676   Metabolic acidosis 19/50/9326   Elevated troponin 03/07/2016   Pneumonia 03/07/2016   Septic shock ([place]) 03/07/2016   CAD (coronary artery disease) 11/11/2013   HTN (hypertension) 11/11/2013   PAD (peripheral artery disease) ([place]) 11/11/2013   Hyperlipidemia 11/11/2013   Pyuria 05/12/2013   Hypomagnesemia 03/03/2012   Hypothyroidism 01/28/2012   Anemia 09/27/2011   Adrenal insufficiency (Addison's disease) ([place]) 08/31/2011   Wound, open, leg 07/19/2011   Chronic kidney disease, stage III (moderate) ([place]) 05/14/2011   Diabetic retinopathy ([place]) 11/27/2010   History of two vessel coronary artery bypass graft 11/27/2010   Hyperkalemia 11/27/2010   Iron deficiency 11/27/2010    ONSET DATE: 07/18/2021    REFERRING DIAG: I63.9 (ICD-10-CM) - Recurrent strokes (HCC) Z12.458 (ICD-10-CM) - 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 year old female with acute/subacute infarct to the left  parietal region 03/06/2021.  Hx of  Small left cortical infarct on 04/30/1446 likely embolic secondary to unclear source s/p ILR. Vascular risk factors include prior stroke on imaging, HTN, HLD, DM type 1 and PAD. Residual deficit: Mild RUE weakness and word finding difficulty mild memory loss (?very mild after stroke in 09/2020 and possibly worsened after 02/2021 stroke).   Referral placed to [facility] SLP   PAIN:  Are you having pain? Yes, in neck, but does not interfere with SLP sessions   FALLS: Has patient fallen in last 6 months?  No  LIVING ENVIRONMENT: Lives with: lives with their family Lives in: House/apartment  PLOF:  Level of assistance: Independent with ADLs Employment: Retired   PATIENT GOALS Improve cognition  OBJECTIVE:   DIAGNOSTIC FINDINGS:  [place] Examination Orientation  3/3  Numeric Problem Solving  0/3  Memory  2/5  Attention 0/2  Thought Organization 2/3  Clock Drawing 0/4  Visuospatial Skills               2/2  Short Story Recall  8/8  Total  17/30     Scoring  High School Education  Less than High School Education   Normal  27-30 25-30  Mild Neurocognitive Disorder 21-26 20-24  Dementia  1-20 1-19     COGNITION: Overall cognitive status: Impaired Areas of impairment:  Attention: Impaired: Sustained Memory: Impaired: Immediate Working Industrial/product designer term Programme researcher, broadcasting/film/video function: Impaired: Problem solving, Planning, Error awareness, Self-correction, and Slow processing Functional deficits: Husband now helps for most tasks (medication management, finances, and cooking)  AUDITORY COMPREHENSION: Overall auditory comprehension: Impaired: complex YES/NO questions: Appears intact Following directions: Impaired: complex Conversation: Complex Interfering components: attention and working Contractor: extra processing time, repetition/stressing words, and written cues  READING COMPREHENSION: Intact  EXPRESSION: verbal  VERBAL EXPRESSION: Level of generative/spontaneous verbalization: conversation Automatic speech: name: intact, social response: intact, and month of year: intact  Repetition: Appears intact Naming: Responsive: 76-100%, Confrontation: 76-100%, and Divergent: 76-100% Pragmatics: Appears intact Comments: Pt reports inability to recall specific names of previously known items (craftsman  house) Interfering components: attention Effective technique: phonemic cues Non-verbal means of communication: N/A  WRITTEN EXPRESSION: Dominant hand: right  Written expression: Not tested  MOTOR SPEECH: Overall motor speech: Appears intact Level of impairment:  N/A Respiration: diaphragmatic/abdominal breathing Phonation: normal Resonance: WFL Articulation: Appears intact Intelligibility: Intelligible Motor planning: Appears intact Motor speech errors:  N/A Interfering components:  N/A   ORAL MOTOR EXAMINATION Overall status: WFL Comments: N/A   STANDARDIZED ASSESSMENTS: [place] NAMING TEST: Short form only; 15/15    TODAY'S TREATMENT:  Evaluation only this date   PATIENT EDUCATION: Education details: Plan for SLP therapy once per week for 4 weeks Person educated: Patient and Spouse Education method: Explanation Education comprehension: verbalized understanding     GOALS: Goals reviewed with patient? Yes  SHORT TERM GOALS: Target date: 09/07/2021   Pt will implement memory strategies in functional therapy activities with 90% acc with mi/mod cues Baseline: 40% Goal status: INITIAL  2.  Pt will utilize external memory strategies in home environment by recording 3 items daily in planner, notebook, daily memory writing task daily for 5/7 days Baseline: Not using a system at this time Goal status: INITIAL  3.  Pt will complete moderate-level thought organization and planning activities with 90% acc and min assist. Baseline: 75% Goal status: INITIAL  4.  Pt will implement word-finding strategies with 90% accuracy when unable to verbalize desired word in conversation/functional tasks with  min assist.    Baseline: mi/mod assist Goal status: INITIAL   LONG TERM GOALS: Same as short term goals  ASSESSMENT:  CLINICAL IMPRESSION: Patient is a 71 y.o. female who was seen today for a cognitive linguistic evaluation by Frann Rider, NP due to cognitive  linguistic changes following previous strokes. Pt scored a 17/30 on the [facility] with deficits in mental calculations, attention, divergent naming (11 in 60 seconds), immediate and delayed recall, visuospatial skills, and executive functioning. She recalled 2/5 words after a few minute delay, wan unable to repeat 3 digits in reverse, and wrote the numbers on the clock backwards (going 12, 11, 10 etc) and unable to identify what "ten minutes to eleven o'clock" would be digitally. She showed relative strengths in orientation, verbal expression, and auditory comprehension of paragraph length information (8/8). She completed a moderately complex planning task (scheduling a busy day with specific parameters) with ~80% acc but with disregard for appropriate times for each task. She lives at home with her husband and her mother and she reports feeling frustrated when she is unable to remember the names of specific things (she gives the example that she enjoys [employer]GT[employer] and cannot recall the type of house they frequently show). She does not currently use written cues to assist her with memory deficits and her husband provides a lot of support.   OBJECTIVE IMPAIRMENTS include attention, memory, and executive functioning. These impairments are limiting patient from managing medications, managing appointments, managing finances, and household responsibilities. Factors affecting potential to achieve goals and functional outcome are ability to learn/carryover information and severity of impairments. Patient will benefit from skilled SLP services to address above impairments and improve overall function.  REHAB POTENTIAL: Good  PLAN: SLP FREQUENCY: 1x/week  SLP DURATION: 4 weeks  PLANNED INTERVENTIONS: Cueing hierachy, Cognitive reorganization, Internal/external aids, Functional tasks, Multimodal communication approach, SLP instruction and feedback, Compensatory strategies, and Patient/family education    Thank  you,  Genene Churn, [place]  [place], [place] 07/27/2021, 11:14 AM

## 2021-08-01 ENCOUNTER — Ambulatory Visit (HOSPITAL_COMMUNITY): Payer: Medicare Other | Admitting: Speech Pathology

## 2021-08-02 DIAGNOSIS — I10 Essential (primary) hypertension: Secondary | ICD-10-CM | POA: Diagnosis not present

## 2021-08-02 DIAGNOSIS — E039 Hypothyroidism, unspecified: Secondary | ICD-10-CM | POA: Diagnosis not present

## 2021-08-02 DIAGNOSIS — E7849 Other hyperlipidemia: Secondary | ICD-10-CM | POA: Diagnosis not present

## 2021-08-02 DIAGNOSIS — Z6821 Body mass index (BMI) 21.0-21.9, adult: Secondary | ICD-10-CM | POA: Diagnosis not present

## 2021-08-02 DIAGNOSIS — E875 Hyperkalemia: Secondary | ICD-10-CM | POA: Diagnosis not present

## 2021-08-02 DIAGNOSIS — E1021 Type 1 diabetes mellitus with diabetic nephropathy: Secondary | ICD-10-CM | POA: Diagnosis not present

## 2021-08-02 DIAGNOSIS — R5383 Other fatigue: Secondary | ICD-10-CM | POA: Diagnosis not present

## 2021-08-02 DIAGNOSIS — J8410 Pulmonary fibrosis, unspecified: Secondary | ICD-10-CM | POA: Diagnosis not present

## 2021-08-02 DIAGNOSIS — I639 Cerebral infarction, unspecified: Secondary | ICD-10-CM | POA: Diagnosis not present

## 2021-08-02 DIAGNOSIS — N184 Chronic kidney disease, stage 4 (severe): Secondary | ICD-10-CM | POA: Diagnosis not present

## 2021-08-02 DIAGNOSIS — E559 Vitamin D deficiency, unspecified: Secondary | ICD-10-CM | POA: Diagnosis not present

## 2021-08-08 ENCOUNTER — Ambulatory Visit (HOSPITAL_COMMUNITY): Payer: Medicare Other | Admitting: Speech Pathology

## 2021-08-08 ENCOUNTER — Encounter (HOSPITAL_COMMUNITY): Payer: Self-pay | Admitting: Speech Pathology

## 2021-08-08 DIAGNOSIS — Z794 Long term (current) use of insulin: Secondary | ICD-10-CM | POA: Diagnosis not present

## 2021-08-08 DIAGNOSIS — R41841 Cognitive communication deficit: Secondary | ICD-10-CM

## 2021-08-08 DIAGNOSIS — E1142 Type 2 diabetes mellitus with diabetic polyneuropathy: Secondary | ICD-10-CM | POA: Diagnosis not present

## 2021-08-08 DIAGNOSIS — L84 Corns and callosities: Secondary | ICD-10-CM | POA: Diagnosis not present

## 2021-08-08 DIAGNOSIS — L603 Nail dystrophy: Secondary | ICD-10-CM | POA: Diagnosis not present

## 2021-08-08 NOTE — Therapy (Signed)
OUTPATIENT SPEECH LANGUAGE PATHOLOGY TREATMENT NOTE   Patient Name: CASHAE WEICH MRN: [id number] DOB:06/18/1950, 71 y.o., female Today's Date: 08/08/2021  PCP: PCP: Curlene Labrum, MD    REFERRING PROVIDER: Frann Rider, AGNP-BC   END OF SESSION:   End of Session - 08/08/21 1524     Visit Number 2    Number of Visits 9    Date for SLP Re-Evaluation 09/07/21    Authorization Type Medicare    SLP Start Time 1519    SLP Stop Time  1604    SLP Time Calculation (min) 45 min    Activity Tolerance Patient tolerated treatment well             Past Medical History:  Diagnosis Date   Cancer ([place])    Squamous Cell   Diabetes mellitus without complication ([place])    Hyperlipemia    Hypertension    Hypothyroidism    PAD (peripheral artery disease) ([place])    Stroke [facility])    Past Surgical History:  Procedure Laterality Date   ABDOMINAL AORTOGRAM W/LOWER EXTREMITY N/A 06/09/2019   Procedure: ABDOMINAL AORTOGRAM W/LOWER EXTREMITY;  Surgeon: Serafina Mitchell, MD;  Location: [place] [facility];  Service: Cardiovascular;  Laterality: N/A;   APPENDECTOMY     CATARACT EXTRACTION Bilateral    CORONARY ARTERY BYPASS GRAFT     LOOP RECORDER INSERTION N/A 10/17/2020   Procedure: LOOP RECORDER INSERTION;  Surgeon: Deboraha Sprang, MD;  Location: [place] [facility];  Service: Cardiovascular;  Laterality: N/A;   PERIPHERAL VASCULAR INTERVENTION Bilateral 06/09/2019   Procedure: PERIPHERAL VASCULAR INTERVENTION;  Surgeon: Serafina Mitchell, MD;  Location: [place] [facility];  Service: Cardiovascular;  Laterality: Bilateral;   TONSILLECTOMY     YAG LASER APPLICATION Right 07/26/8887   Procedure: YAG LASER APPLICATION;  Surgeon: Williams Che, MD;  Location[facility];  Service: Ophthalmology;  Laterality: Right;   Patient Active Problem List   Diagnosis Date Noted   Acute stroke due to ischemia ([place]) 10/14/2020   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  ([place]) 10/13/2019   Advanced nonexudative age-related macular degeneration of both eyes without subfoveal involvement 10/13/2019   Bilateral epiretinal membrane 10/13/2019   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 choroidal neovascularization ([place]) 10/13/2019   Groin hematoma 06/09/2019   Colon cancer screening 09/16/2017   Severe diabetic hypoglycemia ([place]) 16/94/5038   Metabolic acidosis 88/28/0034   Elevated troponin 03/07/2016   Pneumonia 03/07/2016   Septic shock ([place]) 03/07/2016   CAD (coronary artery disease) 11/11/2013   HTN (hypertension) 11/11/2013   PAD (peripheral artery disease) ([place]) 11/11/2013   Hyperlipidemia 11/11/2013   Pyuria 05/12/2013   Hypomagnesemia 03/03/2012   Hypothyroidism 01/28/2012   Anemia 09/27/2011   Adrenal insufficiency (Addison's disease) ([place]) 08/31/2011   Wound, open, leg 07/19/2011   Chronic kidney disease, stage III (moderate) ([place]) 05/14/2011   Diabetic retinopathy ([place]) 11/27/2010   History of two vessel coronary artery bypass graft 11/27/2010   Hyperkalemia 11/27/2010   Iron deficiency 11/27/2010   PERTINENT HISTORY: FLORINE SPRENKLE is a 71 y.o. year old female with acute/subacute infarct to the left parietal region 03/06/2021.  Hx of  Small left cortical infarct on 10/08/9148 likely embolic secondary to unclear source s/p ILR. Vascular risk factors include prior stroke on imaging, HTN, HLD, DM type 1 and PAD. Residual deficit: Mild RUE weakness and word finding difficulty mild memory loss (?very mild after stroke in  09/2020 and possibly worsened after 02/2021 stroke).  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 No    GOALS: Goals reviewed with patient? Yes   SHORT TERM GOALS: Target date: 09/07/2021   Pt will implement memory strategies in functional therapy activities with 90% acc with mi/mod cues Baseline: 40% Goal status: ONGOING   2.  Pt will utilize external memory strategies in home environment by recording 3 items daily in planner, notebook, daily memory writing task daily for 5/7 days Baseline: Not using a system at this time Goal status: ONGOING   3.  Pt will complete moderate-level thought organization and planning activities with 90% acc and min assist. Baseline: 75% Goal status: ONGOING   4.  Pt will implement word-finding strategies with 90% accuracy when unable to verbalize desired word in conversation/functional tasks with min assist.    Baseline: mi/mod assist Goal status: ONGOING     LONG TERM GOALS: Same as short term goals  ASSESSMENT:   CLINICAL IMPRESSION: Patient is a 71 y.o. female who was seen today for a cognitive linguistic evaluation by Frann Rider, NP due to cognitive linguistic changes following previous strokes. Pt scored a 17/30 on the [facility] with deficits in mental calculations, attention, divergent naming (11 in 60 seconds), immediate and delayed recall, visuospatial skills, and executive functioning. She recalled 2/5 words after a few minute delay, wan unable to repeat 3 digits in reverse, and wrote the numbers on the clock backwards (going 12, 11, 10 etc) and unable to identify what "ten minutes to eleven o'clock" would be digitally. She showed relative strengths in orientation, verbal expression, and auditory comprehension of paragraph length information (8/8). She completed a moderately complex planning task (scheduling a busy day with specific parameters) with ~80% acc but with disregard for appropriate times for each task. She lives at home with her husband and her mother and she reports feeling frustrated when she is unable to  remember the names of specific things (she gives the example that she enjoys [employer] and cannot recall the type of house they frequently show). She does not currently use written cues to assist her with memory deficits and her husband provides a lot of support.    OBJECTIVE IMPAIRMENTS include attention, memory, and executive functioning. These impairments are limiting patient from managing medications, managing appointments, managing finances, and household responsibilities. Factors affecting potential to achieve goals and functional outcome are ability to learn/carryover information and severity of impairments. Patient will benefit from skilled SLP services to address above impairments and improve overall function.   REHAB POTENTIAL: Good   PLAN: SLP FREQUENCY: 1x/week   SLP DURATION: 3 weeks   PLANNED INTERVENTIONS: Cueing hierachy, Cognitive reorganization, Internal/external aids, Functional tasks, Multimodal communication approach, SLP instruction and feedback, Compensatory strategies, and Patient/family education  OBJECTIVE:   TODAY'S TREATMENT: Pt was accompanied to therapy by her husband. She indicates that she remembers the word "craftsman" now because she repeated it over and over again. SLP introduced memory strategies and reviewed with Pt. She then was asked to use specific strategies (association, write it down, visualization, and story method) to recall 4 words.  She created a story using  the words, however it was very lengthy and she missed 2/4 words with recall initially. SLP provided cues to condense the story and then visualize it. She recalled 4/4 words after 15 minute delay. She did not exhibit word finding deficits in conversation this date. She tackled the clock drawing task again in session, but was cued to verbally mediate the task first and look at the clock on the wall before initiating writing. She required cues to place the 12, 3, 6, and 9 numbers first, but did place the  hands correctly. Will complete planning exercise next session and encourage use of verbal mediation strategies to complete. Continue plan of care.    Thank you,  Genene Churn, [place]  [place], [place] 08/08/2021, 3:54 PM

## 2021-08-17 ENCOUNTER — Ambulatory Visit (HOSPITAL_COMMUNITY): Payer: Medicare Other | Admitting: Speech Pathology

## 2021-08-17 ENCOUNTER — Encounter (HOSPITAL_COMMUNITY): Payer: Self-pay | Admitting: Speech Pathology

## 2021-08-17 DIAGNOSIS — R41841 Cognitive communication deficit: Secondary | ICD-10-CM | POA: Diagnosis not present

## 2021-08-17 NOTE — Therapy (Signed)
OUTPATIENT SPEECH LANGUAGE PATHOLOGY TREATMENT NOTE   Patient Name: Kendra Walker MRN: [id number] DOB:1950-11-26, 71 y.o., female Today's Date: 08/17/2021  PCP: PCP: Curlene Labrum, MD    REFERRING PROVIDER: Frann Rider, AGNP-BC   END OF SESSION:   End of Session - 08/17/21 0918     Visit Number 3    Number of Visits 9    Date for SLP Re-Evaluation 09/07/21    Authorization Type Medicare    SLP Start Time 0909    SLP Stop Time  0954    SLP Time Calculation (min) 45 min    Activity Tolerance Patient tolerated treatment well             Past Medical History:  Diagnosis Date   Cancer ([place])    Squamous Cell   Diabetes mellitus without complication ([place])    Hyperlipemia    Hypertension    Hypothyroidism    PAD (peripheral artery disease) ([place])    Stroke [facility])    Past Surgical History:  Procedure Laterality Date   ABDOMINAL AORTOGRAM W/LOWER EXTREMITY N/A 06/09/2019   Procedure: ABDOMINAL AORTOGRAM W/LOWER EXTREMITY;  Surgeon: Serafina Mitchell, MD;  Location: [place] [facility];  Service: Cardiovascular;  Laterality: N/A;   APPENDECTOMY     CATARACT EXTRACTION Bilateral    CORONARY ARTERY BYPASS GRAFT     LOOP RECORDER INSERTION N/A 10/17/2020   Procedure: LOOP RECORDER INSERTION;  Surgeon: Deboraha Sprang, MD;  Location: [place] [facility];  Service: Cardiovascular;  Laterality: N/A;   PERIPHERAL VASCULAR INTERVENTION Bilateral 06/09/2019   Procedure: PERIPHERAL VASCULAR INTERVENTION;  Surgeon: Serafina Mitchell, MD;  Location: [place] [facility];  Service: Cardiovascular;  Laterality: Bilateral;   TONSILLECTOMY     YAG LASER APPLICATION Right 8/36/6294   Procedure: YAG LASER APPLICATION;  Surgeon: Williams Che, MD;  Location[facility];  Service: Ophthalmology;  Laterality: Right;   Patient Active Problem List   Diagnosis Date Noted   Acute stroke due to ischemia ([place]) 10/14/2020   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  ([place]) 10/13/2019   Advanced nonexudative age-related macular degeneration of both eyes without subfoveal involvement 10/13/2019   Bilateral epiretinal membrane 10/13/2019   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 choroidal neovascularization ([place]) 10/13/2019   Groin hematoma 06/09/2019   Colon cancer screening 09/16/2017   Severe diabetic hypoglycemia ([place]) 76/54/6503   Metabolic acidosis 54/65/6812   Elevated troponin 03/07/2016   Pneumonia 03/07/2016   Septic shock ([place]) 03/07/2016   CAD (coronary artery disease) 11/11/2013   HTN (hypertension) 11/11/2013   PAD (peripheral artery disease) ([place]) 11/11/2013   Hyperlipidemia 11/11/2013   Pyuria 05/12/2013   Hypomagnesemia 03/03/2012   Hypothyroidism 01/28/2012   Anemia 09/27/2011   Adrenal insufficiency (Addison's disease) ([place]) 08/31/2011   Wound, open, leg 07/19/2011   Chronic kidney disease, stage III (moderate) ([place]) 05/14/2011   Diabetic retinopathy ([place]) 11/27/2010   History of two vessel coronary artery bypass graft 11/27/2010   Hyperkalemia 11/27/2010   Iron deficiency 11/27/2010   PERTINENT HISTORY: Kendra Walker is a 71 y.o. year old female with acute/subacute infarct to the left parietal region 03/06/2021.  Hx of  Small left cortical infarct on 7/51/7001 likely embolic secondary to unclear source s/p ILR. Vascular risk factors include prior stroke on imaging, HTN, HLD, DM type 1 and PAD. Residual deficit: Mild RUE weakness and word finding difficulty mild memory loss (?very mild after stroke in  09/2020 and possibly worsened after 02/2021 stroke).  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 No    GOALS: Goals reviewed with patient? Yes   SHORT TERM GOALS: Target date: 09/07/2021   Pt will implement memory strategies in functional therapy activities with 90% acc with mi/mod cues Baseline: 40% Goal status: ONGOING   2.  Pt will utilize external memory strategies in home environment by recording 3 items daily in planner, notebook, daily memory writing task daily for 5/7 days Baseline: Not using a system at this time Goal status: ONGOING   3.  Pt will complete moderate-level thought organization and planning activities with 90% acc and min assist. Baseline: 75% Goal status: ONGOING   4.  Pt will implement word-finding strategies with 90% accuracy when unable to verbalize desired word in conversation/functional tasks with min assist.    Baseline: mi/mod assist Goal status: ONGOING     LONG TERM GOALS: Same as short term goals  ASSESSMENT:   CLINICAL IMPRESSION: Patient is a 71 y.o. female who was seen today for a cognitive linguistic evaluation by Frann Rider, NP due to cognitive linguistic changes following previous strokes. Pt scored a 17/30 on the [facility] with deficits in mental calculations, attention, divergent naming (11 in 60 seconds), immediate and delayed recall, visuospatial skills, and executive functioning. She recalled 2/5 words after a few minute delay, wan unable to repeat 3 digits in reverse, and wrote the numbers on the clock backwards (going 12, 11, 10 etc) and unable to identify what "ten minutes to eleven o'clock" would be digitally. She showed relative strengths in orientation, verbal expression, and auditory comprehension of paragraph length information (8/8). She completed a moderately complex planning task (scheduling a busy day with specific parameters) with ~80% acc but with disregard for appropriate times for each task. She lives at home with her husband and her mother and she reports feeling frustrated when she is unable to remember the  names of specific things (she gives the example that she enjoys [employer] and cannot recall the type of house they frequently show). She does not currently use written cues to assist her with memory deficits and her husband provides a lot of support.    OBJECTIVE IMPAIRMENTS include attention, memory, and executive functioning. These impairments are limiting patient from managing medications, managing appointments, managing finances, and household responsibilities. Factors affecting potential to achieve goals and functional outcome are ability to learn/carryover information and severity of impairments. Patient will benefit from skilled SLP services to address above impairments and improve overall function.   REHAB POTENTIAL: Good   PLAN: SLP FREQUENCY: 1x/week   SLP DURATION: 3 weeks   PLANNED INTERVENTIONS: Cueing hierachy, Cognitive reorganization, Internal/external aids, Functional tasks, Multimodal communication approach, SLP instruction and feedback, Compensatory strategies, and Patient/family education  OBJECTIVE:   TODAY'S TREATMENT: Pt was accompanied to therapy by her husband. She purchased a Public affairs consultant, but has not yet used to record memorable information in. Instead, she says she has been repeating important information over and over again to herself to aid in recall.  She indicates that everything is going well at home in regards to cognitive linguistic skills. She keeps track  of all appointments for herself and her mother. SLP provided skilled intervention strategies through use of stop and think cues, cues for self correction, modifications, and scaffolding of memory tasks. She completed a basic level planning task with 86% acc and required extra time to complete the task. She was encouraged to double check her work (she was missing a time slot). She completed moderate level word description task (Taboo game) with initially needing moderate cues which faded to min cues with repetition  of task. She completed an auditory recall task with initial cues to take notes and to allow for repetition with 90% acc and improved to 100% acc with continued trials. 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 Continue plan of care.    Thank you,  Genene Churn, [place]  [place], [place] 08/17/2021, 9:20 AM

## 2021-08-17 NOTE — Progress Notes (Signed)
Carelink Summary Report / Loop Recorder 

## 2021-08-21 DIAGNOSIS — H0102B Squamous blepharitis left eye, upper and lower eyelids: Secondary | ICD-10-CM | POA: Diagnosis not present

## 2021-08-21 DIAGNOSIS — H0102A Squamous blepharitis right eye, upper and lower eyelids: Secondary | ICD-10-CM | POA: Diagnosis not present

## 2021-08-21 DIAGNOSIS — E103593 Type 1 diabetes mellitus with proliferative diabetic retinopathy without macular edema, bilateral: Secondary | ICD-10-CM | POA: Diagnosis not present

## 2021-08-21 DIAGNOSIS — H04123 Dry eye syndrome of bilateral lacrimal glands: Secondary | ICD-10-CM | POA: Diagnosis not present

## 2021-08-22 ENCOUNTER — Ambulatory Visit (HOSPITAL_COMMUNITY): Payer: Medicare Other | Attending: Adult Health | Admitting: Speech Pathology

## 2021-08-22 ENCOUNTER — Encounter (HOSPITAL_COMMUNITY): Payer: Self-pay | Admitting: Speech Pathology

## 2021-08-22 DIAGNOSIS — R41841 Cognitive communication deficit: Secondary | ICD-10-CM | POA: Diagnosis not present

## 2021-08-22 NOTE — Therapy (Signed)
OUTPATIENT SPEECH LANGUAGE PATHOLOGY TREATMENT NOTE   Patient Name: Kendra Walker MRN: [id number] DOB:27-Mar-1950, 71 y.o., female Today's Date: 08/22/2021  PCP: PCP: Curlene Labrum, MD    REFERRING PROVIDER: Frann Rider, AGNP-BC   END OF SESSION:   End of Session - 08/22/21 1426     Visit Number 4    Number of Visits 9    Date for SLP Re-Evaluation 09/07/21    Authorization Type Medicare    SLP Start Time 82    SLP Stop Time  0175    SLP Time Calculation (min) 45 min    Activity Tolerance Patient tolerated treatment well             Past Medical History:  Diagnosis Date   Cancer ([place])    Squamous Cell   Diabetes mellitus without complication ([place])    Hyperlipemia    Hypertension    Hypothyroidism    PAD (peripheral artery disease) ([place])    Stroke [facility])    Past Surgical History:  Procedure Laterality Date   ABDOMINAL AORTOGRAM W/LOWER EXTREMITY N/A 06/09/2019   Procedure: ABDOMINAL AORTOGRAM W/LOWER EXTREMITY;  Surgeon: Serafina Mitchell, MD;  Location: [place] [facility];  Service: Cardiovascular;  Laterality: N/A;   APPENDECTOMY     CATARACT EXTRACTION Bilateral    CORONARY ARTERY BYPASS GRAFT     LOOP RECORDER INSERTION N/A 10/17/2020   Procedure: LOOP RECORDER INSERTION;  Surgeon: Deboraha Sprang, MD;  Location: [place] [facility];  Service: Cardiovascular;  Laterality: N/A;   PERIPHERAL VASCULAR INTERVENTION Bilateral 06/09/2019   Procedure: PERIPHERAL VASCULAR INTERVENTION;  Surgeon: Serafina Mitchell, MD;  Location: [place] [facility];  Service: Cardiovascular;  Laterality: Bilateral;   TONSILLECTOMY     YAG LASER APPLICATION Right 01/23/5850   Procedure: YAG LASER APPLICATION;  Surgeon: Williams Che, MD;  Location: [facility];  Service: Ophthalmology;  Laterality: Right;   Patient Active Problem List   Diagnosis Date Noted   Acute stroke due to ischemia ([place]) 10/14/2020   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  ([place]) 10/13/2019   Advanced nonexudative age-related macular degeneration of both eyes without subfoveal involvement 10/13/2019   Bilateral epiretinal membrane 10/13/2019   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 choroidal neovascularization ([place]) 10/13/2019   Groin hematoma 06/09/2019   Colon cancer screening 09/16/2017   Severe diabetic hypoglycemia ([place]) 77/82/4235   Metabolic acidosis 36/14/4315   Elevated troponin 03/07/2016   Pneumonia 03/07/2016   Septic shock ([place]) 03/07/2016   CAD (coronary artery disease) 11/11/2013   HTN (hypertension) 11/11/2013   PAD (peripheral artery disease) ([place]) 11/11/2013   Hyperlipidemia 11/11/2013   Pyuria 05/12/2013   Hypomagnesemia 03/03/2012   Hypothyroidism 01/28/2012   Anemia 09/27/2011   Adrenal insufficiency (Addison's disease) ([place]) 08/31/2011   Wound, open, leg 07/19/2011   Chronic kidney disease, stage III (moderate) ([place]) 05/14/2011   Diabetic retinopathy ([place]) 11/27/2010   History of two vessel coronary artery bypass graft 11/27/2010   Hyperkalemia 11/27/2010   Iron deficiency 11/27/2010   PERTINENT HISTORY: Kendra Walker is a 71 y.o. year old female with acute/subacute infarct to the left parietal region 03/06/2021.  Hx of  Small left cortical infarct on 4/00/8676 likely embolic secondary to unclear source s/p ILR. Vascular risk factors include prior stroke on imaging, HTN, HLD, DM type 1 and PAD. Residual deficit: Mild RUE weakness and word finding difficulty mild memory loss (?very mild after stroke in  09/2020 and possibly worsened after 02/2021 stroke).  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 No    GOALS: Goals reviewed with patient? Yes   SHORT TERM GOALS: Target date: 09/07/2021   Pt will implement memory strategies in functional therapy activities with 90% acc with mi/mod cues Baseline: 40% Goal status: MET   2.  Pt will utilize external memory strategies in home environment by recording 3 items daily in planner, notebook, daily memory writing task daily for 5/7 days Baseline: Not using a system at this time Goal status: Partially Met   3.  Pt will complete moderate-level thought organization and planning activities with 90% acc and min assist. Baseline: 75% Goal status: MET   4.  Pt will implement word-finding strategies with 90% accuracy when unable to verbalize desired word in conversation/functional tasks with min assist.    Baseline: mi/mod assist Goal status: MET     LONG TERM GOALS: Same as short term goals  ASSESSMENT:   CLINICAL IMPRESSION: Patient is a 71 y.o. female who was seen today for a cognitive linguistic evaluation by Frann Rider, NP due to cognitive linguistic changes following previous strokes. Pt scored a 17/30 on the [facility] with deficits in mental calculations, attention, divergent naming (11 in 60 seconds), immediate and delayed recall, visuospatial skills, and executive functioning. She recalled 2/5 words after a few minute delay, wan unable to repeat 3 digits in reverse, and wrote the numbers on the clock backwards (going 12, 11, 10 etc) and unable to identify what "ten minutes to eleven o'clock" would be digitally. She showed relative strengths in orientation, verbal expression, and auditory comprehension of paragraph length information (8/8). She completed a moderately complex planning task (scheduling a busy day with specific parameters) with ~80% acc but with disregard for appropriate times for each task. She lives at home with her husband and her mother and she reports feeling frustrated when she is unable to remember  the names of specific things (she gives the example that she enjoys [employer] and cannot recall the type of house they frequently show). She does not currently use written cues to assist her with memory deficits and her husband provides a lot of support.    OBJECTIVE IMPAIRMENTS include attention, memory, and executive functioning. These impairments are limiting patient from managing medications, managing appointments, managing finances, and household responsibilities. Factors affecting potential to achieve goals and functional outcome are ability to learn/carryover information and severity of impairments. Patient will benefit from skilled SLP services to address above impairments and improve overall function.   REHAB POTENTIAL: Good   PLAN: SLP FREQUENCY: 1x/week   SLP DURATION: 3 weeks   PLANNED INTERVENTIONS: Cueing hierachy, Cognitive reorganization, Internal/external aids, Functional tasks, Multimodal communication approach, SLP instruction and feedback, Compensatory strategies, and Patient/family education  OBJECTIVE:   TODAY'S TREATMENT: Pt was accompanied to therapy by her husband. SLP provided min cues during cognitive communication tasks targeting memory and word recall. She completed naming to description task with 90% acc with min cues (Pt encouraged to verbally describe to self cue, divert attention and return to task if stuck, and was given first letter cues as needed).  SLP noted what seemed to be verbal paraphasias occasionally ("[place]" for walrus), however Pt and spouse indicated that she would do this at baseline. She completed divergent naming tasks with 100% acc and min assist. Memory goals targeted with cues for use of association strategies as needed. She indicated that she writes down important information in her calendar for recall. She reports feeling pleased with her progress and does not wish to attend SLP therapy going forward.  SPEECH THERAPY DISCHARGE SUMMARY  Visits  from Start of Care: 4  Current functional level related to goals / functional outcomes: Goals met, see above   Remaining deficits: Min working Architect / Equipment: Completed   Patient agrees to discharge. Patient goals were met. Patient is being discharged due to being pleased with the current functional level..       Thank you,  Genene Churn, [place]  [place], [place] 08/22/2021, 2:27 PM

## 2021-08-28 ENCOUNTER — Ambulatory Visit (INDEPENDENT_AMBULATORY_CARE_PROVIDER_SITE_OTHER): Payer: Medicare Other

## 2021-08-28 DIAGNOSIS — I639 Cerebral infarction, unspecified: Secondary | ICD-10-CM | POA: Diagnosis not present

## 2021-08-28 LAB — CUP PACEART REMOTE DEVICE CHECK
Date Time Interrogation Session: 20230804231007
Implantable Pulse Generator Implant Date: 20220926

## 2021-08-29 ENCOUNTER — Telehealth: Payer: Self-pay | Admitting: Neurology

## 2021-08-29 ENCOUNTER — Encounter: Payer: Self-pay | Admitting: Neurology

## 2021-08-29 ENCOUNTER — Ambulatory Visit (INDEPENDENT_AMBULATORY_CARE_PROVIDER_SITE_OTHER): Payer: Medicare Other | Admitting: Neurology

## 2021-08-29 VITALS — BP 157/63 | HR 65 | Ht 61.0 in | Wt 120.0 lb

## 2021-08-29 DIAGNOSIS — G478 Other sleep disorders: Secondary | ICD-10-CM | POA: Diagnosis not present

## 2021-08-29 DIAGNOSIS — R0683 Snoring: Secondary | ICD-10-CM | POA: Diagnosis not present

## 2021-08-29 DIAGNOSIS — R519 Headache, unspecified: Secondary | ICD-10-CM | POA: Diagnosis not present

## 2021-08-29 DIAGNOSIS — I639 Cerebral infarction, unspecified: Secondary | ICD-10-CM

## 2021-08-29 NOTE — Patient Instructions (Signed)

## 2021-08-29 NOTE — Telephone Encounter (Signed)
error 

## 2021-08-29 NOTE — Progress Notes (Signed)
Subjective:    Patient ID: Kendra Walker is a 71 y.o. female.  HPI     Kendra Age, MD, PhD Kendra Walker [address], [address] [place], [place] [address]  Dear Kendra Walker and Kendra Walker,   I saw your patient, Kendra Walker, upon your kind request in my sleep clinic today for initial consultation of her sleep disorder, in particular, concern for underlying obstructive sleep apnea.  The patient is accompanied by her husband today.  As you know, Kendra Walker is a 71 year old right-handed woman with an underlying medical history of stroke, peripheral artery disease, hypothyroidism, hypertension, hyperlipidemia, type I diabetes, squamous cell cancer, coronary artery disease with status post bypass surgery, status post loop recorder placement, status post tonsillectomy, status post multiple right ankle surgeries, who reports snoring and excessive daytime somnolence.  I reviewed your office note from 07/18/2021.  Her Epworth sleepiness score is 5 out of 24, fatigue severity score is 40 out of 63.  She does not always wake up rested.  She typically sleeps on her back or her sides but she does have a Dexcom and tries to sleep on the other side.  She also gets woken up by her pump beeping.  She tries to be in bed between 9 and 9:30 PM.  Her rise time is generally between 7 and 8.  She is retired, lives with her husband and her 38 year old mom lives with them.  They have 1 dog in the household.  She has had occasional morning headaches but also reports neck pain.  She has no nightly nocturia.  She has some discomfort from the loop recorder and does not sleep on her left side very much but typically sleeps on her back and one of her sides.  She drinks caffeine in the form of coffee, up to 2 cups in the mornings typically and 2 bottles of diet soda per day on average.  She has a TV on in her bedroom at night and her husband typically turns it off before falling asleep.  She is not aware  of any family history of sleep apnea, perhaps a cousin.  She does not drink any alcohol and she is a non-smoker.  Her Past Medical History Is Significant For: Past Medical History:  Diagnosis Date   Cancer ([place])    Squamous Cell   Diabetes mellitus without complication ([place])    Hyperlipemia    Hypertension    Hypothyroidism    PAD (peripheral artery disease) ([place])    Stroke [name])     Her Past Surgical History Is Significant For: Past Surgical History:  Procedure Laterality Date   ABDOMINAL AORTOGRAM W/LOWER EXTREMITY N/A 06/09/2019   Procedure: ABDOMINAL AORTOGRAM W/LOWER EXTREMITY;  Surgeon: Serafina Mitchell, MD;  Location: [place] [facility];  Service: Cardiovascular;  Laterality: N/A;   APPENDECTOMY     CATARACT EXTRACTION Bilateral    CORONARY ARTERY BYPASS GRAFT     LOOP RECORDER INSERTION N/A 10/17/2020   Procedure: LOOP RECORDER INSERTION;  Surgeon: Deboraha Sprang, MD;  Location: [place] [facility];  Service: Cardiovascular;  Laterality: N/A;   PERIPHERAL VASCULAR INTERVENTION Bilateral 06/09/2019   Procedure: PERIPHERAL VASCULAR INTERVENTION;  Surgeon: Serafina Mitchell, MD;  Location: [place] [facility];  Service: Cardiovascular;  Laterality: Bilateral;   TONSILLECTOMY     YAG LASER APPLICATION Right 10/06/7827   Procedure: YAG LASER APPLICATION;  Surgeon: Williams Che, MD;  Location: [facility];  Service: Ophthalmology;  Laterality:  Right;    Her Family History Is Significant For: Family History  Problem Relation Walker of Onset   Heart disease Mother    Hypertension Mother    Hypertension Father    Sleep apnea Neg Hx     Her Social History Is Significant For: Social History   Socioeconomic History   Marital status: Married    Spouse name: [name]   Number of children: Not on file   Years of education: Not on file   Highest education level: Not on file  Occupational History   Not on file  Tobacco Use   Smoking status: Never   Smokeless tobacco: Never  Vaping  Use   Vaping Use: Never used  Substance and Sexual Activity   Alcohol use: No    Alcohol/week: 0.0 standard drinks of alcohol   Drug use: No   Sexual activity: Not on file  Other Topics Concern   Not on file  Social History Narrative   Lives with husband   Social Determinants of Health   Financial Resource Strain: Not on file  Food Insecurity: Not on file  Transportation Needs: Not on file  Physical Activity: Not on file  Stress: Not on file  Social Connections: Not on file    Her Allergies Are:  Allergies  Allergen Reactions   Bactrim [Sulfamethoxazole-Trimethoprim] Other (See Comments)    Elevates potassium.    Tape Other (See Comments)    not allergic but causes sensitivity.   Fluorescein Rash   Procrit [Epoetin (Alfa)] Rash  :   Her Current Medications Are:  Outpatient Encounter Medications as of 08/29/2021  Medication Sig   acetaminophen (TYLENOL) 500 MG tablet Take 500 mg by mouth every 6 (six) hours as needed for mild pain or headache.    amLODipine (NORVASC) 5 MG tablet Take 2.5 mg by mouth daily.   Ascorbic Acid (VITAMIN C WITH ROSE HIPS) 500 MG tablet Take 500 mg by mouth at bedtime.   Calcium Carbonate (CALCIUM 600 PO) Take 600 mg by mouth in the morning and at bedtime.   Cholecalciferol (VITAMIN D3) 50 MCG (2000 UT) TABS Take 2,000 Units by mouth daily.   clopidogrel (PLAVIX) 75 MG tablet Take 1 tablet (75 mg total) by mouth daily.   fludrocortisone (FLORINEF) 0.1 MG tablet Take 0.05 mg by mouth daily.   GLUCAGEN HYPOKIT 1 MG SOLR injection Inject into the muscle.   HUMALOG 100 UNIT/ML injection Inject into the skin See admin instructions. VIA INSULIN PUMP   Insulin Human (INSULIN PUMP) SOLN Inject into the skin. HUMALOG 100 UNIT/ML   iron polysaccharides (NIFEREX) 150 MG capsule Take 150 mg by mouth at bedtime.    levothyroxine (SYNTHROID) 112 MCG tablet Take 112 mcg by mouth daily.   magnesium chloride (SLOW-MAG) 64 MG TBEC SR tablet Take 1-2 tablets by  mouth See admin instructions. TAKE 1 TABLET IN THE MORNING & TAKE 2 TABLETS AT NIGHT.   Multiple Vitamin (MULTIVITAMIN WITH MINERALS) TABS tablet Take 1 tablet by mouth daily.   Multiple Vitamins-Minerals (PRESERVISION AREDS 2 PO) Take 1 tablet by mouth daily.    Omega-3 Fatty Acids (FISH OIL) 1200 MG CAPS Take 1,200 mg by mouth daily.   predniSONE (DELTASONE) 5 MG tablet Take 5 mg by mouth daily with breakfast.   rosuvastatin (CRESTOR) 40 MG tablet Take 1 tablet (40 mg total) by mouth daily.   sodium bicarbonate 650 MG tablet Take 650 mg by mouth daily.   VELTASSA 8.4 g packet Take 8.4 g  by mouth once a week.   No facility-administered encounter medications on file as of 08/29/2021.  :   Review of Systems:  Out of a complete 14 point review of systems, all are reviewed and negative with the exception of these symptoms as listed below:  Review of Systems  Neurological:        Pt here for sleep consult   Pt snores.few headaches,some fatigue ,hypertension Pt denies sleep study,CPAP machine Pt states CVA 09/2020    ESS;5 FSS:40       Objective:  Neurological Exam  Physical Exam Physical Examination:   Vitals:   08/29/21 1110  BP: (!) 157/63  Pulse: 65    General Examination: The patient is a very pleasant 71 y.o. female in no acute distress. She appears well-developed and well-nourished and well groomed.   HEENT: Normocephalic, atraumatic, pupils are equal, round and reactive to light, extraocular tracking is good without limitation to gaze excursion or nystagmus noted. Hearing is grossly intact. Face is symmetric with normal facial animation. Speech is clear with no dysarthria noted. There is no hypophonia. There is no lip, neck/head, jaw or voice tremor. Neck is supple with full range of passive and active motion. There are no carotid bruits on auscultation. Oropharynx exam reveals: mild mouth dryness, adequate dental hygiene and moderate airway crowding, due to small airway  entry and redundant soft palate. Mallampati is class III. Tongue protrudes centrally and palate elevates symmetrically. Tonsils are absent. Neck size is 15.25 inches. She has a Mild overbite. Nasal inspection reveals smaller nasal passages.    Chest: Clear to auscultation without wheezing, rhonchi or crackles noted.  Heart: S1+S2+0, regular and normal without murmurs, rubs or gallops noted.   Abdomen: Soft, non-tender and non-distended with normal bowel sounds appreciated on auscultation.  Extremities: There is no pitting edema in the distal lower extremities bilaterally.   Skin: Warm and dry with multiple bruises.  Of note, she is on Plavix.   Musculoskeletal: exam reveals right ankle flexion deformity and multiple scars.    Neurologically:  Mental status: The patient is awake, alert and oriented in all 4 spheres. Her immediate and remote memory, attention, language skills and fund of knowledge are appropriate. There is no evidence of aphasia, agnosia, apraxia or anomia. Speech is clear with normal prosody and enunciation. Thought process is linear. Mood is normal and affect is normal.  Cranial nerves II - XII are as described above under HEENT exam.  Motor exam: Normal bulk, strength and tone is noted on the left, mild right-sided weakness noted.  No obvious tremor. Fine motor skills and coordination: grossly intact.  Cerebellar testing: No dysmetria or intention tremor. There is no truncal or gait ataxia.  Sensory exam: intact to light touch in the upper and lower extremities.  Gait, station and balance: She stands with difficulty, she walks with a single-point cane.  She walks with a limp and fixed right ankle flexion.    Assessment and Plan:   In summary, Kendra Walker is a very pleasant 71 y.o.-year old female with an underlying medical history of stroke, peripheral artery disease, hypothyroidism, hypertension, hyperlipidemia, type I diabetes, squamous cell cancer, coronary artery  disease with status post bypass surgery, status post loop recorder placement, status post tonsillectomy, status post multiple right ankle surgeries, whose history and physical exam are concerning for sleep disordered breathing, supporting a current working diagnosis of unspecified sleep apnea, with the main differential diagnoses of obstructive sleep apnea (OSA) versus upper airway  resistance syndrome (UARS) versus central sleep apnea (CSA), or mixed sleep apnea. A laboratory attended sleep study is considered gold standard for evaluation of sleep disordered breathing and is recommended at this time and clinically justified.   I had a long chat with the patient and her husband about my findings and the diagnosis of sleep apnea, particularly OSA, its prognosis and treatment options. We talked about medical/conservative treatments, surgical interventions and non-pharmacological approaches for symptom control. I explained, in particular, the risks and ramifications of untreated moderate to severe OSA, especially with respect to developing cardiovascular disease down the road, including congestive heart failure (CHF), difficult to treat hypertension, cardiac arrhythmias (particularly A-fib), neurovascular complications including TIA, stroke and dementia. Even type 2 diabetes has, in part, been linked to untreated OSA. Symptoms of untreated OSA may include (but may not be limited to) daytime sleepiness, nocturia (i.e. frequent nighttime urination), memory problems, mood irritability and suboptimally controlled or worsening mood disorder such as depression and/or anxiety, lack of energy, lack of motivation, physical discomfort, as well as recurrent headaches, especially morning or nocturnal headaches. We talked about the importance of maintaining a healthy lifestyle and striving for healthy weight. In addition, we talked about the importance of striving for and maintaining good sleep hygiene. I recommended the following  at this time: sleep study.  I outlined the differences between a laboratory attended sleep study which is considered more comprehensive and accurate over the option of a home sleep test (HST); the latter may lead to underestimation of sleep disordered breathing in some instances and does not help with diagnosing upper airway resistance syndrome and is not accurate enough to diagnose primary central sleep apnea typically. I explained the different sleep test procedures to the patient in detail and also outlined possible surgical and non-surgical treatment options of OSA, including the use of a pressure airway pressure (PAP) device (ie CPAP, AutoPAP/APAP or BiPAP in certain circumstances), a custom-made dental device (aka oral appliance, which would require a referral to a specialist dentist or orthodontist typically, and is generally speaking not considered a good choice for patients with full dentures or edentulous state), upper airway surgical options, such as traditional UPPP (which is not considered a first-line treatment) or the Inspire device (hypoglossal nerve stimulator, which would involve a referral for consultation with an ENT surgeon, after careful selection, following inclusion criteria). I explained the PAP treatment option to the patient in detail, as this is generally considered first-line treatment.  The patient indicated that she would be willing to try PAP therapy, if the need arises. I explained the importance of being compliant with PAP treatment, not only for insurance purposes but primarily to improve patient's symptoms symptoms, and for the patient's long term health benefit, including to reduce Her cardiovascular risks longer-term.    We will pick up our discussion about the next steps and treatment options after testing.  We will keep them posted as to the test results by phone call and/or MyChart messaging where possible.  We will plan to follow-up in sleep clinic accordingly as well.  I  answered all their questions today and the patient and her husband were in agreement.   I encouraged them to call with any interim questions, concerns, problems or updates or email [place] through [place].  Generally speaking, sleep test authorizations may take up to 2 weeks, sometimes less, sometimes longer, the patient is encouraged to get in touch with [place] if they do not hear back from the sleep lab staff  directly within the next 2 weeks.  Thank you very much for allowing me to participate in the care of this nice patient. If I can be of any further assistance to you please do not hesitate to talk to me.  Sincerely,   Kendra Age, MD, PhD

## 2021-08-31 ENCOUNTER — Telehealth: Payer: Self-pay

## 2021-08-31 NOTE — Telephone Encounter (Signed)
LVM for pt to call back to schedule sleep study.  

## 2021-09-04 NOTE — Telephone Encounter (Signed)
Patient called back.  NPSG- Medicare/mutual of [place] no auth req  She is scheduled at [facility] 10/02/21 at 8 pm.  Mailed packet to the patient.

## 2021-09-06 DIAGNOSIS — Z1231 Encounter for screening mammogram for malignant neoplasm of breast: Secondary | ICD-10-CM | POA: Diagnosis not present

## 2021-09-27 NOTE — Progress Notes (Signed)
Carelink Summary Report / Loop Recorder 

## 2021-10-02 ENCOUNTER — Ambulatory Visit (INDEPENDENT_AMBULATORY_CARE_PROVIDER_SITE_OTHER): Payer: Medicare Other

## 2021-10-02 ENCOUNTER — Ambulatory Visit (INDEPENDENT_AMBULATORY_CARE_PROVIDER_SITE_OTHER): Payer: Medicare Other | Admitting: Neurology

## 2021-10-02 DIAGNOSIS — R0683 Snoring: Secondary | ICD-10-CM | POA: Diagnosis not present

## 2021-10-02 DIAGNOSIS — G4720 Circadian rhythm sleep disorder, unspecified type: Secondary | ICD-10-CM

## 2021-10-02 DIAGNOSIS — G478 Other sleep disorders: Secondary | ICD-10-CM

## 2021-10-02 DIAGNOSIS — I639 Cerebral infarction, unspecified: Secondary | ICD-10-CM

## 2021-10-02 DIAGNOSIS — R519 Headache, unspecified: Secondary | ICD-10-CM

## 2021-10-03 ENCOUNTER — Telehealth: Payer: Self-pay | Admitting: *Deleted

## 2021-10-03 LAB — CUP PACEART REMOTE DEVICE CHECK
Date Time Interrogation Session: 20230906230850
Implantable Pulse Generator Implant Date: 20220926

## 2021-10-03 NOTE — Chronic Care Management (AMB) (Signed)
  Care Coordination   Note   10/03/2021 Name: TAWNY RASPBERRY MRN: [id number] DOB: 1950/01/24  ICIE KUZNICKI is a 71 y.o. year old female who sees Bur[place], Virgina Evener, MD for primary care. I reached out to [place]is [place] by phone today to offer care coordination services.  Ms. Blatt was given information about Care Coordination services today including:   The Care Coordination services include support from the care team which includes your Nurse Coordinator, Clinical Social Worker, or Pharmacist.  The Care Coordination team is here to help remove barriers to the health concerns and goals most important to you. Care Coordination services are voluntary, and the patient may decline or stop services at any time by request to their care team member.   Care Coordination Consent Status: Patient did not agree to participate in care coordination services at this time.  Encounter Outcome:  Pt. Refused  [place]  Direct Dial: [phone]

## 2021-10-04 ENCOUNTER — Telehealth: Payer: Self-pay | Admitting: Internal Medicine

## 2021-10-04 NOTE — Telephone Encounter (Signed)
Returned patients phone call.  Patient reports of device feeling like its moved and a stabbing pain at time. Patient denies any redness, swelling or drainage. Advised if she were to have any prior to apt. Call to let us know. Patient voiced understanding and agreeable to plan.

## 2021-10-04 NOTE — Telephone Encounter (Signed)
  1. Has your device fired? No   2. Is you device beeping? No   3. Are you experiencing draining or swelling at device site? No   4. Are you calling to see if we received your device transmission? No   5. Have you passed out? No    Patient states she has been having pain at her loop recorder site for the past few months that gets so severe it bring tears to her eyes at times. She reports it feels as if it may be moving. An appt has been scheduled for 09/28 with [name] per her request regarding this. Please advise.     Please route to [place]

## 2021-10-10 DIAGNOSIS — E038 Other specified hypothyroidism: Secondary | ICD-10-CM | POA: Diagnosis not present

## 2021-10-10 DIAGNOSIS — E063 Autoimmune thyroiditis: Secondary | ICD-10-CM | POA: Diagnosis not present

## 2021-10-10 DIAGNOSIS — E10649 Type 1 diabetes mellitus with hypoglycemia without coma: Secondary | ICD-10-CM | POA: Diagnosis not present

## 2021-10-12 NOTE — Procedures (Signed)
[facility] POLYSOMNOGRAPHY  INTERPRETATION REPORT   STUDY DATE:  10/02/2021     PATIENT NAME:  Kendra Walker         DATE OF BIRTH:  [birth year]  PATIENT ID:  [id number]    TYPE OF STUDY:  PSG  READING PHYSICIAN: Star Age, MD REFERRED BY: Frann Rider, NP SCORING TECHNICIAN: Richard Miu, RPSGT   HISTORY: 71 year old right-handed woman with an underlying medical history of stroke, peripheral artery disease, hypothyroidism, hypertension, hyperlipidemia, type I diabetes, squamous cell cancer, coronary artery disease with status post bypass surgery, status post loop recorder placement, status post tonsillectomy, status post multiple right ankle surgeries, who reports snoring and excessive daytime somnolence. Her Epworth sleepiness score is 5 out of 24, fatigue severity score is 40 out of 63. Height: 61 in Weight: 120 lb (BMI 22) Neck Size: 15 in  MEDICATIONS: Acetaminophen, Norvasc, Vitamin C, Calcium Carbonate, Vitamin D 3, Plavix, Florinef, Glucagen Hypokit, Humalog, Niferex, Synthroid, Magnesium, Multivitamin, Fish Oil, Deltasone, Crestor, Sodium Bicarbonate, Veltassa TECHNICAL DESCRIPTION: A registered sleep technologist  was in attendance for the duration of the recording.  Data collection, scoring, video monitoring, and reporting were performed in compliance with the AASM Manual for the Scoring of Sleep and Associated Events; (Hypopnea is scored based on the criteria listed in Section VIII D. 1b in the AASM Manual V2.6 using a 4% oxygen desaturation rule or Hypopnea is scored based on the criteria listed in Section VIII D. 1a in the AASM Manual V2.6 using 3% oxygen desaturation and /or arousal rule).   SLEEP CONTINUITY AND SLEEP ARCHITECTURE:  Lights-out was at 20:51: and lights-on at  05:15:, with a total recording time of 8 hours and 24.5 minutes. Total sleep time ( TST) was 391.0 minutes with a decreased sleep efficiency at 77.5%.   BODY POSITION:   TST was divided  between the following sleep positions: 77.0% supine;  23.0% lateral;  0% prone. Duration of total sleep and percent of total sleep in their respective position is as follows: supine 301 minutes (77%), non-supine 90 minutes (23%); right 90 minutes (23%), left 00 minutes (0%), and prone 00 minutes (0%).  Total supine REM sleep time was 34 minutes (59% of total REM sleep). Sleep latency was increased at 90.0 minutes.  REM sleep latency was normal at 68.0 minutes. Of the total sleep time, the percentage of stage N1 sleep was 2.2%, stage N2 sleep was 47%, which is normal, stage N3 sleep was 36.1%, whichi is increased, and REM sleep was 14.8%, which is reduced. Wake after sleep onset (WASO) time accounted for 23.5 minutes with mild to moderate sleep fragmentation noted.  RESPIRATORY MONITORING:  Based on CMS criteria (using a 4% oxygen desaturation rule for scoring hypopneas), there were 2 apneas (1 obstructive; 1 central; 0 mixed), and 3 hypopneas.  Apnea index was 0.3. Hypopnea index was 0.5. The apnea-hypopnea index was 0.8/hour overall (0.8 supine, 3 non-supine; 3.1 REM, 3.5 supine REM).  There were 0 respiratory effort-related arousals (RERAs).  The RERA index was 0 events/h. Total respiratory disturbance index (RDI) was 0.8 events/h. RDI results showed: supine RDI  0.8 /h; non-supine RDI 0.7 /h; REM RDI 3.1 /h, supine REM RDI 3.5 /h.   Based on AASM criteria (using a 3% oxygen desaturation and /or arousal rule for scoring hypopneas), there were 2 apneas (1 obstructive; 1 central; 0 mixed), and 4 hypopneas. Apnea index was 0.3. Hypopnea index was 0.6. The apnea-hypopnea index was 0.9 overall (1.0 supine,  3 non-supine; 3.1 REM, 3.5 supine REM).  There were 0 respiratory effort-related arousals (RERAs).  The RERA index was 0 events/h. Total respiratory disturbance index (RDI) was 0.9 events/h. RDI results showed: supine RDI  1.0 /h; non-supine RDI 0.7 /h; REM RDI 3.1 /h, supine REM RDI 3.5  /h.  OXIMETRY: Oxyhemoglobin Saturation Nadir during sleep was at  91%) from a mean of 95%.  Of the Total sleep time (TST)   hypoxemia (=<88%) was present for  0.0 minutes, or 0.0% of total sleep time.  LIMB MOVEMENTS: There were 27 periodic limb movements of sleep (4.1/hr), of which 1 (0.2/hr) were associated with an arousal. AROUSAL: There were 141 arousals in total, for an arousal index of 22 arousals/hour.  Of these, 5 were identified as respiratory-related arousals (1 /h), 1 were PLM-related arousals (0 /h), and 150 were non-specific arousals (23 /h).  Snoring was classified as mild, intermittent. EEG:  The EEG was of normal amplitude and frequency, with symmetric manifestation of sleep stages. EKG: The electrocardiogram showed normal sinus rhythm with sinus arrhythmia.  The average heart rate during sleep was 68 bpm.  The heart rate during sleep varied between a minimum of N/A and  a maximum of  91 bpm. AUDIO and VIDEO:  The video and audio analysis did not show any abnormal or unusual behaviors, movements, phonations or vocalizations. The patient took no bathroom breaks. Post study, the patient indicated, that sleep was the same as usual.  IMPRESSION: 1. Primary Snoring 2. Dysfunctions associated with sleep stages or arousal from sleep Recommendation:  1. This study does not demonstrate any significant obstructive or central sleep disordered breathing with an AHI of less than 5/hour. Her total AHI was 0.77/hour - and oxygen saturations remained at or above 91% for the night. Mild intermittent snoring was noted. Treatment with a positive airway pressure device, such as CPAP or autoPAP is not indicated. This study shows sleep fragmentation and abnormal sleep stage percentages; these are nonspecific findings and per se do not signify an intrinsic sleep disorder or a cause for the patient's sleep-related symptoms. Causes include (but are not limited to) the first night effect of the sleep study,  circadian rhythm disturbances, medication effect or an underlying mood disorder or medical problem.  2. This study shows sleep fragmentation and abnormal sleep stage percentages; these are nonspecific findings and per se do not signify an intrinsic sleep disorder or a cause for the patient's sleep-related symptoms. Causes include (but are not limited to) the first night effect of the sleep study, circadian rhythm disturbances, medication effect or an underlying mood disorder or medical problem.  3. The patient should be cautioned not to drive, work at heights, or operate dangerous or heavy equipment when tired or sleepy. Review and reiteration of good sleep hygiene measures should be pursued with any patient. 4. The patient will be advised to follow up with the referring provider, who will be notified of the test results.   I certify that I have reviewed the entire raw data recording prior to the issuance of this report in accordance with the Standards of Accreditation of the American Academy of Sleep Medicine (AASM).  Star Age, MD, PhD Diplomat, ABPN (Neurology and Sleep)

## 2021-10-16 ENCOUNTER — Telehealth: Payer: Self-pay | Admitting: *Deleted

## 2021-10-16 NOTE — Telephone Encounter (Signed)
-----   Message from Star Age, MD sent at 10/12/2021  6:41 PM EDT ----- Patient referred by Frann Rider, NP, seen by me on 08/29/21, diagnostic PSG on 10/02/21.   Please call and notify the patient that the recent sleep study did not show any significant obstructive sleep apnea. Her total AHI was 0.77/hour - and oxygen saturations remained at or above 91% for the night. Mild intermittent snoring was noted. Treatment with a positive airway pressure device, such as CPAP or autoPAP is not indicated. Mild, intermittent snoring was noted. At this juncture, she can FU with Dr. Leonie Man and [name] as planned/scheduled. Thanks,  Star Age, MD, PhD [facility])

## 2021-10-16 NOTE — Telephone Encounter (Signed)
I called the pt and LVM (ok per DPR) advising her of the sleep study results as noted below by Dr Rexene Alberts. Advised at this juncture patient can follow-up with Jessica/Dr Leonie Man as scheduled on 01/17/22 at 245 pm. Left office number in message in case she has any questions.

## 2021-10-17 DIAGNOSIS — E1342 Other specified diabetes mellitus with diabetic polyneuropathy: Secondary | ICD-10-CM | POA: Diagnosis not present

## 2021-10-17 DIAGNOSIS — L84 Corns and callosities: Secondary | ICD-10-CM | POA: Diagnosis not present

## 2021-10-18 DIAGNOSIS — L7211 Pilar cyst: Secondary | ICD-10-CM | POA: Diagnosis not present

## 2021-10-18 DIAGNOSIS — L729 Follicular cyst of the skin and subcutaneous tissue, unspecified: Secondary | ICD-10-CM | POA: Diagnosis not present

## 2021-10-19 ENCOUNTER — Encounter: Payer: Self-pay | Admitting: Student

## 2021-10-19 ENCOUNTER — Encounter: Payer: Self-pay | Admitting: *Deleted

## 2021-10-19 ENCOUNTER — Telehealth: Payer: Self-pay

## 2021-10-19 ENCOUNTER — Ambulatory Visit: Payer: Medicare Other | Attending: Student | Admitting: Student

## 2021-10-19 VITALS — BP 159/69 | HR 73 | Ht 61.0 in | Wt 119.0 lb

## 2021-10-19 DIAGNOSIS — N644 Mastodynia: Secondary | ICD-10-CM | POA: Diagnosis not present

## 2021-10-19 DIAGNOSIS — I639 Cerebral infarction, unspecified: Secondary | ICD-10-CM | POA: Insufficient documentation

## 2021-10-19 NOTE — Progress Notes (Signed)
Electrophysiology Office Note Date: 10/19/2021  ID:  Kendra Walker, DOB 11-02-1950, MRN [id number]  PCP: Curlene Labrum, MD Primary Cardiologist: Carlyle Dolly, MD Electrophysiologist: Virl Axe, MD   CC: ILR follow-up  Kendra Walker is a 71 y.o. female seen today for Dr. Caryl Comes . she presents today for acute visit due to pain at loop recorder site.    Patient reports new pain at her loop site over the past 3-4 months. Presents as a sudden, sharp pain that radiates into her left shoulder at times. Of note, she had unremarkable Mammogram 09/06/2021. She has not had a manual breast exam or ultrasound.  She feels like the loop recorder has moved. She has felt it in a horizontal or vertical position at various times. She denies any trauma to the area, and these symptoms started prior to her mammogram.  she denies chest pain, palpitations, dyspnea, PND, orthopnea, nausea, vomiting, dizziness, syncope, edema, weight gain, or early satiety. .  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  Past Medical History:  Diagnosis Date   Cancer ([place])    Squamous Cell   Diabetes mellitus without complication ([place])    Hyperlipemia    Hypertension    Hypothyroidism    PAD (peripheral artery disease) ([place])    Stroke [facility])    Past Surgical History:  Procedure Laterality Date   ABDOMINAL AORTOGRAM W/LOWER EXTREMITY N/A 06/09/2019   Procedure: ABDOMINAL AORTOGRAM W/LOWER EXTREMITY;  Surgeon: Serafina Mitchell, MD;  Location: [place] CV [facility];  Service: Cardiovascular;  Laterality: N/A;   APPENDECTOMY     CATARACT EXTRACTION Bilateral    CORONARY ARTERY BYPASS GRAFT     LOOP RECORDER INSERTION N/A 10/17/2020   Procedure: LOOP RECORDER INSERTION;  Surgeon: Deboraha Sprang, MD;  Location: [place] [facility];  Service: Cardiovascular;  Laterality: N/A;   PERIPHERAL VASCULAR INTERVENTION Bilateral 06/09/2019   Procedure: PERIPHERAL VASCULAR INTERVENTION;   Surgeon: Serafina Mitchell, MD;  Location: [place] [facility];  Service: Cardiovascular;  Laterality: Bilateral;   TONSILLECTOMY     YAG LASER APPLICATION Right 9/92/4268   Procedure: YAG LASER APPLICATION;  Surgeon: Williams Che, MD;  Location: [facility];  Service: Ophthalmology;  Laterality: Right;    Current Outpatient Medications  Medication Sig Dispense Refill   acetaminophen (TYLENOL) 500 MG tablet Take 500 mg by mouth every 6 (six) hours as needed for mild pain or headache.      amLODipine (NORVASC) 5 MG tablet Take 2.5 mg by mouth daily.     Ascorbic Acid (VITAMIN C WITH ROSE HIPS) 500 MG tablet Take 500 mg by mouth at bedtime.     Calcium Carbonate (CALCIUM 600 PO) Take 600 mg by mouth in the morning and at bedtime.     Cholecalciferol (VITAMIN D3) 50 MCG (2000 UT) TABS Take 2,000 Units by mouth daily.     clopidogrel (PLAVIX) 75 MG tablet Take 1 tablet (75 mg total) by mouth daily. 90 tablet 3   fludrocortisone (FLORINEF) 0.1 MG tablet Take 0.05 mg by mouth daily.     GLUCAGEN HYPOKIT 1 MG SOLR injection Inject into the muscle.     HUMALOG 100 UNIT/ML injection Inject into the skin See admin instructions. VIA INSULIN PUMP     Insulin Human (INSULIN PUMP) SOLN Inject into the skin. HUMALOG 100 UNIT/ML     iron polysaccharides (NIFEREX) 150 MG capsule Take 150 mg by mouth at bedtime.  levothyroxine (SYNTHROID) 112 MCG tablet Take 112 mcg by mouth daily.     magnesium chloride (SLOW-MAG) 64 MG TBEC SR tablet Take 1-2 tablets by mouth See admin instructions. TAKE 1 TABLET IN THE MORNING & TAKE 2 TABLETS AT NIGHT.     Multiple Vitamin (MULTIVITAMIN WITH MINERALS) TABS tablet Take 1 tablet by mouth daily.     Multiple Vitamins-Minerals (PRESERVISION AREDS 2 PO) Take 1 tablet by mouth daily.      Omega-3 Fatty Acids (FISH OIL) 1200 MG CAPS Take 1,200 mg by mouth daily.     predniSONE (DELTASONE) 5 MG tablet Take 5 mg by mouth daily with breakfast.     rosuvastatin (CRESTOR) 40 MG  tablet Take 1 tablet (40 mg total) by mouth daily. 90 tablet 1   sodium bicarbonate 650 MG tablet Take 650 mg by mouth daily.     VELTASSA 8.4 g packet Take 8.4 g by mouth once a week.     No current facility-administered medications for this visit.    Allergies:   Bactrim [sulfamethoxazole-trimethoprim], Tape, Fluorescein, and Procrit [epoetin (alfa)]   Social History: Social History   Socioeconomic History   Marital status: Married    Spouse name: [name]   Number of children: Not on file   Years of education: Not on file   Highest education level: Not on file  Occupational History   Not on file  Tobacco Use   Smoking status: Never   Smokeless tobacco: Never  Vaping Use   Vaping Use: Never used  Substance and Sexual Activity   Alcohol use: No    Alcohol/week: 0.0 standard drinks of alcohol   Drug use: No   Sexual activity: Not on file  Other Topics Concern   Not on file  Social History Narrative   Lives with husband   Social Determinants of Health   Financial Resource Strain: Not on file  Food Insecurity: Not on file  Transportation Needs: Not on file  Physical Activity: Not on file  Stress: Not on file  Social Connections: Not on file  Intimate Partner Violence: Not on file    Family History: Family History  Problem Relation Age of Onset   Heart disease Mother    Hypertension Mother    Hypertension Father    Sleep apnea Neg Hx      Review of Systems: All other systems reviewed and are otherwise negative except as noted above.  Physical Exam: Vitals:   10/19/21 1039  BP: (!) 159/69  Pulse: 73  Weight: 119 lb (54 kg)  Height: '5\' 1"'$  (1.549 m)     GEN- The patient is well appearing, alert and oriented x 3 today.   HEENT: normocephalic, atraumatic; sclera clear, conjunctiva pink; hearing intact; oropharynx clear; neck supple  Lungs- Clear to ausculation bilaterally, normal work of breathing.  No wheezes, rales, rhonchi Heart- Regular rate and rhythm,  no murmurs, rubs or gallops  GI- soft, non-tender, non-distended, bowel sounds present  Extremities- no clubbing, cyanosis, or edema  MS- no significant deformity or atrophy Skin- warm and dry, no rash or lesion; ILR pocket well healed. Loop appears to have migrated inferiorly and laterally from the incision site.  Psych- euthymic mood, full affect Neuro- strength and sensation are intact  PPM Interrogation- reviewed in detail today,  See PACEART report  EKG:  EKG is not ordered today.  Recent Labs: No results found for requested labs within last 365 days.   Wt Readings from Last 3 Encounters:  10/19/21 119 lb (54 kg)  08/29/21 120 lb (54.4 kg)  07/18/21 122 lb (55.3 kg)     Other studies Reviewed: Additional studies/ records that were reviewed today include: Previous EP office notes, Previous remote checks, Most recent labwork.   Assessment and Plan:  1. Cryptogenic Stroke s/p Medtronic Loop recorder Normal device function via [facility]. No episodes.  No changes today  2. Left breast pain At site of loop recorder but radiates with movement at times.  Mammogram 09/06/2021 without findings suspicious for malignancy, which is re-assuring. Pt also states she has "dense breasts" Recommend manual breast exam +/- [place] if it is felt her Mammogram would be inconclusive due to breast tissue.  Ultimately, if this does not resolve, she would like to be considered for loop recorder removal. Offered 10/4 and 10/25. She prefers to give it a little longer.    Disposition:   Follow up with Dr. Caryl Comes  in 4 Weeks to consider loop removal   Signed, Annamaria Helling  10/19/2021 10:45 AM  [facility] [address] [place] [address] [phone] (office) [phone] (fax)

## 2021-10-19 NOTE — Telephone Encounter (Signed)
Rq for plavix hold for cyst excision on her head placed on NP desk for review

## 2021-10-19 NOTE — Patient Instructions (Signed)
Medication Instructions:  Your physician recommends that you continue on your current medications as directed. Please refer to the Current Medication list given to you today.  *If you need a refill on your cardiac medications before your next appointment, please call your pharmacy*   Lab Work: None If you have labs (blood work) drawn today and your tests are completely normal, you will receive your results only by: [place] (if you have MyChart) OR A paper copy in the mail If you have any lab test that is abnormal or we need to change your treatment, we will call you to review the results.  Follow-Up: At [facility], you and your health needs are our priority.  As part of our continuing mission to provide you with exceptional heart care, we have created designated Provider Care Teams.  These Care Teams include your primary Cardiologist (physician) and Advanced Practice Providers (APPs -  Physician Assistants and Nurse Practitioners) who all work together to provide you with the care you need, when you need it.  We recommend signing up for the patient portal called "MyChart".  Sign up information is provided on this After Visit Summary.  MyChart is used to connect with patients for Virtual Visits (Telemedicine).  Patients are able to view lab/test results, encounter notes, upcoming appointments, etc.  Non-urgent messages can be sent to your provider as well.   To learn more about what you can do with MyChart, go to [email or web address].    Your next appointment:   As scheduled  Important Information About Sugar

## 2021-10-19 NOTE — Telephone Encounter (Signed)
Signed and placed in outbox.  Thank you. ?

## 2021-10-19 NOTE — Telephone Encounter (Signed)
Letter from Fara Olden, NP faxed to [facility] surgical specialists. Received confirmation.

## 2021-10-19 NOTE — Telephone Encounter (Signed)
Patient with history of stroke on Plavix. Recent stroke occurred in 02/2021, has been stable since that time. Okay to hold plavix as requested for 5 days prior excision of cyst from her head.  Small but acceptable risk of preprocedural stroke while off Plavix therapy.  Recommend restarting Plavix immediately after or once hemodynamically stable.   Please provide this information in a letter format and fax back to [facility] surgical specialists. Will place initial request form in outbox.  Thank you.

## 2021-10-19 NOTE — Progress Notes (Signed)
Carelink Summary Report / Loop Recorder 

## 2021-10-19 NOTE — Telephone Encounter (Signed)
Letter composed, printed as advised by NP. On NP desk for review, signature.

## 2021-10-30 DIAGNOSIS — I10 Essential (primary) hypertension: Secondary | ICD-10-CM | POA: Diagnosis not present

## 2021-10-30 DIAGNOSIS — D519 Vitamin B12 deficiency anemia, unspecified: Secondary | ICD-10-CM | POA: Diagnosis not present

## 2021-10-30 DIAGNOSIS — N189 Chronic kidney disease, unspecified: Secondary | ICD-10-CM | POA: Diagnosis not present

## 2021-10-30 DIAGNOSIS — E7849 Other hyperlipidemia: Secondary | ICD-10-CM | POA: Diagnosis not present

## 2021-10-30 DIAGNOSIS — E1121 Type 2 diabetes mellitus with diabetic nephropathy: Secondary | ICD-10-CM | POA: Diagnosis not present

## 2021-11-01 LAB — CUP PACEART REMOTE DEVICE CHECK
Date Time Interrogation Session: 20231009230640
Implantable Pulse Generator Implant Date: 20220926

## 2021-11-02 DIAGNOSIS — N184 Chronic kidney disease, stage 4 (severe): Secondary | ICD-10-CM | POA: Diagnosis not present

## 2021-11-02 DIAGNOSIS — E875 Hyperkalemia: Secondary | ICD-10-CM | POA: Diagnosis not present

## 2021-11-02 DIAGNOSIS — E1021 Type 1 diabetes mellitus with diabetic nephropathy: Secondary | ICD-10-CM | POA: Diagnosis not present

## 2021-11-02 DIAGNOSIS — Z23 Encounter for immunization: Secondary | ICD-10-CM | POA: Diagnosis not present

## 2021-11-02 DIAGNOSIS — I639 Cerebral infarction, unspecified: Secondary | ICD-10-CM | POA: Diagnosis not present

## 2021-11-02 DIAGNOSIS — E7849 Other hyperlipidemia: Secondary | ICD-10-CM | POA: Diagnosis not present

## 2021-11-02 DIAGNOSIS — Z6822 Body mass index (BMI) 22.0-22.9, adult: Secondary | ICD-10-CM | POA: Diagnosis not present

## 2021-11-02 DIAGNOSIS — R030 Elevated blood-pressure reading, without diagnosis of hypertension: Secondary | ICD-10-CM | POA: Diagnosis not present

## 2021-11-02 DIAGNOSIS — E039 Hypothyroidism, unspecified: Secondary | ICD-10-CM | POA: Diagnosis not present

## 2021-11-02 DIAGNOSIS — I10 Essential (primary) hypertension: Secondary | ICD-10-CM | POA: Diagnosis not present

## 2021-11-06 ENCOUNTER — Ambulatory Visit (INDEPENDENT_AMBULATORY_CARE_PROVIDER_SITE_OTHER): Payer: Medicare Other

## 2021-11-06 DIAGNOSIS — I639 Cerebral infarction, unspecified: Secondary | ICD-10-CM

## 2021-11-08 DIAGNOSIS — N189 Chronic kidney disease, unspecified: Secondary | ICD-10-CM | POA: Diagnosis not present

## 2021-11-08 DIAGNOSIS — E038 Other specified hypothyroidism: Secondary | ICD-10-CM | POA: Diagnosis not present

## 2021-11-10 DIAGNOSIS — Z9889 Other specified postprocedural states: Secondary | ICD-10-CM | POA: Insufficient documentation

## 2021-11-10 DIAGNOSIS — I639 Cerebral infarction, unspecified: Secondary | ICD-10-CM | POA: Insufficient documentation

## 2021-11-13 DIAGNOSIS — Z20828 Contact with and (suspected) exposure to other viral communicable diseases: Secondary | ICD-10-CM | POA: Diagnosis not present

## 2021-11-13 DIAGNOSIS — J019 Acute sinusitis, unspecified: Secondary | ICD-10-CM | POA: Diagnosis not present

## 2021-11-13 DIAGNOSIS — R059 Cough, unspecified: Secondary | ICD-10-CM | POA: Diagnosis not present

## 2021-11-13 DIAGNOSIS — Z6822 Body mass index (BMI) 22.0-22.9, adult: Secondary | ICD-10-CM | POA: Diagnosis not present

## 2021-11-13 DIAGNOSIS — R030 Elevated blood-pressure reading, without diagnosis of hypertension: Secondary | ICD-10-CM | POA: Diagnosis not present

## 2021-11-14 ENCOUNTER — Telehealth: Payer: Self-pay | Admitting: Internal Medicine

## 2021-11-14 DIAGNOSIS — Z23 Encounter for immunization: Secondary | ICD-10-CM | POA: Diagnosis not present

## 2021-11-14 NOTE — Telephone Encounter (Signed)
Patient states she has cough but does not have covid and has no fever. She states she is not sure if she can still have her loop recorder removal tomorrow. She requests the call back go to her husband's phone, because she will be out today. Phone: [phone]

## 2021-11-14 NOTE — Telephone Encounter (Signed)
Spoke with pt who complains of cough and sinus symptoms x 1 week.  Pt states she tested negative for covid yesterday and was started on an antibiotic.  Pt wold like to cancel appointment for tomorrow due to concerns of coughing during procedure.   Pt advised we will cancel procedure and will have scheduler contact her to reschedule removal of loop recorder.  Pt verbalizes understanding and agrees with current plan.

## 2021-11-15 ENCOUNTER — Encounter: Payer: Medicare Other | Admitting: Internal Medicine

## 2021-11-15 DIAGNOSIS — I639 Cerebral infarction, unspecified: Secondary | ICD-10-CM

## 2021-11-15 DIAGNOSIS — Z9889 Other specified postprocedural states: Secondary | ICD-10-CM

## 2021-11-17 ENCOUNTER — Other Ambulatory Visit: Payer: Self-pay | Admitting: Cardiology

## 2021-11-23 NOTE — Progress Notes (Signed)
Carelink Summary Report / Loop Recorder 

## 2021-11-28 DIAGNOSIS — D631 Anemia in chronic kidney disease: Secondary | ICD-10-CM | POA: Diagnosis not present

## 2021-11-28 DIAGNOSIS — Z9641 Presence of insulin pump (external) (internal): Secondary | ICD-10-CM | POA: Diagnosis not present

## 2021-11-28 DIAGNOSIS — I2510 Atherosclerotic heart disease of native coronary artery without angina pectoris: Secondary | ICD-10-CM | POA: Diagnosis not present

## 2021-11-28 DIAGNOSIS — R875 Abnormal microbiological findings in specimens from female genital organs: Secondary | ICD-10-CM | POA: Diagnosis not present

## 2021-11-28 DIAGNOSIS — Z951 Presence of aortocoronary bypass graft: Secondary | ICD-10-CM | POA: Diagnosis not present

## 2021-11-28 DIAGNOSIS — Z8673 Personal history of transient ischemic attack (TIA), and cerebral infarction without residual deficits: Secondary | ICD-10-CM | POA: Diagnosis not present

## 2021-11-28 DIAGNOSIS — E1022 Type 1 diabetes mellitus with diabetic chronic kidney disease: Secondary | ICD-10-CM | POA: Diagnosis not present

## 2021-11-28 DIAGNOSIS — E271 Primary adrenocortical insufficiency: Secondary | ICD-10-CM | POA: Diagnosis not present

## 2021-11-28 DIAGNOSIS — I129 Hypertensive chronic kidney disease with stage 1 through stage 4 chronic kidney disease, or unspecified chronic kidney disease: Secondary | ICD-10-CM | POA: Diagnosis not present

## 2021-11-28 DIAGNOSIS — Z794 Long term (current) use of insulin: Secondary | ICD-10-CM | POA: Diagnosis not present

## 2021-11-28 DIAGNOSIS — N1832 Chronic kidney disease, stage 3b: Secondary | ICD-10-CM | POA: Diagnosis not present

## 2021-12-04 ENCOUNTER — Telehealth: Payer: Self-pay | Admitting: Cardiology

## 2021-12-04 DIAGNOSIS — L7211 Pilar cyst: Secondary | ICD-10-CM | POA: Diagnosis not present

## 2021-12-04 DIAGNOSIS — L729 Follicular cyst of the skin and subcutaneous tissue, unspecified: Secondary | ICD-10-CM | POA: Diagnosis not present

## 2021-12-04 MED ORDER — ATORVASTATIN CALCIUM 80 MG PO TABS: 80.0000 mg | ORAL_TABLET | Freq: Every day | ORAL | 1 refills | Status: DC

## 2021-12-04 NOTE — Telephone Encounter (Signed)
Can change back to atorvastatin '80mg'$  daily, d/c crestor  Zandra Abts MD

## 2021-12-04 NOTE — Telephone Encounter (Signed)
Patient informed and verbalized understanding of plan. 

## 2021-12-04 NOTE — Telephone Encounter (Signed)
Pt was seen by her Kidney Dr. Jacolyn Reedy on Monday or Tuesday of last week and he told her that the Crestor she's on is to strong from a kidney stand point and that she needs to go back to taking the Atorvastatin   Rx can be sent to [place] can be reached @ [phone]

## 2021-12-05 DIAGNOSIS — I10 Essential (primary) hypertension: Secondary | ICD-10-CM | POA: Diagnosis not present

## 2021-12-05 DIAGNOSIS — Z8673 Personal history of transient ischemic attack (TIA), and cerebral infarction without residual deficits: Secondary | ICD-10-CM | POA: Diagnosis not present

## 2021-12-05 DIAGNOSIS — E039 Hypothyroidism, unspecified: Secondary | ICD-10-CM | POA: Diagnosis not present

## 2021-12-05 DIAGNOSIS — L7211 Pilar cyst: Secondary | ICD-10-CM | POA: Diagnosis not present

## 2021-12-05 DIAGNOSIS — Z9049 Acquired absence of other specified parts of digestive tract: Secondary | ICD-10-CM | POA: Diagnosis not present

## 2021-12-05 DIAGNOSIS — L720 Epidermal cyst: Secondary | ICD-10-CM | POA: Diagnosis not present

## 2021-12-05 DIAGNOSIS — Z91040 Latex allergy status: Secondary | ICD-10-CM | POA: Diagnosis not present

## 2021-12-05 DIAGNOSIS — N189 Chronic kidney disease, unspecified: Secondary | ICD-10-CM | POA: Diagnosis not present

## 2021-12-05 DIAGNOSIS — Z79890 Hormone replacement therapy (postmenopausal): Secondary | ICD-10-CM | POA: Diagnosis not present

## 2021-12-05 DIAGNOSIS — I2510 Atherosclerotic heart disease of native coronary artery without angina pectoris: Secondary | ICD-10-CM | POA: Diagnosis not present

## 2021-12-05 DIAGNOSIS — E1122 Type 2 diabetes mellitus with diabetic chronic kidney disease: Secondary | ICD-10-CM | POA: Diagnosis not present

## 2021-12-05 DIAGNOSIS — Z79899 Other long term (current) drug therapy: Secondary | ICD-10-CM | POA: Diagnosis not present

## 2021-12-05 DIAGNOSIS — Z7902 Long term (current) use of antithrombotics/antiplatelets: Secondary | ICD-10-CM | POA: Diagnosis not present

## 2021-12-05 DIAGNOSIS — I129 Hypertensive chronic kidney disease with stage 1 through stage 4 chronic kidney disease, or unspecified chronic kidney disease: Secondary | ICD-10-CM | POA: Diagnosis not present

## 2021-12-05 DIAGNOSIS — Z7952 Long term (current) use of systemic steroids: Secondary | ICD-10-CM | POA: Diagnosis not present

## 2021-12-05 DIAGNOSIS — Z888 Allergy status to other drugs, medicaments and biological substances status: Secondary | ICD-10-CM | POA: Diagnosis not present

## 2021-12-05 DIAGNOSIS — Z794 Long term (current) use of insulin: Secondary | ICD-10-CM | POA: Diagnosis not present

## 2021-12-05 DIAGNOSIS — Z951 Presence of aortocoronary bypass graft: Secondary | ICD-10-CM | POA: Diagnosis not present

## 2021-12-05 DIAGNOSIS — Z881 Allergy status to other antibiotic agents status: Secondary | ICD-10-CM | POA: Diagnosis not present

## 2021-12-06 DIAGNOSIS — N1832 Chronic kidney disease, stage 3b: Secondary | ICD-10-CM | POA: Diagnosis not present

## 2021-12-06 DIAGNOSIS — E875 Hyperkalemia: Secondary | ICD-10-CM | POA: Diagnosis not present

## 2021-12-11 ENCOUNTER — Ambulatory Visit (INDEPENDENT_AMBULATORY_CARE_PROVIDER_SITE_OTHER): Payer: Medicare Other

## 2021-12-11 DIAGNOSIS — I639 Cerebral infarction, unspecified: Secondary | ICD-10-CM

## 2021-12-11 LAB — CUP PACEART REMOTE DEVICE CHECK
Date Time Interrogation Session: 20231119230811
Implantable Pulse Generator Implant Date: 20220926

## 2021-12-19 ENCOUNTER — Encounter: Payer: Self-pay | Admitting: Cardiology

## 2021-12-19 ENCOUNTER — Ambulatory Visit: Payer: Medicare Other | Attending: Cardiology | Admitting: Cardiology

## 2021-12-19 ENCOUNTER — Encounter: Payer: Self-pay | Admitting: *Deleted

## 2021-12-19 VITALS — BP 108/60 | HR 74 | Ht 62.0 in | Wt 120.0 lb

## 2021-12-19 DIAGNOSIS — L7211 Pilar cyst: Secondary | ICD-10-CM | POA: Diagnosis not present

## 2021-12-19 DIAGNOSIS — I639 Cerebral infarction, unspecified: Secondary | ICD-10-CM | POA: Diagnosis not present

## 2021-12-19 DIAGNOSIS — I10 Essential (primary) hypertension: Secondary | ICD-10-CM | POA: Diagnosis not present

## 2021-12-19 DIAGNOSIS — E782 Mixed hyperlipidemia: Secondary | ICD-10-CM | POA: Insufficient documentation

## 2021-12-19 DIAGNOSIS — I2510 Atherosclerotic heart disease of native coronary artery without angina pectoris: Secondary | ICD-10-CM | POA: Insufficient documentation

## 2021-12-19 NOTE — Patient Instructions (Signed)
Medication Instructions:  Continue all current medications.   Labwork: none  Testing/Procedures: none  Follow-Up: 6 months   Any Other Special Instructions Will Be Listed Below (If Applicable).   If you need a refill on your cardiac medications before your next appointment, please call your pharmacy.  

## 2021-12-19 NOTE — Progress Notes (Signed)
Clinical Summary Ms. Dement is a 71 y.o.female seen today for follow up of the following medical problems.    1. CAD -  previously followed by [facility]. 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   - LVEF March 2011 reported normal LVEF. Echo 04/2011 LVEF 55-60%. - 12/2014 Lexiscan without ischemia - 08/2016 echo LVEF 60-65%, grade II diastolic dysfunction - 02/5954 LVEF 60-65%, grade I,      -currerntly on plavix per neuro, we have deferred antiplatelet agents to neurology given recurrent CVAs  - no chest pains, no SOB/DOE - compliant with meds   2. PAD - hx of right common femoral to popliteal bypass in [place] - followed by vascular     05/2020 stents to bilateral common iliac arteries   3. Hyperlipidemia - 02/2015 TC 148 TG 38 HDL 104 LDL 36   03/2019 TC 155 TG 63 HDL 87 LDL 55 09/2020 TC 154 TG 55 HDL 76 LDL 67 Jan 2023 TC 173 TG 54 HDL 91 LDL 71   - we had changed to crestor but given renal dysfunction limited on doseing, changed back to atorvastatin '80mg'$  daily   4. 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 Adrenal insufficiency - followed by endocrine     6. CKD - followed at [facility]     7. Chronic diastolic HF - echo 03/8754 LVEF 60-65%, grade II diastolic dysfunction. Mild to moderate RV dysfunction. PASP 23.  - no recent issues with edema, actually on florinef for adrenal insufficiency history.    - no recent edema.    8. CVA - admit 09/2020 with CVA - was discharged with loop recorder - 04/2021 loop recorder check was benign   - she is plavix alone perneuro - plans for loop recorder removal   9. HTN - home bp's 130/60s  - renal held norvasc and started indapamide during 11/28/21 visit - defer further changes to neprhology.    Past Medical History:  Diagnosis Date   Cancer ([place])    Squamous Cell   Diabetes mellitus without complication ([place])    Hyperlipemia    Hypertension     Hypothyroidism    PAD (peripheral artery disease) ([facility])    Stroke ([facility])      Allergies  Allergen Reactions   Bactrim [Sulfamethoxazole-Trimethoprim] Other (See Comments)    Elevates potassium.    Tape Other (See Comments)    not allergic but causes sensitivity.   Fluorescein Rash   Procrit [Epoetin (Alfa)] Rash     Current Outpatient Medications  Medication Sig Dispense Refill   acetaminophen (TYLENOL) 500 MG tablet Take 500 mg by mouth every 6 (six) hours as needed for mild pain or headache.      amLODipine (NORVASC) 5 MG tablet Take 2.5 mg by mouth daily.     Ascorbic Acid (VITAMIN C WITH ROSE HIPS) 500 MG tablet Take 500 mg by mouth at bedtime.     atorvastatin (LIPITOR) 80 MG tablet Take 1 tablet (80 mg total) by mouth daily. 90 tablet 1   Calcium Carbonate (CALCIUM 600 PO) Take 600 mg by mouth in the morning and at bedtime.     Cholecalciferol (VITAMIN D3) 50 MCG (2000 UT) TABS Take 2,000 Units by mouth daily.     clopidogrel (PLAVIX) 75 MG tablet Take 1 tablet (75 mg total) by mouth daily. 90 tablet 3   fludrocortisone (FLORINEF) 0.1 MG tablet  Take 0.05 mg by mouth daily.     GLUCAGEN HYPOKIT 1 MG SOLR injection Inject into the muscle.     HUMALOG 100 UNIT/ML injection Inject into the skin See admin instructions. VIA INSULIN PUMP     Insulin Human (INSULIN PUMP) SOLN Inject into the skin. HUMALOG 100 UNIT/ML     iron polysaccharides (NIFEREX) 150 MG capsule Take 150 mg by mouth at bedtime.      levothyroxine (SYNTHROID) 112 MCG tablet Take 112 mcg by mouth daily.     magnesium chloride (SLOW-MAG) 64 MG TBEC SR tablet Take 1-2 tablets by mouth See admin instructions. TAKE 1 TABLET IN THE MORNING & TAKE 2 TABLETS AT NIGHT.     Multiple Vitamin (MULTIVITAMIN WITH MINERALS) TABS tablet Take 1 tablet by mouth daily.     Multiple Vitamins-Minerals (PRESERVISION AREDS 2 PO) Take 1 tablet by mouth daily.      Omega-3 Fatty Acids (FISH OIL) 1200 MG CAPS Take 1,200 mg by mouth daily.      predniSONE (DELTASONE) 5 MG tablet Take 5 mg by mouth daily with breakfast.     sodium bicarbonate 650 MG tablet Take 650 mg by mouth daily.     VELTASSA 8.4 g packet Take 8.4 g by mouth once a week.     No current facility-administered medications for this visit.     Past Surgical History:  Procedure Laterality Date   ABDOMINAL AORTOGRAM W/LOWER EXTREMITY N/A 06/09/2019   Procedure: ABDOMINAL AORTOGRAM W/LOWER EXTREMITY;  Surgeon: Serafina Mitchell, MD;  Location: [place];  Service: Cardiovascular;  Laterality: N/A;   APPENDECTOMY     CATARACT EXTRACTION Bilateral    CORONARY ARTERY BYPASS GRAFT     LOOP RECORDER INSERTION N/A 10/17/2020   Procedure: LOOP RECORDER INSERTION;  Surgeon: Deboraha Sprang, MD;  Location: [place] [facility];  Service: Cardiovascular;  Laterality: N/A;   PERIPHERAL VASCULAR INTERVENTION Bilateral 06/09/2019   Procedure: PERIPHERAL VASCULAR INTERVENTION;  Surgeon: Serafina Mitchell, MD;  Location: [facility];  Service: Cardiovascular;  Laterality: Bilateral;   TONSILLECTOMY     YAG LASER APPLICATION Right 3/61/4431   Procedure: YAG LASER APPLICATION;  Surgeon: Williams Che, MD;  Location: [facility];  Service: Ophthalmology;  Laterality: Right;     Allergies  Allergen Reactions   Bactrim [Sulfamethoxazole-Trimethoprim] Other (See Comments)    Elevates potassium.    Tape Other (See Comments)    not allergic but causes sensitivity.   Fluorescein Rash   Procrit [Epoetin (Alfa)] Rash      Family History  Problem Relation Age of Onset   Heart disease Mother    Hypertension Mother    Hypertension Father    Sleep apnea Neg Hx      Social History Ms. Mcgillivray reports that she has never smoked. She has never used smokeless tobacco. Ms. Germer reports no history of alcohol use.   Review of Systems CONSTITUTIONAL: No weight loss, fever, chills, weakness or fatigue.  HEENT: Eyes: No visual loss, blurred vision, double vision or yellow  sclerae.No hearing loss, sneezing, congestion, runny nose or sore throat.  SKIN: No rash or itching.  CARDIOVASCULAR: per hpi RESPIRATORY: No shortness of breath, cough or sputum.  GASTROINTESTINAL: No anorexia, nausea, vomiting or diarrhea. No abdominal pain or blood.  GENITOURINARY: No burning on urination, no polyuria NEUROLOGICAL: No headache, dizziness, syncope, paralysis, ataxia, numbness or tingling in the extremities. No change in bowel or bladder control.  MUSCULOSKELETAL: No muscle, back  pain, joint pain or stiffness.  LYMPHATICS: No enlarged nodes. No history of splenectomy.  PSYCHIATRIC: No history of depression or anxiety.  ENDOCRINOLOGIC: No reports of sweating, cold or heat intolerance. No polyuria or polydipsia.  Marland Kitchen   Physical Examination Today's Vitals   12/19/21 1343  BP: 108/60  Pulse: 74  SpO2: 97%  Weight: 120 lb (54.4 kg)  Height: '5\' 2"'$  (1.575 m)   Body mass index is 21.95 kg/m.  Gen: resting comfortably, no acute distress HEENT: no scleral icterus, pupils equal round and reactive, no palptable cervical adenopathy,  CV: RRR, 2/6 systolic mrumru rusb, no jvd Resp: Clear to auscultation bilaterally GI: abdomen is soft, non-tender, non-distended, normal bowel sounds, no hepatosplenomegaly MSK: extremities are warm, no edema.  Skin: warm, no rash Neuro:  no focal deficits Psych: appropriate affect   Diagnostic Studies  04/2011 Echo FINDINGS:  LEFT VENTRICLE The left ventricular size is normal. There is normal left ventricular wall  thickness. Left ventricular systolic function is normal. LV ejection fraction  = 55-60%. Left ventricular filling pattern is indeterminate. The left  ventricular wall motion is normal. LV WALL MOTION -  RIGHT VENTRICLE The right ventricle is normal in size and function. The right ventricular  systolic function is normal. The right ventricular wall motion is normal.  LEFT ATRIUM The left atrium is borderline  dilated.  RIGHT ATRIUM  Right atrial size is normal. - AORTIC VALVE The aortic valve is normal in structure and function. The aortic valve opens  well. Trace (trivial) aortic regurgitation. - MITRAL VALVE The mitral valve leaflets appear normal. There is no evidence of stenosis,  fluttering, or prolapse. There is mild mitral regurgitation. - TRICUSPID VALVE Structurally normal tricuspid valve. There is mild tricuspid regurgitation. - PULMONIC VALVE The pulmonic valve is normal in structure and function. Trace pulmonic  valvular regurgitation. - ARTERIES The aortic root is normal size. - VENOUS Pulmonary venous flow pattern is blunted. IVC size was normal. - EFFUSION There is no pericardial effusion. - - MMode/2D Measurements & Calculations IVSd: 0.95 cm LVIDd: 3.9 cm LVPWd: 0.99 cm LVIDs: 2.4 cm LA dim: 3.8 cm Ao root: 2.5 cm EDV(MOD-sp4): 24.0 ml ESV(MOD-sp4): 11.0 ml LVOT diam: 1.5 cm LAV(MOD-sp4): 41.5 ml Doppler Measurements & Calculations MV E max vel: 114.0 cm/sec MV A max vel: 82.4 cm/sec MV E/A: 1.4 Med Peak E' Vel: 7.6 cm/sec Lat Peak E' Vel: 12.2 cm/sec E/Lat E`: 9.4 E/Med E`: 15.0 MV dec time: 0.16 sec SV(LVOT): 45.4 ml Ao mean PG: 7.1 mmHg AVA (VTI): 1.3 cm2 LV V1 VTI: 27.2 cm TR max vel: 230.8 cm/sec TR max PG: 21.3 mmHg RVSP(TR): 26.3 mmHg RAP systole: 5.0 mmHg    11/2014 echo Study Conclusions  - Left ventricle: The cavity size was normal. Systolic function was   normal. The estimated ejection fraction was in the range of 60%   to 65%. Wall motion was normal; there were no regional wall   motion abnormalities. Diastolic dysfunction, grade indeterminate.   Medial annular velocity is slightly low with elevated E/e&' ratio   of 18, indicative of elevated filling pressures. Mild to moderate   concentric left ventricular hypertrophy. - Aortic valve: Moderately calcified annulus. Trileaflet. There was   mild regurgitation. - Mitral  valve: Mildly calcified annulus. Mildly thickened leaflets   . There was trivial regurgitation. - Right ventricle: Systolic function was normal. TAPSE: 17.6 mm . - Tricuspid valve: There was mild-moderate regurgitation. - Pulmonary arteries: Systolic pressure was mildly increased. PA  peak pressure: 35 mm Hg (S).     12/2014 Lexiscan MPI No diagnostic ST segment abnormalities. Rare PACs. Very small fixed perfusion defect at the inferior apex most consistent with attenuation artifact rather than scar. No ischemic findings. This is a low risk study. Nuclear stress EF: 70%.   08/2016 echo Study Conclusions   - Left ventricle: The cavity size was normal. Wall thickness was   normal. Systolic function was normal. The estimated ejection   fraction was in the range of 60% to 65%. Features are consistent   with a pseudonormal left ventricular filling pattern, with   concomitant abnormal relaxation and increased filling pressure   (grade 2 diastolic dysfunction). Doppler parameters are   consistent with high ventricular filling pressure. - Aortic valve: Moderately calcified annulus. Trileaflet. There was   mild regurgitation. - Mitral valve: Calcified annulus. There was mild regurgitation. - Right ventricle: Systolic function was mildly to moderately   reduced.     09/2020 echo IMPRESSIONS     1. Left ventricular ejection fraction, by estimation, is 60 to 65%. The  left ventricle has normal function. The left ventricle has no regional  wall motion abnormalities. Left ventricular diastolic parameters are  consistent with Grade I diastolic  dysfunction (impaired relaxation).   2. Right ventricular systolic function is normal. The right ventricular  size is normal. There is normal pulmonary artery systolic pressure.   3. The mitral valve is normal in structure. Trivial mitral valve  regurgitation. No evidence of mitral stenosis.   4. The aortic valve is calcified. There is moderate  calcification of the  aortic valve. There is moderate thickening of the aortic valve. Aortic  valve regurgitation is trivial. Mild to moderate aortic valve  sclerosis/calcification is present, without any   evidence of aortic stenosis.   5. The inferior vena cava is normal in size with greater than 50%  respiratory variability, suggesting right atrial pressure of 3 mmHg.      Assessment and Plan  1. CAD - no ACE/ARB due to renal dysfunction - . On plavix due to recurrent CVAs, fine from cardiac standpoint as well - no symptoms, continue current meds     2. Hyperlipidemia -very high risk given CAD, PAD, multiple CVAs and , thus LDL goal would be <55 -had to d/c high dose crestor due to renal function, back on atorva '80mg'$ . Request labs from pcp, may consider zetia in the near future.    3. HTN -at goal, recent changes by neprhology. - continue current meds      Arnoldo Lenis, M.D.

## 2021-12-29 ENCOUNTER — Ambulatory Visit: Payer: Medicare Other | Attending: Internal Medicine | Admitting: Internal Medicine

## 2021-12-29 ENCOUNTER — Other Ambulatory Visit: Payer: Self-pay | Admitting: *Deleted

## 2021-12-29 VITALS — BP 124/68 | HR 96 | Ht 62.0 in | Wt 118.4 lb

## 2021-12-29 DIAGNOSIS — Z95818 Presence of other cardiac implants and grafts: Secondary | ICD-10-CM | POA: Diagnosis not present

## 2021-12-29 DIAGNOSIS — I779 Disorder of arteries and arterioles, unspecified: Secondary | ICD-10-CM

## 2021-12-29 DIAGNOSIS — Z95828 Presence of other vascular implants and grafts: Secondary | ICD-10-CM

## 2021-12-29 DIAGNOSIS — I70213 Atherosclerosis of native arteries of extremities with intermittent claudication, bilateral legs: Secondary | ICD-10-CM

## 2021-12-29 NOTE — Patient Instructions (Addendum)
Medication Instructions:  Your physician recommends that you continue on your current medications as directed. Please refer to the Current Medication list given to you today.  *If you need a refill on your cardiac medications before your next appointment, please call your pharmacy*   Lab Work: None ordered.  If you have labs (blood work) drawn today and your tests are completely normal, you will receive your results only by: [place] (if you have MyChart) OR A paper copy in the mail If you have any lab test that is abnormal or we need to change your treatment, we will call you to review the results.   Testing/Procedures: Removal of Loop Recorder    Follow-Up: At [facility], you and your health needs are our priority.  As part of our continuing mission to provide you with exceptional heart care, we have created designated Provider Care Teams.  These Care Teams include your primary Cardiologist (physician) and Advanced Practice Providers (APPs -  Physician Assistants and Nurse Practitioners) who all work together to provide you with the care you need, when you need it.  We recommend signing up for the patient portal called "MyChart".  Sign up information is provided on this After Visit Summary.  MyChart is used to connect with patients for Virtual Visits (Telemedicine).  Patients are able to view lab/test results, encounter notes, upcoming appointments, etc.  Non-urgent messages can be sent to your provider as well.   To learn more about what you can do with MyChart, go to [email or web address].    Your next appointment:   02/12/2022 at 830am with Dr Caryl Comes for loop explant  Important Information About Sugar

## 2021-12-29 NOTE — Progress Notes (Signed)
Patient Care Team: Curlene Labrum, MD as PCP - [place], Alphonse Guild, MD as PCP - Cardiology (Cardiology) Deboraha Sprang, MD as PCP - Electrophysiology (Cardiology) Shea Stakes, MD (Internal Medicine)   HPI  Kendra Walker is a 71 y.o. female Seen in follow-up for a loop recorder implanted 9/22 for cryptogenic stroke occurring in the context of coronary disease with prior bypass diabetes hyperlipidemia peripheral vascular disease and thyroiditis.  And adrenal insufficiency.  Here with complaints of pain related to her event recorder.  She saw AT 9/23 who noted about 1 cm inferolateral migration.  The pain is triggered by movement in bed, other various movements, can be aggravated by palpation and sometimes occurs with no movement at all it localizes to her loop recorder DATE TEST EF   9/22 TTE  60-65 %               Date Cr K Hgb  11/23 1.64 5.1 13.0    Records and Results Reviewed1   Past Medical History:  Diagnosis Date   Cancer ([place])    Squamous Cell   Diabetes mellitus without complication ([place])    Hyperlipemia    Hypertension    Hypothyroidism    PAD (peripheral artery disease) ([place])    Stroke [facility])     Past Surgical History:  Procedure Laterality Date   ABDOMINAL AORTOGRAM W/LOWER EXTREMITY N/A 06/09/2019   Procedure: ABDOMINAL AORTOGRAM W/LOWER EXTREMITY;  Surgeon: Serafina Mitchell, MD;  Location: [facility];  Service: Cardiovascular;  Laterality: N/A;   APPENDECTOMY     CATARACT EXTRACTION Bilateral    CORONARY ARTERY BYPASS GRAFT     LOOP RECORDER INSERTION N/A 10/17/2020   Procedure: LOOP RECORDER INSERTION;  Surgeon: Deboraha Sprang, MD;  Location: [place] [facility];  Service: Cardiovascular;  Laterality: N/A;   PERIPHERAL VASCULAR INTERVENTION Bilateral 06/09/2019   Procedure: PERIPHERAL VASCULAR INTERVENTION;  Surgeon: Serafina Mitchell, MD;  Location: [facility];  Service: Cardiovascular;  Laterality: Bilateral;    TONSILLECTOMY     YAG LASER APPLICATION Right 9/38/1017   Procedure: YAG LASER APPLICATION;  Surgeon: Williams Che, MD;  Location: [facility];  Service: Ophthalmology;  Laterality: Right;    Current Meds  Medication Sig   acetaminophen (TYLENOL) 500 MG tablet Take 500 mg by mouth every 6 (six) hours as needed for mild pain or headache.    ascorbic Acid (VITAMIN C) 500 MG CPCR Take 500 mg by mouth daily.   atorvastatin (LIPITOR) 80 MG tablet Take 1 tablet (80 mg total) by mouth daily.   Calcium Carbonate (CALCIUM 600 PO) Take 600 mg by mouth in the morning and at bedtime.   Cholecalciferol (VITAMIN D3) 50 MCG (2000 UT) TABS Take 2,000 Units by mouth daily.   clopidogrel (PLAVIX) 75 MG tablet Take 1 tablet (75 mg total) by mouth daily.   fludrocortisone (FLORINEF) 0.1 MG tablet Take 0.05 mg by mouth daily.   HUMALOG 100 UNIT/ML injection Inject into the skin See admin instructions. VIA INSULIN PUMP   indapamide (LOZOL) 1.25 MG tablet Take by mouth.   Insulin Human (INSULIN PUMP) SOLN Inject into the skin. HUMALOG 100 UNIT/ML   iron polysaccharides (NIFEREX) 150 MG capsule Take 150 mg by mouth at bedtime.    levothyroxine (SYNTHROID) 112 MCG tablet Take 112 mcg by mouth daily.   magnesium chloride (SLOW-MAG) 64 MG TBEC SR tablet Take 1-2 tablets by mouth See admin  instructions. TAKE 1 TABLET IN THE MORNING & TAKE 2 TABLETS AT NIGHT.   Multiple Vitamin (MULTIVITAMIN WITH MINERALS) TABS tablet Take 1 tablet by mouth daily.   Multiple Vitamins-Minerals (PRESERVISION AREDS 2 PO) Take 1 tablet by mouth daily.    Omega-3 Fatty Acids (FISH OIL) 1200 MG CAPS Take 1,200 mg by mouth daily.   predniSONE (DELTASONE) 5 MG tablet Take 5 mg by mouth daily with breakfast.   VELTASSA 8.4 g packet Take 8.4 g by mouth once a week.   [DISCONTINUED] amLODipine (NORVASC) 5 MG tablet Take 2.5 mg by mouth daily.   [DISCONTINUED] sodium bicarbonate 650 MG tablet Take 650 mg by mouth daily.    Allergies  Allergen  Reactions   Bactrim [Sulfamethoxazole-Trimethoprim] Other (See Comments)    Elevates potassium.    Tape Other (See Comments)    not allergic but causes sensitivity.   Fluorescein Rash   Procrit [Epoetin (Alfa)] Rash      Review of Systems negative except from HPI and PMH  Physical Exam BP 124/68   Pulse 96   Ht '5\' 2"'$  (1.575 m)   Wt 118 lb 6.4 oz (53.7 kg)   SpO2 99%   BMI 21.66 kg/m  Well developed and well nourished in no acute distress HENT normal E scleral and icterus clear Neck Supple JVP flat; carotids brisk and full Clear to ausculation Significant tenderness over the loop recorder.  No erythema.   regular rate and rhythm, no murmurs gallops or rub Soft with active bowel sounds No clubbing cyanosis  Edema Alert and oriented, grossly normal motor and sensory function Skin Warm and Dry  ECG from 12/19/2021 demonstrates sinus rhythm with freq PACs at 74 All 14/08/36  CrCl cannot be calculated (Patient's most recent lab result is older than the maximum 21 days allowed.).   Assessment and  Plan  Cryptogenic stroke  Implantable loop recorder  Chest pain intermittent localizing to her implantable loop recorder   The patient has complaints of significant discomfort localized in general to the facility of her loop recorder.  It is tender to palpation.  I am not altogether sanguine that removing the loop recorder will mitigate all of her discomfort; however, there are lots of things pointing at it could be the loop recorder.  Hence, we will remove it.  Doubt infection  Current medicines are reviewed at length with the patient today .  The patient does not  have concerns regarding medicines.

## 2022-01-02 NOTE — Progress Notes (Unsigned)
[facility] [address] [place]. [place] [address] [phone]       STROKE FOLLOW UP NOTE  [name] Date of Birth:  12-13-1950 Medical Record Number:  [id number]   Reason for Referral: stroke follow up    SUBJECTIVE:   CHIEF COMPLAINT:  No chief complaint on file.   HPI:   Update 01/03/2022 JM: Patient returns for 107-monthstroke follow-up.  Overall stable without new stroke/TIA symptoms.  Reports continued ***.   Compliant on Plavix and Crestor Blood pressure *** Plans on removal of loop recorder next month due to discomfort Continues to follow with PCP, cardiology, vascular surgery and nephrology  Completed sleep study 09/2021 which did not show evidence of sleep apnea or hypoxemia    History provided for reference purposes only Update 07/18/2021 JM: Patient returns for stroke follow-up after prior acute visit 5 months ago for worsening right hand numbness/weakness and 1 minute episode of left arm flaccid 2/10.  Completed MRI 2/13 which showed acute/subacute left parietal stroke.  MRA head stable compared to 09/2020 imaging and carotid duplex no evidence of stenosis.  Recommended DAPT for 3 weeks then Plavix alone as on aspirin prior and continuation of atorvastatin 80 mg daily.  Cervical imaging also obtained 2/13 without significant concerning findings.  Loop recorder has not shown atrial fibrillation thus far.  Since that time, reports improvement of right arm symptoms She has been having difficulty finding the correct words at times. Does not limit activity or functioning but more frustrating. Does admit to some memory loss as well. Reports symptoms have been present since 09/2020. Denies any noticeable worsening recently. No new stroke/TIA symptoms  Husband does mention issues with daytime fatigue. Does admit to snoring, frontal headaches, insomnia and occasional nocturia.  She has not previously underwent sleep study.  Does report recent  adjustments to Synthroid and questions if this is contributing to fatigue. She also has chronic cervical pain and feels this is contributing to her insomnia and headaches  Remains on Plavix and Crestor, denies side effects -recently switched from atorvastatin to Crestor by cardiology Blood pressure today 140/61  She questions safety of removal of cyst on her head around September. Currently waiting in setting of recent stroke.   No further concerns at this time    [place]/13/2023 IMPRESSION: 1. Punctate focus of restricted diffusion consistent with acute/subacute infarct in the left parietal region superimposed on area of chronic infarct. 2. Remote small left frontal and parietal infarcts.   MR CERVICAL 03/06/2021 IMPRESSION: 1. Degenerative changes of the cervical spine resulting in mild-to-moderate spinal canal stenosis at C4-5 and mild spinal canal stenosis at C3-4 and C5-6. 2. Moderate right and mild-to-moderate left neural foraminal narrowing at C4-5. 3. Moderate right and mild left neural foraminal narrowing at C3-4.   MRI HEAD 03/20/2021 IMPRESSION: 1. No new finding when compared to 10/15/2020 MRA and when accounting for motion artifact. 2. Atherosclerosis most notably affecting the carotid siphons with bilateral paraclinoid ICA stenosis which was moderate severity on prior.   CAROTID DUPLEX 04/13/2021 Summary:  Right Carotid: There is no evidence of stenosis in the right ICA.  Left Carotid: There is no evidence of stenosis in the left ICA.  Vertebrals:  Bilateral vertebral arteries demonstrate antegrade flow.  Subclavians: Normal flow hemodynamics were seen in bilateral subclavian arteries.    Update 02/09/2021 JM: patient being seen for acute visit for c/o worsening right hand numbness.  Accompanied by her husband.  Recently  seen 2 months ago with complaints of intermittent right hand tingling since her stroke back in September. Reports over the  past few weeks, has been experiencing more frequent episodes hand numbness but upon further discussion, more so weakness over numbness. This can last from 5-20 minutes. Will have difficulty holding objects or functioning with right hand. Denies any pain. No specific trigger identified. Symptoms do not pass elbow. Effects entire hand. No symptoms of RLE or any other associated neurological symptoms.  Does have chronic cervical pain, gradually worsening. Also reports on Sunday, episode of lightheadedness and increased fatigue associated with hand symptoms lasting approximately 20 minutes. Did not check BP or BG at that time. Does monitor BP otherwise routinely at home and on lower side since starting amlodipine last month (typically SBP 100-110). Does monitor BG routinely and denies any large fluctuation and levels have been stable.  Denies any hypoglycemia.  Compliant on aspirin and atorvastatin.  Recorder has not shown atrial fibrillation thus far.  No further concerns.  Initial visit 12/12/2020 JM: Kendra Walker is being seen for initial hospital follow-up accompanied by her husband, Kendra Walker.  Doing well since discharge without new stroke/TIA symptoms.  Reports occasional right hand tingling present since her stroke but does not interfere with any activity or functioning.  Denies any weakness.    Completed 3 weeks DAPT -remains on aspirin alone and atorvastatin -denies side effects Blood pressure today 160/70 - occasionally monitors at home and normally SBP<130s.  Glucose levels have been good per pt - follows with endocrinology  Loop recorder has not shown atrial fibrillation thus far  No further concerns at this time  Stroke admission 10/14/2020 [name] is a 71 y.o. female with history of HLD, HTN, DM2, and PAD who presented on 10/14/2020 with Right UE weakness.  Personally reviewed hospitalization pertinent progress notes, lab work and pertinent imaging.  Evaluated by Dr. Leonie Man for small left  cortical infarct, embolic secondary to unclear source. MRI also showed prior stroke on imaging. Loop recorder placed 9/26.  MRA head/neck bilateral ICA siphon atherosclerosis and moderate left ICA supraclinoid stenosis.  Carotid Doppler b/l 1 to 39% ICA stenosis.  EF 60 to 65% without cardiac source of embolus identified.  LDL 67.  A1c 8.0.  Recommended DAPT for 3 weeks and aspirin alone as well as resumed atorvastatin 80 mg daily.  PT/OT eval -no therapy needs.     PERTINENT IMAGING  Per recent hospitalization 10/14/2020 CTH no acute normality MRI brain Punctate acute cortical infarct within the posterior left frontal lobe (motor strip).  Small chronic cortical infarcts within the left parietal occipital lobes and mild generalized cerebral atrophy MRA bilateral ICA siphon atherosclerosis and moderate left ICA supraclinoid stenosis. Carotid Doppler no significant bilateral extracranial carotid stenosis.   2D Echo - EF 60 - 65%. No cardiac source of emboli identified.  S/p loop recorder 9/26 LDL 67 HgbA1c 8.0    ROS:   14 system review of systems performed and negative with exception of those listed in HPI  PMH:  Past Medical History:  Diagnosis Date   Cancer ([place])    Squamous Cell   Diabetes mellitus without complication ([place])    Hyperlipemia    Hypertension    Hypothyroidism    PAD (peripheral artery disease) ([place])    Stroke ([place])     PSH:  Past Surgical History:  Procedure Laterality Date   ABDOMINAL AORTOGRAM W/LOWER EXTREMITY N/A 06/09/2019   Procedure: ABDOMINAL AORTOGRAM W/LOWER EXTREMITY;  Surgeon:  Serafina Mitchell, MD;  Location: [facility];  Service: Cardiovascular;  Laterality: N/A;   APPENDECTOMY     CATARACT EXTRACTION Bilateral    CORONARY ARTERY BYPASS GRAFT     LOOP RECORDER INSERTION N/A 10/17/2020   Procedure: LOOP RECORDER INSERTION;  Surgeon: Deboraha Sprang, MD;  Location: [facility];  Service: Cardiovascular;  Laterality: N/A;    PERIPHERAL VASCULAR INTERVENTION Bilateral 06/09/2019   Procedure: PERIPHERAL VASCULAR INTERVENTION;  Surgeon: Serafina Mitchell, MD;  Location: [facility];  Service: Cardiovascular;  Laterality: Bilateral;   TONSILLECTOMY     YAG LASER APPLICATION Right 2/42/6834   Procedure: YAG LASER APPLICATION;  Surgeon: Williams Che, MD;  Location: [facility];  Service: Ophthalmology;  Laterality: Right;    Social History:  Social History   Socioeconomic History   Marital status: Married    Spouse name: Kendra Walker   Number of children: Not on file   Years of education: Not on file   Highest education level: Not on file  Occupational History   Not on file  Tobacco Use   Smoking status: Never   Smokeless tobacco: Never  Vaping Use   Vaping Use: Never used  Substance and Sexual Activity   Alcohol use: No    Alcohol/week: 0.0 standard drinks of alcohol   Drug use: No   Sexual activity: Not on file  Other Topics Concern   Not on file  Social History Narrative   Lives with husband   Social Determinants of Health   Financial Resource Strain: Not on file  Food Insecurity: Not on file  Transportation Needs: Not on file  Physical Activity: Not on file  Stress: Not on file  Social Connections: Not on file  Intimate Partner Violence: Not on file    Family History:  Family History  Problem Relation Age of Onset   Heart disease Mother    Hypertension Mother    Hypertension Father    Sleep apnea Neg Hx     Medications:   Current Outpatient Medications on File Prior to Visit  Medication Sig Dispense Refill   acetaminophen (TYLENOL) 500 MG tablet Take 500 mg by mouth every 6 (six) hours as needed for mild pain or headache.      Ascorbic Acid (VITAMIN C WITH ROSE HIPS) 500 MG tablet Take 500 mg by mouth at bedtime.     ascorbic Acid (VITAMIN C) 500 MG CPCR Take 500 mg by mouth daily.     atorvastatin (LIPITOR) 80 MG tablet Take 1 tablet (80 mg total) by mouth daily. 90 tablet 1    Calcium Carbonate (CALCIUM 600 PO) Take 600 mg by mouth in the morning and at bedtime.     Cholecalciferol (VITAMIN D3) 50 MCG (2000 UT) TABS Take 2,000 Units by mouth daily.     clopidogrel (PLAVIX) 75 MG tablet Take 1 tablet (75 mg total) by mouth daily. 90 tablet 3   fludrocortisone (FLORINEF) 0.1 MG tablet Take 0.05 mg by mouth daily.     HUMALOG 100 UNIT/ML injection Inject into the skin See admin instructions. VIA INSULIN PUMP     indapamide (LOZOL) 1.25 MG tablet Take by mouth.     Insulin Human (INSULIN PUMP) SOLN Inject into the skin. HUMALOG 100 UNIT/ML     iron polysaccharides (NIFEREX) 150 MG capsule Take 150 mg by mouth at bedtime.      levothyroxine (SYNTHROID) 112 MCG tablet Take 112 mcg by mouth daily.  magnesium chloride (SLOW-MAG) 64 MG TBEC SR tablet Take 1-2 tablets by mouth See admin instructions. TAKE 1 TABLET IN THE MORNING & TAKE 2 TABLETS AT NIGHT.     Multiple Vitamin (MULTIVITAMIN WITH MINERALS) TABS tablet Take 1 tablet by mouth daily.     Multiple Vitamins-Minerals (PRESERVISION AREDS 2 PO) Take 1 tablet by mouth daily.      Omega-3 Fatty Acids (FISH OIL) 1200 MG CAPS Take 1,200 mg by mouth daily.     predniSONE (DELTASONE) 5 MG tablet Take 5 mg by mouth daily with breakfast.     VELTASSA 8.4 g packet Take 8.4 g by mouth once a week.     No current facility-administered medications on file prior to visit.    Allergies:   Allergies  Allergen Reactions   Bactrim [Sulfamethoxazole-Trimethoprim] Other (See Comments)    Elevates potassium.    Tape Other (See Comments)    not allergic but causes sensitivity.   Fluorescein Rash   Procrit [Epoetin (Alfa)] Rash      OBJECTIVE:  Physical Exam  There were no vitals filed for this visit.   There is no height or weight on file to calculate BMI. No results found.  General: well developed, well nourished, very pleasant elderly Caucasian female, seated, in no evident distress Head: head normocephalic and  atraumatic.   Neck: supple with no carotid or supraclavicular bruits Cardiovascular: regular rate and rhythm, no murmurs Musculoskeletal: no deformity Skin:  no rash/petichiae Vascular:  Normal pulses all extremities   Neurologic Exam Mental Status: Awake and fully alert.  Occasional speech hesitancy but unable to fully appreciate aphasia or dysarthria during visit.  Oriented to place and time. Recent memory mildly impaired and remote memory intact. Attention span, concentration and fund of knowledge appropriate during visit. Mood and affect appropriate.  Cranial Nerves: Pupils equal, briskly reactive to light. Extraocular movements full without nystagmus. Visual fields full to confrontation. Hearing intact. Facial sensation intact. Face, tongue, palate moves normally and symmetrically.  Motor: Normal bulk and tone. Normal strength in all tested extremity muscles except slightly decreased right hand grip strength and finger dexterity Sensory.: intact to touch , pinprick , position and vibratory sensation  Coordination: Rapid alternating movements normal in all extremities except decreased right hand. Finger-to-nose slight RUE incoordination and heel-to-shin performed accurately bilaterally. Gait and Station: Arises from chair without difficulty. Stance is normal. Gait demonstrates normal stride length and mild imbalance (chronic) with use of cane. Tandem walk and heel toe not attempted Reflexes: 1+ and symmetric. Toes downgoing.        ASSESSMENT: EUFELIA VENO is a 71 y.o. year old female with acute/subacute infarct to the left parietal region 03/06/2021.  Hx of  Small left cortical infarct on 02/10/4172 likely embolic secondary to unclear source s/p ILR. Vascular risk factors include prior stroke on imaging, HTN, HLD, DM type 1 and PAD.  Completed sleep study 09/2021 which was negative for sleep apnea    PLAN:  Left parietal stroke  Transient right arm weakness/numbness: MR brain  03/06/2021 acute/subacute infarct in the left parietal region Recommended DAPT for 3 weeks and Plavix alone as previously on aspirin Continue Plavix and Crestor (recently changed by cardiology) for secondary stroke prevention measures LDL 71 (01/2021), A1c 7.4 (03/2021) MRA 03/20/2021 stable appearance compared to 09/2020 MRA with arthrosclerosis most notably affecting carotid siphons with bilateral paraclinoid ICA stenosis Carotid ultrasound 04/13/2021 no evidence of stenosis bilaterally Present after stroke but worsening in frequency since mid January with  1 minute transient right arm flaccid on 2/10.  On 1/19 exam, evidence of hand and dexterity weakness, RUE ataxia and sensory deficit not previously noted.  Hx of Cryptogenic stroke:  Residual deficit: Mild RUE weakness and word finding difficulty mild memory loss (?very mild after stroke in 09/2020 and possibly worsened after 02/2021 stroke).  Referral placed to [facility]  Loop recorder has not shown atrial fibrillation thus far Discussed secondary stroke prevention measures and importance of close PCP follow up for aggressive stroke risk factor management including BP goal<130/90, HLD with LDL goal<70 and DM with A1c.<7.  I have gone over the pathophysiology of stroke, warning signs and symptoms, risk factors and their management in some detail with instructions to go to the closest emergency room for symptoms of concern.     Follow-up in 6 months or call earlier if needed   CC:  PCP: Burdine, Virgina Evener, MD    I spent 42 minutes of face-to-face and non-face-to-face time with patient and husband.  This included previsit chart review, lab review, study review, order entry, electronic health record documentation, patient and husband education and discussion regarding above diagnoses and information as documented in A/P and answered all other questions to patient and husband satisfaction   Frann Rider, AGNP-BC  [facility] [address] [place], [place] [address]  Phone [phone] Fax [phone] Note: This document was prepared with digital dictation and possible smart phrase technology. Any transcriptional errors that result from this process are unintentional.

## 2022-01-03 ENCOUNTER — Encounter: Payer: Self-pay | Admitting: Adult Health

## 2022-01-03 ENCOUNTER — Ambulatory Visit (INDEPENDENT_AMBULATORY_CARE_PROVIDER_SITE_OTHER): Payer: Medicare Other | Admitting: Adult Health

## 2022-01-03 VITALS — BP 118/57 | HR 77 | Ht 61.0 in | Wt 121.0 lb

## 2022-01-03 DIAGNOSIS — I639 Cerebral infarction, unspecified: Secondary | ICD-10-CM

## 2022-01-03 DIAGNOSIS — I69328 Other speech and language deficits following cerebral infarction: Secondary | ICD-10-CM | POA: Diagnosis not present

## 2022-01-03 DIAGNOSIS — R29898 Other symptoms and signs involving the musculoskeletal system: Secondary | ICD-10-CM | POA: Diagnosis not present

## 2022-01-03 NOTE — Patient Instructions (Signed)
Continue clopidogrel 75 mg daily  and Crestor for secondary stroke prevention  Please ensure your B12 level has been checked recently as low B12 levels can cause fatigue  Loop recorder has not shown atrial fibrillation thus far  Continue to follow up with PCP regarding blood pressure and cholesterol management  Maintain strict control of hypertension with blood pressure goal below 130/90 and cholesterol with LDL cholesterol (bad cholesterol) goal below 70 mg/dL.   Signs of a Stroke? Follow the BEFAST method:  Balance Watch for a sudden loss of balance, trouble with coordination or vertigo Eyes Is there a sudden loss of vision in one or both eyes? Or double vision?  Face: Ask the person to smile. Does one side of the face droop or is it numb?  Arms: Ask the person to raise both arms. Does one arm drift downward? Is there weakness or numbness of a leg? Speech: Ask the person to repeat a simple phrase. Does the speech sound slurred/strange? Is the person confused ? Time: If you observe any of these signs, call 911.        Thank you for coming to see [place] at [facility]. I hope we have been able to provide you high quality care today.  You may receive a patient satisfaction survey over the next few weeks. We would appreciate your feedback and comments so that we may continue to improve ourselves and the health of our patients.

## 2022-01-04 DIAGNOSIS — E063 Autoimmune thyroiditis: Secondary | ICD-10-CM | POA: Diagnosis not present

## 2022-01-04 DIAGNOSIS — E038 Other specified hypothyroidism: Secondary | ICD-10-CM | POA: Diagnosis not present

## 2022-01-04 DIAGNOSIS — E271 Primary adrenocortical insufficiency: Secondary | ICD-10-CM | POA: Diagnosis not present

## 2022-01-04 DIAGNOSIS — E1065 Type 1 diabetes mellitus with hyperglycemia: Secondary | ICD-10-CM | POA: Diagnosis not present

## 2022-01-04 DIAGNOSIS — E10649 Type 1 diabetes mellitus with hypoglycemia without coma: Secondary | ICD-10-CM | POA: Diagnosis not present

## 2022-01-08 ENCOUNTER — Ambulatory Visit (INDEPENDENT_AMBULATORY_CARE_PROVIDER_SITE_OTHER): Payer: Medicare Other | Admitting: Physician Assistant

## 2022-01-08 ENCOUNTER — Ambulatory Visit (HOSPITAL_COMMUNITY)
Admission: RE | Admit: 2022-01-08 | Discharge: 2022-01-08 | Disposition: A | Discharge status: Home / Self Care | Payer: Medicare Other | Source: Ambulatory Visit | Attending: Surgery | Admitting: Surgery

## 2022-01-08 ENCOUNTER — Ambulatory Visit (INDEPENDENT_AMBULATORY_CARE_PROVIDER_SITE_OTHER)
Admission: RE | Admit: 2022-01-08 | Discharge: 2022-01-08 | Disposition: A | Discharge status: Home / Self Care | Payer: Medicare Other | Source: Ambulatory Visit | Attending: Surgery | Admitting: Surgery

## 2022-01-08 VITALS — BP 140/62 | HR 73 | Temp 97.6°F | Resp 14 | Ht 61.0 in | Wt 118.0 lb

## 2022-01-08 DIAGNOSIS — I779 Disorder of arteries and arterioles, unspecified: Secondary | ICD-10-CM | POA: Diagnosis not present

## 2022-01-08 DIAGNOSIS — Z95828 Presence of other vascular implants and grafts: Secondary | ICD-10-CM

## 2022-01-08 DIAGNOSIS — I70213 Atherosclerosis of native arteries of extremities with intermittent claudication, bilateral legs: Secondary | ICD-10-CM

## 2022-01-08 NOTE — Progress Notes (Signed)
Office Note     CC:  follow up Requesting Provider:  Curlene Labrum, MD  HPI: Kendra Walker is a 71 y.o. (21-Dec-1950) female who presents for surveillance of PAD.  Surgical history significant for right femoral to above-the-knee popliteal bypass with vein in 2008 in [place].  She has also had bilateral common iliac stenting by Dr. Trula Slade in May 2021 due to disabling claudication.  Symptoms resolved after iliac stenting and she has not had any return of claudication.  She also denies any rest pain or tissue loss of bilateral lower extremities.  She is on Plavix and statin daily.  She denies tobacco use.   Past Medical History:  Diagnosis Date   Cancer ([place])    Squamous Cell   Diabetes mellitus without complication ([place])    Hyperlipemia    Hypertension    Hypothyroidism    PAD (peripheral artery disease) ([place])    Stroke [facility])     Past Surgical History:  Procedure Laterality Date   ABDOMINAL AORTOGRAM W/LOWER EXTREMITY N/A 06/09/2019   Procedure: ABDOMINAL AORTOGRAM W/LOWER EXTREMITY;  Surgeon: Serafina Mitchell, MD;  Location: [facility];  Service: Cardiovascular;  Laterality: N/A;   APPENDECTOMY     CATARACT EXTRACTION Bilateral    CORONARY ARTERY BYPASS GRAFT     LOOP RECORDER INSERTION N/A 10/17/2020   Procedure: LOOP RECORDER INSERTION;  Surgeon: Deboraha Sprang, MD;  Location: [facility];  Service: Cardiovascular;  Laterality: N/A;   PERIPHERAL VASCULAR INTERVENTION Bilateral 06/09/2019   Procedure: PERIPHERAL VASCULAR INTERVENTION;  Surgeon: Serafina Mitchell, MD;  Location: [facility];  Service: Cardiovascular;  Laterality: Bilateral;   TONSILLECTOMY     YAG LASER APPLICATION Right 8/65/7846   Procedure: YAG LASER APPLICATION;  Surgeon: Williams Che, MD;  Location: [facility];  Service: Ophthalmology;  Laterality: Right;    Social History   Socioeconomic History   Marital status: Married    Spouse name: [name]   Number of children: Not on file    Years of education: Not on file   Highest education level: Not on file  Occupational History   Not on file  Tobacco Use   Smoking status: Never   Smokeless tobacco: Never  Vaping Use   Vaping Use: Never used  Substance and Sexual Activity   Alcohol use: No    Alcohol/week: 0.0 standard drinks of alcohol   Drug use: No   Sexual activity: Not on file  Other Topics Concern   Not on file  Social History Narrative   Lives with husband   Social Determinants of Health   Financial Resource Strain: Not on file  Food Insecurity: Not on file  Transportation Needs: Not on file  Physical Activity: Not on file  Stress: Not on file  Social Connections: Not on file  Intimate Partner Violence: Not on file    Family History  Problem Relation Age of Onset   Heart disease Mother    Hypertension Mother    Hypertension Father    Sleep apnea Neg Hx     Current Outpatient Medications  Medication Sig Dispense Refill   acetaminophen (TYLENOL) 500 MG tablet Take 500 mg by mouth every 6 (six) hours as needed for mild pain or headache.      ascorbic Acid (VITAMIN C) 500 MG CPCR Take 500 mg by mouth daily.     atorvastatin (LIPITOR) 80 MG tablet Take 1 tablet (80 mg total) by mouth daily. 90 tablet  1   Calcium Carbonate (CALCIUM 600 PO) Take 600 mg by mouth in the morning and at bedtime.     Cholecalciferol (VITAMIN D3) 50 MCG (2000 UT) TABS Take 2,000 Units by mouth daily.     clopidogrel (PLAVIX) 75 MG tablet Take 1 tablet (75 mg total) by mouth daily. 90 tablet 3   fludrocortisone (FLORINEF) 0.1 MG tablet Take 0.05 mg by mouth daily.     HUMALOG 100 UNIT/ML injection Inject into the skin See admin instructions. VIA INSULIN PUMP     indapamide (LOZOL) 1.25 MG tablet Take by mouth.     Insulin Human (INSULIN PUMP) SOLN Inject into the skin. HUMALOG 100 UNIT/ML     iron polysaccharides (NIFEREX) 150 MG capsule Take 150 mg by mouth at bedtime.      levothyroxine (SYNTHROID) 112 MCG tablet Take  112 mcg by mouth daily.     magnesium chloride (SLOW-MAG) 64 MG TBEC SR tablet Take 1-2 tablets by mouth See admin instructions. TAKE 1 TABLET IN THE MORNING & TAKE 2 TABLETS AT NIGHT.     Multiple Vitamin (MULTIVITAMIN WITH MINERALS) TABS tablet Take 1 tablet by mouth daily.     Multiple Vitamins-Minerals (PRESERVISION AREDS 2 PO) Take 1 tablet by mouth daily.      Omega-3 Fatty Acids (FISH OIL) 1200 MG CAPS Take 1,200 mg by mouth daily.     predniSONE (DELTASONE) 5 MG tablet Take 5 mg by mouth daily with breakfast.     VELTASSA 8.4 g packet Take 8.4 g by mouth once a week.     No current facility-administered medications for this visit.    Allergies  Allergen Reactions   Bactrim [Sulfamethoxazole-Trimethoprim] Other (See Comments)    Elevates potassium.    Tape Other (See Comments)    not allergic but causes sensitivity.   Fluorescein Rash   Procrit [Epoetin (Alfa)] Rash     REVIEW OF SYSTEMS:   '[X]'$  denotes positive finding, '[ ]'$  denotes negative finding Cardiac  Comments:  Chest pain or chest pressure:    Shortness of breath upon exertion:    Short of breath when lying flat:    Irregular heart rhythm:        Vascular    Pain in calf, thigh, or hip brought on by ambulation:    Pain in feet at night that wakes you up from your sleep:     Blood clot in your veins:    Leg swelling:         Pulmonary    Oxygen at home:    Productive cough:     Wheezing:         Neurologic    Sudden weakness in arms or legs:     Sudden numbness in arms or legs:     Sudden onset of difficulty speaking or slurred speech:    Temporary loss of vision in one eye:     Problems with dizziness:         Gastrointestinal    Blood in stool:     Vomited blood:         Genitourinary    Burning when urinating:     Blood in urine:        Psychiatric    Major depression:         Hematologic    Bleeding problems:    Problems with blood clotting too easily:        Skin    Rashes or ulcers:  Constitutional    Fever or chills:      PHYSICAL EXAMINATION:  Vitals:   01/08/22 1007  BP: (!) 140/62  Pulse: 73  Resp: 14  Temp: 97.6 F (36.4 C)  TempSrc: Temporal  SpO2: 98%  Weight: 118 lb (53.5 kg)  Height: '5\' 1"'$  (1.549 m)    General:  WDWN in NAD; vital signs documented above Gait: Not observed HENT: WNL, normocephalic Pulmonary: normal non-labored breathing , without Rales, rhonchi,  wheezing Cardiac: regular HR Abdomen: soft, NT, no masses Skin: without rashes Vascular Exam/Pulses:palpable ATA pulses Extremities: without ischemic changes, without Gangrene , without cellulitis; without open wounds;  Musculoskeletal: no muscle wasting or atrophy  Neurologic: A&O X 3;  No focal weakness or paresthesias are detected Psychiatric:  The pt has Normal affect.   Non-Invasive Vascular Imaging:   Iliac stents bilaterally widely patent  Right leg bypass patent with 263 cm/s at the proximal popliteal artery 272 cm/s at the proximal graft  ABI/TBIToday's ABIToday's TBIPrevious ABIPrevious TBI  +-------+-----------+-----------+------------+------------+  Right [place]         0.61       1.20        0.66          +-------+-----------+-----------+------------+------------+  Left  [place]         0.58       1.21        0.82     ASSESSMENT/PLAN:: 71 y.o. female here for follow up for surveillance of PAD with history of right leg bypass as well as bilateral common iliac stenting  -Bilateral lower extremities are well-perfused with palpable ATA pulses -Duplex shows widely patent iliac stents.  She has some areas of mild to moderate stenosis on her right leg duplex however no hemodynamic significance.  We will repeat bypass duplex as well as aortoiliac duplex in 1 year -Continue statin and Plavix daily -Patient knows to call/return office sooner with any questions or concerns.   Dagoberto Ligas, PA-C [facility] [phone]  Clinic MD:    Virl Cagey on call

## 2022-01-11 DIAGNOSIS — L84 Corns and callosities: Secondary | ICD-10-CM | POA: Diagnosis not present

## 2022-01-11 DIAGNOSIS — E1142 Type 2 diabetes mellitus with diabetic polyneuropathy: Secondary | ICD-10-CM | POA: Diagnosis not present

## 2022-01-16 ENCOUNTER — Ambulatory Visit (INDEPENDENT_AMBULATORY_CARE_PROVIDER_SITE_OTHER): Payer: Medicare Other

## 2022-01-16 DIAGNOSIS — I639 Cerebral infarction, unspecified: Secondary | ICD-10-CM

## 2022-01-16 LAB — CUP PACEART REMOTE DEVICE CHECK
Date Time Interrogation Session: 20231225231421
Implantable Pulse Generator Implant Date: 20220926

## 2022-01-17 ENCOUNTER — Ambulatory Visit: Payer: Medicare Other | Admitting: Adult Health

## 2022-01-23 NOTE — Progress Notes (Signed)
Carelink Summary Report / Loop Recorder 

## 2022-01-30 DIAGNOSIS — E7849 Other hyperlipidemia: Secondary | ICD-10-CM | POA: Diagnosis not present

## 2022-01-30 DIAGNOSIS — E875 Hyperkalemia: Secondary | ICD-10-CM | POA: Diagnosis not present

## 2022-01-30 DIAGNOSIS — D649 Anemia, unspecified: Secondary | ICD-10-CM | POA: Diagnosis not present

## 2022-01-30 DIAGNOSIS — E782 Mixed hyperlipidemia: Secondary | ICD-10-CM | POA: Diagnosis not present

## 2022-01-30 DIAGNOSIS — E1121 Type 2 diabetes mellitus with diabetic nephropathy: Secondary | ICD-10-CM | POA: Diagnosis not present

## 2022-01-30 DIAGNOSIS — I10 Essential (primary) hypertension: Secondary | ICD-10-CM | POA: Diagnosis not present

## 2022-01-30 DIAGNOSIS — E039 Hypothyroidism, unspecified: Secondary | ICD-10-CM | POA: Diagnosis not present

## 2022-02-07 DIAGNOSIS — Z23 Encounter for immunization: Secondary | ICD-10-CM | POA: Diagnosis not present

## 2022-02-07 DIAGNOSIS — E1021 Type 1 diabetes mellitus with diabetic nephropathy: Secondary | ICD-10-CM | POA: Diagnosis not present

## 2022-02-07 DIAGNOSIS — N184 Chronic kidney disease, stage 4 (severe): Secondary | ICD-10-CM | POA: Diagnosis not present

## 2022-02-07 DIAGNOSIS — M8580 Other specified disorders of bone density and structure, unspecified site: Secondary | ICD-10-CM | POA: Diagnosis not present

## 2022-02-07 DIAGNOSIS — I10 Essential (primary) hypertension: Secondary | ICD-10-CM | POA: Diagnosis not present

## 2022-02-07 DIAGNOSIS — E875 Hyperkalemia: Secondary | ICD-10-CM | POA: Diagnosis not present

## 2022-02-07 DIAGNOSIS — E7849 Other hyperlipidemia: Secondary | ICD-10-CM | POA: Diagnosis not present

## 2022-02-07 DIAGNOSIS — E782 Mixed hyperlipidemia: Secondary | ICD-10-CM | POA: Diagnosis not present

## 2022-02-07 DIAGNOSIS — E039 Hypothyroidism, unspecified: Secondary | ICD-10-CM | POA: Diagnosis not present

## 2022-02-07 DIAGNOSIS — Z6821 Body mass index (BMI) 21.0-21.9, adult: Secondary | ICD-10-CM | POA: Diagnosis not present

## 2022-02-07 DIAGNOSIS — I739 Peripheral vascular disease, unspecified: Secondary | ICD-10-CM | POA: Diagnosis not present

## 2022-02-07 DIAGNOSIS — I639 Cerebral infarction, unspecified: Secondary | ICD-10-CM | POA: Diagnosis not present

## 2022-02-07 NOTE — Progress Notes (Signed)
[facility] Summary Report / Loop Recorder

## 2022-02-12 ENCOUNTER — Encounter: Payer: Medicare Other | Admitting: Internal Medicine

## 2022-02-15 ENCOUNTER — Other Ambulatory Visit: Payer: Self-pay | Admitting: Adult Health

## 2022-02-15 DIAGNOSIS — I639 Cerebral infarction, unspecified: Secondary | ICD-10-CM

## 2022-02-16 DIAGNOSIS — E875 Hyperkalemia: Secondary | ICD-10-CM | POA: Diagnosis not present

## 2022-02-19 ENCOUNTER — Ambulatory Visit: Payer: Medicare Other | Attending: Internal Medicine

## 2022-02-19 DIAGNOSIS — I639 Cerebral infarction, unspecified: Secondary | ICD-10-CM | POA: Diagnosis not present

## 2022-02-20 LAB — CUP PACEART REMOTE DEVICE CHECK
Date Time Interrogation Session: 20240127230412
Implantable Pulse Generator Implant Date: 20220926

## 2022-03-18 DIAGNOSIS — I5032 Chronic diastolic (congestive) heart failure: Secondary | ICD-10-CM | POA: Diagnosis not present

## 2022-03-18 DIAGNOSIS — N3000 Acute cystitis without hematuria: Secondary | ICD-10-CM | POA: Diagnosis not present

## 2022-03-18 DIAGNOSIS — E039 Hypothyroidism, unspecified: Secondary | ICD-10-CM | POA: Diagnosis not present

## 2022-03-18 DIAGNOSIS — I739 Peripheral vascular disease, unspecified: Secondary | ICD-10-CM | POA: Diagnosis not present

## 2022-03-18 DIAGNOSIS — N183 Chronic kidney disease, stage 3 unspecified: Secondary | ICD-10-CM | POA: Diagnosis not present

## 2022-03-18 DIAGNOSIS — R509 Fever, unspecified: Secondary | ICD-10-CM | POA: Diagnosis not present

## 2022-03-18 DIAGNOSIS — Z882 Allergy status to sulfonamides status: Secondary | ICD-10-CM | POA: Diagnosis not present

## 2022-03-18 DIAGNOSIS — I130 Hypertensive heart and chronic kidney disease with heart failure and stage 1 through stage 4 chronic kidney disease, or unspecified chronic kidney disease: Secondary | ICD-10-CM | POA: Diagnosis not present

## 2022-03-18 DIAGNOSIS — E1122 Type 2 diabetes mellitus with diabetic chronic kidney disease: Secondary | ICD-10-CM | POA: Diagnosis not present

## 2022-03-18 DIAGNOSIS — E785 Hyperlipidemia, unspecified: Secondary | ICD-10-CM | POA: Diagnosis not present

## 2022-03-18 DIAGNOSIS — Z79899 Other long term (current) drug therapy: Secondary | ICD-10-CM | POA: Diagnosis not present

## 2022-03-18 DIAGNOSIS — U071 COVID-19: Secondary | ICD-10-CM | POA: Diagnosis not present

## 2022-03-18 DIAGNOSIS — Z794 Long term (current) use of insulin: Secondary | ICD-10-CM | POA: Diagnosis not present

## 2022-03-18 DIAGNOSIS — I252 Old myocardial infarction: Secondary | ICD-10-CM | POA: Diagnosis not present

## 2022-03-18 DIAGNOSIS — E271 Primary adrenocortical insufficiency: Secondary | ICD-10-CM | POA: Diagnosis not present

## 2022-03-18 DIAGNOSIS — I2510 Atherosclerotic heart disease of native coronary artery without angina pectoris: Secondary | ICD-10-CM | POA: Diagnosis not present

## 2022-03-26 ENCOUNTER — Ambulatory Visit (INDEPENDENT_AMBULATORY_CARE_PROVIDER_SITE_OTHER): Payer: Medicare Other

## 2022-03-26 DIAGNOSIS — I639 Cerebral infarction, unspecified: Secondary | ICD-10-CM

## 2022-03-26 LAB — CUP PACEART REMOTE DEVICE CHECK
Date Time Interrogation Session: 20240303230537
Implantable Pulse Generator Implant Date: 20220926

## 2022-03-27 DIAGNOSIS — R030 Elevated blood-pressure reading, without diagnosis of hypertension: Secondary | ICD-10-CM | POA: Diagnosis not present

## 2022-03-27 DIAGNOSIS — Z20828 Contact with and (suspected) exposure to other viral communicable diseases: Secondary | ICD-10-CM | POA: Diagnosis not present

## 2022-03-27 DIAGNOSIS — E1021 Type 1 diabetes mellitus with diabetic nephropathy: Secondary | ICD-10-CM | POA: Diagnosis not present

## 2022-03-27 DIAGNOSIS — B9689 Other specified bacterial agents as the cause of diseases classified elsewhere: Secondary | ICD-10-CM | POA: Diagnosis not present

## 2022-03-27 DIAGNOSIS — N390 Urinary tract infection, site not specified: Secondary | ICD-10-CM | POA: Diagnosis not present

## 2022-03-27 DIAGNOSIS — Z6821 Body mass index (BMI) 21.0-21.9, adult: Secondary | ICD-10-CM | POA: Diagnosis not present

## 2022-03-27 DIAGNOSIS — N184 Chronic kidney disease, stage 4 (severe): Secondary | ICD-10-CM | POA: Diagnosis not present

## 2022-04-02 ENCOUNTER — Encounter: Payer: Medicare Other | Admitting: Internal Medicine

## 2022-04-03 NOTE — Progress Notes (Signed)
Carelink Summary Report / Loop Recorder 

## 2022-04-10 DIAGNOSIS — E063 Autoimmune thyroiditis: Secondary | ICD-10-CM | POA: Diagnosis not present

## 2022-04-12 DIAGNOSIS — L603 Nail dystrophy: Secondary | ICD-10-CM | POA: Diagnosis not present

## 2022-04-12 DIAGNOSIS — L84 Corns and callosities: Secondary | ICD-10-CM | POA: Diagnosis not present

## 2022-04-12 DIAGNOSIS — E1142 Type 2 diabetes mellitus with diabetic polyneuropathy: Secondary | ICD-10-CM | POA: Diagnosis not present

## 2022-04-17 ENCOUNTER — Encounter (INDEPENDENT_AMBULATORY_CARE_PROVIDER_SITE_OTHER): Payer: Medicare Other | Admitting: Ophthalmology

## 2022-04-30 ENCOUNTER — Ambulatory Visit (INDEPENDENT_AMBULATORY_CARE_PROVIDER_SITE_OTHER): Payer: Medicare Other

## 2022-04-30 DIAGNOSIS — I639 Cerebral infarction, unspecified: Secondary | ICD-10-CM | POA: Diagnosis not present

## 2022-05-01 DIAGNOSIS — E113553 Type 2 diabetes mellitus with stable proliferative diabetic retinopathy, bilateral: Secondary | ICD-10-CM | POA: Diagnosis not present

## 2022-05-01 DIAGNOSIS — H353212 Exudative age-related macular degeneration, right eye, with inactive choroidal neovascularization: Secondary | ICD-10-CM | POA: Diagnosis not present

## 2022-05-01 DIAGNOSIS — H353133 Nonexudative age-related macular degeneration, bilateral, advanced atrophic without subfoveal involvement: Secondary | ICD-10-CM | POA: Diagnosis not present

## 2022-05-01 DIAGNOSIS — H35373 Puckering of macula, bilateral: Secondary | ICD-10-CM | POA: Diagnosis not present

## 2022-05-01 DIAGNOSIS — Z794 Long term (current) use of insulin: Secondary | ICD-10-CM | POA: Diagnosis not present

## 2022-05-01 LAB — CUP PACEART REMOTE DEVICE CHECK
Date Time Interrogation Session: 20240405230215
Implantable Pulse Generator Implant Date: 20220926

## 2022-05-01 NOTE — Progress Notes (Signed)
Carelink Summary Report / Loop Recorder 

## 2022-05-21 ENCOUNTER — Other Ambulatory Visit: Payer: Self-pay | Admitting: Cardiology

## 2022-05-30 ENCOUNTER — Ambulatory Visit (INDEPENDENT_AMBULATORY_CARE_PROVIDER_SITE_OTHER): Payer: Medicare Other

## 2022-05-30 DIAGNOSIS — E875 Hyperkalemia: Secondary | ICD-10-CM | POA: Diagnosis not present

## 2022-05-30 DIAGNOSIS — N1832 Chronic kidney disease, stage 3b: Secondary | ICD-10-CM | POA: Diagnosis not present

## 2022-05-30 DIAGNOSIS — E039 Hypothyroidism, unspecified: Secondary | ICD-10-CM | POA: Diagnosis not present

## 2022-05-30 DIAGNOSIS — I639 Cerebral infarction, unspecified: Secondary | ICD-10-CM

## 2022-05-30 DIAGNOSIS — I10 Essential (primary) hypertension: Secondary | ICD-10-CM | POA: Diagnosis not present

## 2022-05-30 DIAGNOSIS — E559 Vitamin D deficiency, unspecified: Secondary | ICD-10-CM | POA: Diagnosis not present

## 2022-05-30 DIAGNOSIS — E1169 Type 2 diabetes mellitus with other specified complication: Secondary | ICD-10-CM | POA: Diagnosis not present

## 2022-05-30 DIAGNOSIS — E7849 Other hyperlipidemia: Secondary | ICD-10-CM | POA: Diagnosis not present

## 2022-05-30 DIAGNOSIS — D519 Vitamin B12 deficiency anemia, unspecified: Secondary | ICD-10-CM | POA: Diagnosis not present

## 2022-05-30 DIAGNOSIS — D631 Anemia in chronic kidney disease: Secondary | ICD-10-CM | POA: Diagnosis not present

## 2022-05-31 LAB — CUP PACEART REMOTE DEVICE CHECK
Date Time Interrogation Session: 20240508230649
Implantable Pulse Generator Implant Date: 20220926

## 2022-06-01 ENCOUNTER — Ambulatory Visit: Payer: Medicare Other | Attending: Internal Medicine | Admitting: Internal Medicine

## 2022-06-01 ENCOUNTER — Encounter: Payer: Self-pay | Admitting: Internal Medicine

## 2022-06-01 VITALS — BP 128/66 | HR 73 | Ht 61.0 in | Wt 118.6 lb

## 2022-06-01 DIAGNOSIS — Z9889 Other specified postprocedural states: Secondary | ICD-10-CM | POA: Diagnosis not present

## 2022-06-01 DIAGNOSIS — I639 Cerebral infarction, unspecified: Secondary | ICD-10-CM | POA: Insufficient documentation

## 2022-06-01 NOTE — Progress Notes (Signed)
Patient Care Team: Juliette Alcide, MD as PCP - General Branch, Dorothe Pea, MD as PCP - Cardiology (Cardiology) Duke Salvia, MD as PCP - Electrophysiology (Cardiology) Charolotte Capuchin, MD (Internal Medicine)   HPI  Kendra Walker is a 72 y.o. female Seen in follow-up for a loop recorder implanted 9/22 for cryptogenic stroke occurring in the context of coronary disease with prior bypass diabetes hyperlipidemia peripheral vascular disease and thyroiditis.  And adrenal insufficiency. Because  of ongoing complaints of discomfort related to device site, the decision was made to remove it. Previously made and rescheduled   Still with discomfort.  DATE TEST EF   9/22 TTE  60-65 %               Date Cr K Hgb  11/23 1.64 5.1 13.0  2/24 1.86 4.3 12.1    Records and Results Reviewed1   Past Medical History:  Diagnosis Date   Cancer ([facility])    Squamous Cell   Diabetes mellitus without complication ([facility])    Hyperlipemia    Hypertension    Hypothyroidism    PAD (peripheral artery disease) ([facility])    Stroke [facility])     Past Surgical History:  Procedure Laterality Date   ABDOMINAL AORTOGRAM W/LOWER EXTREMITY N/A 06/09/2019   Procedure: ABDOMINAL AORTOGRAM W/LOWER EXTREMITY;  Surgeon: Nada Libman, MD;  Location: [facility];  Service: Cardiovascular;  Laterality: N/A;   APPENDECTOMY     CATARACT EXTRACTION Bilateral    CORONARY ARTERY BYPASS GRAFT     LOOP RECORDER INSERTION N/A 10/17/2020   Procedure: LOOP RECORDER INSERTION;  Surgeon: Duke Salvia, MD;  Location: [facility];  Service: Cardiovascular;  Laterality: N/A;   PERIPHERAL VASCULAR INTERVENTION Bilateral 06/09/2019   Procedure: PERIPHERAL VASCULAR INTERVENTION;  Surgeon: Nada Libman, MD;  Location: [facility];  Service: Cardiovascular;  Laterality: Bilateral;   TONSILLECTOMY     YAG LASER APPLICATION Right 05/11/2013   Procedure: YAG LASER APPLICATION;  Surgeon: Susa Simmonds, MD;  Location: [facility];  Service: Ophthalmology;  Laterality: Right;    Current Meds  Medication Sig   acetaminophen (TYLENOL) 500 MG tablet Take 500 mg by mouth every 6 (six) hours as needed for mild pain or headache.    ascorbic Acid (VITAMIN C) 500 MG CPCR Take 500 mg by mouth daily.   atorvastatin (LIPITOR) 80 MG tablet TAKE 1 TABLET BY MOUTH EVERY DAY   Calcium Carbonate (CALCIUM 600 PO) Take 600 mg by mouth every morning.   Cholecalciferol (VITAMIN D3) 50 MCG (2000 UT) TABS Take 2,000 Units by mouth daily.   clopidogrel (PLAVIX) 75 MG tablet TAKE 1 TABLET BY MOUTH DAILY   fludrocortisone (FLORINEF) 0.1 MG tablet Take 0.05 mg by mouth daily.   HUMALOG 100 UNIT/ML injection Inject into the skin See admin instructions. VIA INSULIN PUMP   indapamide (LOZOL) 1.25 MG tablet Take by mouth.   Insulin Human (INSULIN PUMP) SOLN Inject into the skin. HUMALOG 100 UNIT/ML   iron polysaccharides (NIFEREX) 150 MG capsule Take 150 mg by mouth at bedtime.    levothyroxine (SYNTHROID) 100 MCG tablet Take 100 mcg by mouth daily.   magnesium chloride (SLOW-MAG) 64 MG TBEC SR tablet Take 1-2 tablets by mouth See admin instructions. TAKE 1 TABLET IN THE MORNING & TAKE 2 TABLETS AT NIGHT.   Multiple Vitamin (MULTIVITAMIN WITH MINERALS) TABS tablet Take 1 tablet by mouth daily.  Multiple Vitamins-Minerals (PRESERVISION AREDS 2 PO) Take 1 tablet by mouth daily.    Omega-3 Fatty Acids (FISH OIL) 1200 MG CAPS Take 1,200 mg by mouth daily.   predniSONE (DELTASONE) 5 MG tablet Take 5 mg by mouth daily with breakfast.   VELTASSA 8.4 g packet Take 8.4 g by mouth once a week.    Allergies  Allergen Reactions   Bactrim [Sulfamethoxazole-Trimethoprim] Other (See Comments)    Elevates potassium.    Tape Other (See Comments)    not allergic but causes sensitivity.   Fluorescein Rash   Procrit [Epoetin (Alfa)] Rash      Review of Systems negative except from HPI and PMH  Physical Exam BP 128/66    Pulse 73   Ht 5\' 1"  (1.549 m)   Wt 118 lb 9.6 oz (53.8 kg)   SpO2 99%   BMI 22.41 kg/m  Well developed and nourished in no acute distress HENT normal Neck supple with JVP-  flat  Clear Regular rate and rhythm, no murmurs or gallops Abd-soft with active BS No Clubbing cyanosis edema Skin-warm and dry A & Oriented  Grossly normal sensory and motor function  ECG     CrCl cannot be calculated (Patient's most recent lab result is older than the maximum 21 days allowed.).   Assessment and  Plan  Cryptogenic stroke  Implantable loop recorder  Chest pain intermittent localizing to her implantable loop recorder    For loop recorder removal   Kendra Walker [id number]  [id number]  Preop Dx: pain at loop site Postop Dx same/   Procedure:removal of loop recorder  Following the obtained informed consent the patient prepped, the patient was given lidocaine.  Incision was made and the device was explanted with some difficulty.  Steri-Strip benzoin dressing was applied  Tolerated well       Sherryl Manges, MD 06/01/2022 9:07 AM

## 2022-06-01 NOTE — Patient Instructions (Signed)
Medication Instructions:  Your physician recommends that you continue on your current medications as directed. Please refer to the Current Medication list given to you today.  *If you need a refill on your cardiac medications before your next appointment, please call your pharmacy*   Lab Work: None ordered.  If you have labs (blood work) drawn today and your tests are completely normal, you will receive your results only by: MyChart Message (if you have MyChart) OR A paper copy in the mail If you have any lab test that is abnormal or we need to change your treatment, we will call you to review the results.   Testing/Procedures: Loop Implant removed today   Follow-Up: At [facility], you and your health needs are our priority.  As part of our continuing mission to provide you with exceptional heart care, we have created designated Provider Care Teams.  These Care Teams include your primary Cardiologist (physician) and Advanced Practice Providers (APPs -  Physician Assistants and Nurse Practitioners) who all work together to provide you with the care you need, when you need it.  We recommend signing up for the patient portal called "MyChart".  Sign up information is provided on this After Visit Summary.  MyChart is used to connect with patients for Virtual Visits (Telemedicine).  Patients are able to view lab/test results, encounter notes, upcoming appointments, etc.  Non-urgent messages can be sent to your provider as well.   To learn more about what you can do with MyChart, go to [email or web address].    Your next appointment:    Follow up with Dr Graciela Husbands only as needed     Implantable Loop Recorder Removal, Care After This sheet gives you information about how to care for yourself after your procedure. Your health care provider may also give you more specific instructions. If you have problems or questions, contact your health care provider. What can I expect after the  procedure? After the procedure, it is common to have: Soreness or discomfort near the incision. Some swelling or bruising near the incision.  Follow these instructions at home: Incision care  Monitor your cardiac device site for redness, swelling, and drainage. Call the device clinic at [phone] if you experience these symptoms or fever/chills.  Keep the large square bandage on your site until Sunday and then you may remove it yourself. Keep the steri-strips underneath in place until Wednesday. Or you may let them fall off on their own.  You may shower after 72 hours / 3 days from your procedure with the steri-strips in place. They will usually fall off on their own, or may be removed after 10 days. Pat dry.   Avoid lotions, ointments, or perfumes over your incision until it is well-healed.  Please do not submerge in water until your site is completely healed.   If your wound site starts to bleed apply pressure.       If you have any questions/concerns please call the device clinic at [phone].  Activity  Return to your normal activities.  Contact a health care provider if: You have redness, swelling, or pain around your incision. You have a fever.

## 2022-06-04 NOTE — Progress Notes (Signed)
Carelink Summary Report / Loop Recorder 

## 2022-06-06 DIAGNOSIS — N184 Chronic kidney disease, stage 4 (severe): Secondary | ICD-10-CM | POA: Diagnosis not present

## 2022-06-06 DIAGNOSIS — E7849 Other hyperlipidemia: Secondary | ICD-10-CM | POA: Diagnosis not present

## 2022-06-06 DIAGNOSIS — Z23 Encounter for immunization: Secondary | ICD-10-CM | POA: Diagnosis not present

## 2022-06-06 DIAGNOSIS — Z1331 Encounter for screening for depression: Secondary | ICD-10-CM | POA: Diagnosis not present

## 2022-06-06 DIAGNOSIS — E875 Hyperkalemia: Secondary | ICD-10-CM | POA: Diagnosis not present

## 2022-06-06 DIAGNOSIS — Z0001 Encounter for general adult medical examination with abnormal findings: Secondary | ICD-10-CM | POA: Diagnosis not present

## 2022-06-06 DIAGNOSIS — Z6821 Body mass index (BMI) 21.0-21.9, adult: Secondary | ICD-10-CM | POA: Diagnosis not present

## 2022-06-06 DIAGNOSIS — Z1389 Encounter for screening for other disorder: Secondary | ICD-10-CM | POA: Diagnosis not present

## 2022-06-06 DIAGNOSIS — I10 Essential (primary) hypertension: Secondary | ICD-10-CM | POA: Diagnosis not present

## 2022-06-06 DIAGNOSIS — I739 Peripheral vascular disease, unspecified: Secondary | ICD-10-CM | POA: Diagnosis not present

## 2022-06-06 DIAGNOSIS — E039 Hypothyroidism, unspecified: Secondary | ICD-10-CM | POA: Diagnosis not present

## 2022-06-06 DIAGNOSIS — E1021 Type 1 diabetes mellitus with diabetic nephropathy: Secondary | ICD-10-CM | POA: Diagnosis not present

## 2022-06-19 DIAGNOSIS — N1832 Chronic kidney disease, stage 3b: Secondary | ICD-10-CM | POA: Diagnosis not present

## 2022-06-20 NOTE — Progress Notes (Signed)
Carelink Summary Report / Loop Recorder 

## 2022-06-27 ENCOUNTER — Ambulatory Visit: Payer: Medicare Other | Attending: Cardiology | Admitting: Cardiology

## 2022-06-27 ENCOUNTER — Encounter: Payer: Self-pay | Admitting: *Deleted

## 2022-06-27 ENCOUNTER — Encounter: Payer: Self-pay | Admitting: Cardiology

## 2022-06-27 VITALS — BP 124/74 | HR 68 | Ht 61.0 in | Wt 120.4 lb

## 2022-06-27 DIAGNOSIS — I10 Essential (primary) hypertension: Secondary | ICD-10-CM

## 2022-06-27 DIAGNOSIS — I2510 Atherosclerotic heart disease of native coronary artery without angina pectoris: Secondary | ICD-10-CM | POA: Diagnosis not present

## 2022-06-27 DIAGNOSIS — E782 Mixed hyperlipidemia: Secondary | ICD-10-CM | POA: Diagnosis not present

## 2022-06-27 NOTE — Patient Instructions (Addendum)

## 2022-06-27 NOTE — Progress Notes (Signed)
Clinical Summary Kendra Walker is a 72 y.o.female seen today for follow up of the following medical problems.     1. CAD -  previously followed by [facility]. 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   - LVEF March 2011 reported normal LVEF. Echo 04/2011 LVEF 55-60%. - 12/2014 Lexiscan without ischemia - 08/2016 echo LVEF 60-65%, grade II diastolic dysfunction - 09/2020 LVEF 60-65%, grade I,      -currerntly on plavix per neuro, we have deferred antiplatelet agents to neurology given recurrent CVAs   - no chest pains, no SOB/DOE - compliant with meds   2. PAD - hx of right common femoral to popliteal bypass in [place] July 2008 - followed by vascular     05/2020 stents to bilateral common iliac arteries   3. 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 Adrenal insufficiency - followed by endocrine     6. CKD - followed at [facility]     7. Chronic diastolic HF - echo 08/2016 LVEF 60-65%, grade II diastolic dysfunction. Mild to moderate RV dysfunction. PASP 23.  - no recent issues with edema, actually on florinef for adrenal insufficiency history.    - denies any edema   8. CVA - admit 09/2020 with CVA - was discharged with loop recorder - 04/2021 loop recorder check was benign. Loop removed 05/2022 due to discomfort   - she is plavix alone perneuro    9. HTN - home bp's 130/60s   - renal held norvasc and started indapamide during 11/28/21 visit - defer further changes to neprhology.   -she is compliant with meds     Past Medical History:  Diagnosis Date   Cancer (HCC)    Squamous Cell   Diabetes mellitus without  complication (HCC)    Hyperlipemia    Hypertension    Hypothyroidism    PAD (peripheral artery disease) (HCC)    Stroke (HCC)      Allergies  Allergen Reactions   Bactrim [Sulfamethoxazole-Trimethoprim] Other (See Comments)    Elevates potassium.    Tape Other (See Comments)    not allergic but causes sensitivity.   Fluorescein Rash   Procrit [Epoetin (Alfa)] Rash     Current Outpatient Medications  Medication Sig Dispense Refill   acetaminophen (TYLENOL) 500 MG tablet Take 500 mg by mouth every 6 (six) hours as needed for mild pain or headache.      ascorbic Acid (VITAMIN C) 500 MG CPCR Take 500 mg by mouth daily.     atorvastatin (LIPITOR) 80 MG tablet TAKE 1 TABLET BY MOUTH EVERY DAY 90 tablet 1   Calcium Carbonate (CALCIUM 600 PO) Take 600 mg by mouth every morning.     Cholecalciferol (VITAMIN D3) 50 MCG (2000 UT) TABS Take 2,000 Units by mouth daily.     clopidogrel (PLAVIX) 75 MG tablet TAKE 1 TABLET BY MOUTH DAILY 90 tablet 3   fludrocortisone (FLORINEF) 0.1 MG tablet Take 0.05 mg by mouth daily.     HUMALOG 100 UNIT/ML injection Inject  into the skin See admin instructions. VIA INSULIN PUMP     indapamide (LOZOL) 1.25 MG tablet Take by mouth.     Insulin Human (INSULIN PUMP) SOLN Inject into the skin. HUMALOG 100 UNIT/ML     iron polysaccharides (NIFEREX) 150 MG capsule Take 150 mg by mouth at bedtime.      levothyroxine (SYNTHROID) 100 MCG tablet Take 100 mcg by mouth daily.     magnesium chloride (SLOW-MAG) 64 MG TBEC SR tablet Take 1-2 tablets by mouth See admin instructions. TAKE 1 TABLET IN THE MORNING & TAKE 2 TABLETS AT NIGHT.     Multiple Vitamin (MULTIVITAMIN WITH MINERALS) TABS tablet Take 1 tablet by mouth daily.     Multiple Vitamins-Minerals (PRESERVISION AREDS 2 PO) Take 1 tablet by mouth daily.      Omega-3 Fatty Acids (FISH OIL) 1200 MG CAPS Take 1,200 mg by mouth daily.     predniSONE (DELTASONE) 5 MG tablet Take 5 mg by mouth daily with breakfast.      VELTASSA 8.4 g packet Take 8.4 g by mouth once a week.     No current facility-administered medications for this visit.     Past Surgical History:  Procedure Laterality Date   ABDOMINAL AORTOGRAM W/LOWER EXTREMITY N/A 06/09/2019   Procedure: ABDOMINAL AORTOGRAM W/LOWER EXTREMITY;  Surgeon: Nada Libman, MD;  Location: [facility] INVASIVE CV [facility];  Service: Cardiovascular;  Laterality: N/A;   APPENDECTOMY     CATARACT EXTRACTION Bilateral    CORONARY ARTERY BYPASS GRAFT     LOOP RECORDER INSERTION N/A 10/17/2020   Procedure: LOOP RECORDER INSERTION;  Surgeon: Duke Salvia, MD;  Location: [facility] INVASIVE CV LAB;  Service: Cardiovascular;  Laterality: N/A;   PERIPHERAL VASCULAR INTERVENTION Bilateral 06/09/2019   Procedure: PERIPHERAL VASCULAR INTERVENTION;  Surgeon: Nada Libman, MD;  Location: [facility] INVASIVE CV [facility];  Service: Cardiovascular;  Laterality: Bilateral;   TONSILLECTOMY     YAG LASER APPLICATION Right 05/11/2013   Procedure: YAG LASER APPLICATION;  Surgeon: Susa Simmonds, MD;  Location: [facility];  Service: Ophthalmology;  Laterality: Right;     Allergies  Allergen Reactions   Bactrim [Sulfamethoxazole-Trimethoprim] Other (See Comments)    Elevates potassium.    Tape Other (See Comments)    not allergic but causes sensitivity.   Fluorescein Rash   Procrit [Epoetin (Alfa)] Rash      Family History  Problem Relation Age of Onset   Heart disease Mother    Hypertension Mother    Hypertension Father    Sleep apnea Neg Hx      Social History Kendra Walker reports that she has never smoked. She has never used smokeless tobacco. Kendra Walker reports no history of alcohol use.   Review of Systems CONSTITUTIONAL: No weight loss, fever, chills, weakness or fatigue.  HEENT: Eyes: No visual loss, blurred vision, double vision or yellow sclerae.No hearing loss, sneezing, congestion, runny nose or sore throat.  SKIN: No rash or itching.  CARDIOVASCULAR: per hpi RESPIRATORY: No  shortness of breath, cough or sputum.  GASTROINTESTINAL: No anorexia, nausea, vomiting or diarrhea. No abdominal pain or blood.  GENITOURINARY: No burning on urination, no polyuria NEUROLOGICAL: No headache, dizziness, syncope, paralysis, ataxia, numbness or tingling in the extremities. No change in bowel or bladder control.  MUSCULOSKELETAL: No muscle, back pain, joint pain or stiffness.  LYMPHATICS: No enlarged nodes. No history of splenectomy.  PSYCHIATRIC: No history of depression or anxiety.  ENDOCRINOLOGIC: No reports of sweating, cold or heat intolerance.  No polyuria or polydipsia.  Marland Kitchen   Physical Examination Today's Vitals   06/27/22 1507  BP: 124/74  Pulse: 68  SpO2: 98%  Weight: 120 lb 6.4 oz (54.6 kg)  Height: 5\' 1"  (1.549 m)   Body mass index is 22.75 kg/m.  Gen: resting comfortably, no acute distress HEENT: no scleral icterus, pupils equal round and reactive, no palptable cervical adenopathy,  CV: RRR, no m/rg, no jvd Resp: Clear to auscultation bilaterally GI: abdomen is soft, non-tender, non-distended, normal bowel sounds, no hepatosplenomegaly MSK: extremities are warm, no edema.  Skin: warm, no rash Neuro:  no focal deficits Psych: appropriate affect   Diagnostic Studies  04/2011 Echo FINDINGS:  LEFT VENTRICLE The left ventricular size is normal. There is normal left ventricular wall  thickness. Left ventricular systolic function is normal. LV ejection fraction  = 55-60%. Left ventricular filling pattern is indeterminate. The left  ventricular wall motion is normal. LV WALL MOTION -  RIGHT VENTRICLE The right ventricle is normal in size and function. The right ventricular  systolic function is normal. The right ventricular wall motion is normal.  LEFT ATRIUM The left atrium is borderline dilated.  RIGHT ATRIUM  Right atrial size is normal. - AORTIC VALVE The aortic valve is normal in structure and function. The aortic valve opens  well. Trace  (trivial) aortic regurgitation. - MITRAL VALVE The mitral valve leaflets appear normal. There is no evidence of stenosis,  fluttering, or prolapse. There is mild mitral regurgitation. - TRICUSPID VALVE Structurally normal tricuspid valve. There is mild tricuspid regurgitation. - PULMONIC VALVE The pulmonic valve is normal in structure and function. Trace pulmonic  valvular regurgitation. - ARTERIES The aortic root is normal size. - VENOUS Pulmonary venous flow pattern is blunted. IVC size was normal. - EFFUSION There is no pericardial effusion. - - MMode/2D Measurements & Calculations IVSd: 0.95 cm LVIDd: 3.9 cm LVPWd: 0.99 cm LVIDs: 2.4 cm LA dim: 3.8 cm Ao root: 2.5 cm EDV(MOD-sp4): 24.0 ml ESV(MOD-sp4): 11.0 ml LVOT diam: 1.5 cm LAV(MOD-sp4): 41.5 ml Doppler Measurements & Calculations MV E max vel: 114.0 cm/sec MV A max vel: 82.4 cm/sec MV E/A: 1.4 Med Peak E' Vel: 7.6 cm/sec Lat Peak E' Vel: 12.2 cm/sec E/Lat E`: 9.4 E/Med E`: 15.0 MV dec time: 0.16 sec SV(LVOT): 45.4 ml Ao mean PG: 7.1 mmHg AVA (VTI): 1.3 cm2 LV V1 VTI: 27.2 cm TR max vel: 230.8 cm/sec TR max PG: 21.3 mmHg RVSP(TR): 26.3 mmHg RAP systole: 5.0 mmHg    11/2014 echo Study Conclusions  - Left ventricle: The cavity size was normal. Systolic function was   normal. The estimated ejection fraction was in the range of 60%   to 65%. Wall motion was normal; there were no regional wall   motion abnormalities. Diastolic dysfunction, grade indeterminate.   Medial annular velocity is slightly low with elevated E/e&' ratio   of 18, indicative of elevated filling pressures. Mild to moderate   concentric left ventricular hypertrophy. - Aortic valve: Moderately calcified annulus. Trileaflet. There was   mild regurgitation. - Mitral valve: Mildly calcified annulus. Mildly thickened leaflets   . There was trivial regurgitation. - Right ventricle: Systolic function was normal. TAPSE: 17.6 mm . -  Tricuspid valve: There was mild-moderate regurgitation. - Pulmonary arteries: Systolic pressure was mildly increased. PA   peak pressure: 35 mm Hg (S).     12/2014 Lexiscan MPI No diagnostic ST segment abnormalities. Rare PACs. Very small fixed perfusion defect at the inferior apex most consistent  with attenuation artifact rather than scar. No ischemic findings. This is a low risk study. Nuclear stress EF: 70%.   08/2016 echo Study Conclusions   - Left ventricle: The cavity size was normal. Wall thickness was   normal. Systolic function was normal. The estimated ejection   fraction was in the range of 60% to 65%. Features are consistent   with a pseudonormal left ventricular filling pattern, with   concomitant abnormal relaxation and increased filling pressure   (grade 2 diastolic dysfunction). Doppler parameters are   consistent with high ventricular filling pressure. - Aortic valve: Moderately calcified annulus. Trileaflet. There was   mild regurgitation. - Mitral valve: Calcified annulus. There was mild regurgitation. - Right ventricle: Systolic function was mildly to moderately   reduced.     09/2020 echo IMPRESSIONS     1. Left ventricular ejection fraction, by estimation, is 60 to 65%. The  left ventricle has normal function. The left ventricle has no regional  wall motion abnormalities. Left ventricular diastolic parameters are  consistent with Grade I diastolic  dysfunction (impaired relaxation).   2. Right ventricular systolic function is normal. The right ventricular  size is normal. There is normal pulmonary artery systolic pressure.   3. The mitral valve is normal in structure. Trivial mitral valve  regurgitation. No evidence of mitral stenosis.   4. The aortic valve is calcified. There is moderate calcification of the  aortic valve. There is moderate thickening of the aortic valve. Aortic  valve regurgitation is trivial. Mild to moderate aortic valve   sclerosis/calcification is present, without any   evidence of aortic stenosis.   5. The inferior vena cava is normal in size with greater than 50%  respiratory variability, suggesting right atrial pressure of 3 mmHg.        Assessment and Plan   1. CAD - no ACE/ARB due to renal dysfunction - . On plavix due to recurrent CVAs, fine from cardiac standpoint as well - denies any symptoms, continuec urrent meds     2. Hyperlipidemia -very high risk given CAD, PAD, multiple CVAs and , thus LDL goal would be <55 -had to d/c high dose crestor due to renal function, back on atorva 80mg .  - request pcp labs, may need zetia at some point pending LDL   3. HTN -bp is at goal, continue current meds       Antoine Poche, M.D.

## 2022-06-28 DIAGNOSIS — E1142 Type 2 diabetes mellitus with diabetic polyneuropathy: Secondary | ICD-10-CM | POA: Diagnosis not present

## 2022-06-28 DIAGNOSIS — L84 Corns and callosities: Secondary | ICD-10-CM | POA: Diagnosis not present

## 2022-06-28 DIAGNOSIS — L603 Nail dystrophy: Secondary | ICD-10-CM | POA: Diagnosis not present

## 2022-07-02 ENCOUNTER — Ambulatory Visit (INDEPENDENT_AMBULATORY_CARE_PROVIDER_SITE_OTHER): Payer: Medicare Other

## 2022-07-02 DIAGNOSIS — I639 Cerebral infarction, unspecified: Secondary | ICD-10-CM | POA: Diagnosis not present

## 2022-07-02 LAB — CUP PACEART REMOTE DEVICE CHECK
Date Time Interrogation Session: 20240609230416
Implantable Pulse Generator Implant Date: 20220926

## 2022-07-12 DIAGNOSIS — Z9641 Presence of insulin pump (external) (internal): Secondary | ICD-10-CM | POA: Diagnosis not present

## 2022-07-12 DIAGNOSIS — Z7952 Long term (current) use of systemic steroids: Secondary | ICD-10-CM | POA: Diagnosis not present

## 2022-07-12 DIAGNOSIS — E039 Hypothyroidism, unspecified: Secondary | ICD-10-CM | POA: Diagnosis not present

## 2022-07-12 DIAGNOSIS — E271 Primary adrenocortical insufficiency: Secondary | ICD-10-CM | POA: Diagnosis not present

## 2022-07-12 DIAGNOSIS — E1040 Type 1 diabetes mellitus with diabetic neuropathy, unspecified: Secondary | ICD-10-CM | POA: Diagnosis not present

## 2022-07-12 DIAGNOSIS — N1832 Chronic kidney disease, stage 3b: Secondary | ICD-10-CM | POA: Diagnosis not present

## 2022-07-12 DIAGNOSIS — E1065 Type 1 diabetes mellitus with hyperglycemia: Secondary | ICD-10-CM | POA: Diagnosis not present

## 2022-07-12 DIAGNOSIS — E1059 Type 1 diabetes mellitus with other circulatory complications: Secondary | ICD-10-CM | POA: Diagnosis not present

## 2022-07-12 DIAGNOSIS — E1022 Type 1 diabetes mellitus with diabetic chronic kidney disease: Secondary | ICD-10-CM | POA: Diagnosis not present

## 2022-07-12 DIAGNOSIS — Z951 Presence of aortocoronary bypass graft: Secondary | ICD-10-CM | POA: Diagnosis not present

## 2022-07-12 DIAGNOSIS — Z79890 Hormone replacement therapy (postmenopausal): Secondary | ICD-10-CM | POA: Diagnosis not present

## 2022-07-23 NOTE — Progress Notes (Signed)
Carelink Summary Report / Loop Recorder 

## 2022-08-01 DIAGNOSIS — E1021 Type 1 diabetes mellitus with diabetic nephropathy: Secondary | ICD-10-CM | POA: Diagnosis not present

## 2022-08-01 DIAGNOSIS — R030 Elevated blood-pressure reading, without diagnosis of hypertension: Secondary | ICD-10-CM | POA: Diagnosis not present

## 2022-08-01 DIAGNOSIS — N184 Chronic kidney disease, stage 4 (severe): Secondary | ICD-10-CM | POA: Diagnosis not present

## 2022-08-01 DIAGNOSIS — Z6821 Body mass index (BMI) 21.0-21.9, adult: Secondary | ICD-10-CM | POA: Diagnosis not present

## 2022-08-01 DIAGNOSIS — R5383 Other fatigue: Secondary | ICD-10-CM | POA: Diagnosis not present

## 2022-08-01 DIAGNOSIS — E875 Hyperkalemia: Secondary | ICD-10-CM | POA: Diagnosis not present

## 2022-08-08 DIAGNOSIS — N390 Urinary tract infection, site not specified: Secondary | ICD-10-CM | POA: Diagnosis not present

## 2022-08-09 DIAGNOSIS — I10 Essential (primary) hypertension: Secondary | ICD-10-CM | POA: Diagnosis not present

## 2022-08-09 DIAGNOSIS — N184 Chronic kidney disease, stage 4 (severe): Secondary | ICD-10-CM | POA: Diagnosis not present

## 2022-08-09 DIAGNOSIS — E1021 Type 1 diabetes mellitus with diabetic nephropathy: Secondary | ICD-10-CM | POA: Diagnosis not present

## 2022-08-09 DIAGNOSIS — E039 Hypothyroidism, unspecified: Secondary | ICD-10-CM | POA: Diagnosis not present

## 2022-08-09 DIAGNOSIS — E875 Hyperkalemia: Secondary | ICD-10-CM | POA: Diagnosis not present

## 2022-08-09 DIAGNOSIS — E7849 Other hyperlipidemia: Secondary | ICD-10-CM | POA: Diagnosis not present

## 2022-08-31 DIAGNOSIS — D649 Anemia, unspecified: Secondary | ICD-10-CM | POA: Diagnosis not present

## 2022-08-31 DIAGNOSIS — E782 Mixed hyperlipidemia: Secondary | ICD-10-CM | POA: Diagnosis not present

## 2022-08-31 DIAGNOSIS — E559 Vitamin D deficiency, unspecified: Secondary | ICD-10-CM | POA: Diagnosis not present

## 2022-08-31 DIAGNOSIS — E2749 Other adrenocortical insufficiency: Secondary | ICD-10-CM | POA: Diagnosis not present

## 2022-08-31 DIAGNOSIS — E1121 Type 2 diabetes mellitus with diabetic nephropathy: Secondary | ICD-10-CM | POA: Diagnosis not present

## 2022-08-31 DIAGNOSIS — E875 Hyperkalemia: Secondary | ICD-10-CM | POA: Diagnosis not present

## 2022-08-31 DIAGNOSIS — N189 Chronic kidney disease, unspecified: Secondary | ICD-10-CM | POA: Diagnosis not present

## 2022-08-31 DIAGNOSIS — R300 Dysuria: Secondary | ICD-10-CM | POA: Diagnosis not present

## 2022-08-31 DIAGNOSIS — I5032 Chronic diastolic (congestive) heart failure: Secondary | ICD-10-CM | POA: Diagnosis not present

## 2022-08-31 DIAGNOSIS — E7849 Other hyperlipidemia: Secondary | ICD-10-CM | POA: Diagnosis not present

## 2022-08-31 DIAGNOSIS — I10 Essential (primary) hypertension: Secondary | ICD-10-CM | POA: Diagnosis not present

## 2022-08-31 DIAGNOSIS — E038 Other specified hypothyroidism: Secondary | ICD-10-CM | POA: Diagnosis not present

## 2022-08-31 DIAGNOSIS — D519 Vitamin B12 deficiency anemia, unspecified: Secondary | ICD-10-CM | POA: Diagnosis not present

## 2022-08-31 DIAGNOSIS — R5383 Other fatigue: Secondary | ICD-10-CM | POA: Diagnosis not present

## 2022-09-05 DIAGNOSIS — E039 Hypothyroidism, unspecified: Secondary | ICD-10-CM | POA: Diagnosis not present

## 2022-09-05 DIAGNOSIS — Z6822 Body mass index (BMI) 22.0-22.9, adult: Secondary | ICD-10-CM | POA: Diagnosis not present

## 2022-09-05 DIAGNOSIS — N184 Chronic kidney disease, stage 4 (severe): Secondary | ICD-10-CM | POA: Diagnosis not present

## 2022-09-05 DIAGNOSIS — E7849 Other hyperlipidemia: Secondary | ICD-10-CM | POA: Diagnosis not present

## 2022-09-05 DIAGNOSIS — R030 Elevated blood-pressure reading, without diagnosis of hypertension: Secondary | ICD-10-CM | POA: Diagnosis not present

## 2022-09-05 DIAGNOSIS — E1021 Type 1 diabetes mellitus with diabetic nephropathy: Secondary | ICD-10-CM | POA: Diagnosis not present

## 2022-09-05 DIAGNOSIS — I639 Cerebral infarction, unspecified: Secondary | ICD-10-CM | POA: Diagnosis not present

## 2022-09-05 DIAGNOSIS — E875 Hyperkalemia: Secondary | ICD-10-CM | POA: Diagnosis not present

## 2022-09-06 DIAGNOSIS — L84 Corns and callosities: Secondary | ICD-10-CM | POA: Diagnosis not present

## 2022-09-06 DIAGNOSIS — L603 Nail dystrophy: Secondary | ICD-10-CM | POA: Diagnosis not present

## 2022-09-06 DIAGNOSIS — E1142 Type 2 diabetes mellitus with diabetic polyneuropathy: Secondary | ICD-10-CM | POA: Diagnosis not present

## 2022-09-10 DIAGNOSIS — Z1231 Encounter for screening mammogram for malignant neoplasm of breast: Secondary | ICD-10-CM | POA: Diagnosis not present

## 2022-10-02 DIAGNOSIS — E063 Autoimmune thyroiditis: Secondary | ICD-10-CM | POA: Diagnosis not present

## 2022-10-02 DIAGNOSIS — E038 Other specified hypothyroidism: Secondary | ICD-10-CM | POA: Diagnosis not present

## 2022-10-02 DIAGNOSIS — E271 Primary adrenocortical insufficiency: Secondary | ICD-10-CM | POA: Diagnosis not present

## 2022-10-02 DIAGNOSIS — E1065 Type 1 diabetes mellitus with hyperglycemia: Secondary | ICD-10-CM | POA: Diagnosis not present

## 2022-10-02 DIAGNOSIS — E039 Hypothyroidism, unspecified: Secondary | ICD-10-CM | POA: Diagnosis not present

## 2022-10-02 DIAGNOSIS — Z4681 Encounter for fitting and adjustment of insulin pump: Secondary | ICD-10-CM | POA: Diagnosis not present

## 2022-10-02 DIAGNOSIS — E10649 Type 1 diabetes mellitus with hypoglycemia without coma: Secondary | ICD-10-CM | POA: Diagnosis not present

## 2022-10-22 DIAGNOSIS — I10 Essential (primary) hypertension: Secondary | ICD-10-CM | POA: Diagnosis not present

## 2022-10-22 DIAGNOSIS — E1021 Type 1 diabetes mellitus with diabetic nephropathy: Secondary | ICD-10-CM | POA: Diagnosis not present

## 2022-10-22 DIAGNOSIS — E782 Mixed hyperlipidemia: Secondary | ICD-10-CM | POA: Diagnosis not present

## 2022-10-22 DIAGNOSIS — E875 Hyperkalemia: Secondary | ICD-10-CM | POA: Diagnosis not present

## 2022-10-31 DIAGNOSIS — H353232 Exudative age-related macular degeneration, bilateral, with inactive choroidal neovascularization: Secondary | ICD-10-CM | POA: Diagnosis not present

## 2022-10-31 DIAGNOSIS — H353133 Nonexudative age-related macular degeneration, bilateral, advanced atrophic without subfoveal involvement: Secondary | ICD-10-CM | POA: Diagnosis not present

## 2022-10-31 DIAGNOSIS — E113553 Type 2 diabetes mellitus with stable proliferative diabetic retinopathy, bilateral: Secondary | ICD-10-CM | POA: Diagnosis not present

## 2022-10-31 DIAGNOSIS — Z794 Long term (current) use of insulin: Secondary | ICD-10-CM | POA: Diagnosis not present

## 2022-10-31 DIAGNOSIS — H35373 Puckering of macula, bilateral: Secondary | ICD-10-CM | POA: Diagnosis not present

## 2022-11-13 DIAGNOSIS — Z23 Encounter for immunization: Secondary | ICD-10-CM | POA: Diagnosis not present

## 2022-11-15 ENCOUNTER — Other Ambulatory Visit: Payer: Self-pay | Admitting: Cardiology

## 2022-11-20 DIAGNOSIS — L603 Nail dystrophy: Secondary | ICD-10-CM | POA: Diagnosis not present

## 2022-11-20 DIAGNOSIS — L84 Corns and callosities: Secondary | ICD-10-CM | POA: Diagnosis not present

## 2022-11-20 DIAGNOSIS — E1142 Type 2 diabetes mellitus with diabetic polyneuropathy: Secondary | ICD-10-CM | POA: Diagnosis not present

## 2022-12-24 DIAGNOSIS — E063 Autoimmune thyroiditis: Secondary | ICD-10-CM | POA: Diagnosis not present

## 2022-12-24 DIAGNOSIS — E271 Primary adrenocortical insufficiency: Secondary | ICD-10-CM | POA: Diagnosis not present

## 2022-12-24 DIAGNOSIS — E10649 Type 1 diabetes mellitus with hypoglycemia without coma: Secondary | ICD-10-CM | POA: Diagnosis not present

## 2022-12-25 DIAGNOSIS — I10 Essential (primary) hypertension: Secondary | ICD-10-CM | POA: Diagnosis not present

## 2022-12-25 DIAGNOSIS — E1022 Type 1 diabetes mellitus with diabetic chronic kidney disease: Secondary | ICD-10-CM | POA: Diagnosis not present

## 2022-12-25 DIAGNOSIS — D649 Anemia, unspecified: Secondary | ICD-10-CM | POA: Diagnosis not present

## 2022-12-25 DIAGNOSIS — N1832 Chronic kidney disease, stage 3b: Secondary | ICD-10-CM | POA: Diagnosis not present

## 2022-12-25 DIAGNOSIS — E875 Hyperkalemia: Secondary | ICD-10-CM | POA: Diagnosis not present

## 2022-12-25 DIAGNOSIS — I129 Hypertensive chronic kidney disease with stage 1 through stage 4 chronic kidney disease, or unspecified chronic kidney disease: Secondary | ICD-10-CM | POA: Diagnosis not present

## 2023-01-02 ENCOUNTER — Ambulatory Visit: Payer: Medicare Other | Attending: Cardiology | Admitting: Cardiology

## 2023-01-02 ENCOUNTER — Encounter: Payer: Self-pay | Admitting: Cardiology

## 2023-01-02 VITALS — BP 108/60 | HR 73 | Ht 62.0 in | Wt 122.6 lb

## 2023-01-02 DIAGNOSIS — I2510 Atherosclerotic heart disease of native coronary artery without angina pectoris: Secondary | ICD-10-CM

## 2023-01-02 DIAGNOSIS — E782 Mixed hyperlipidemia: Secondary | ICD-10-CM | POA: Diagnosis not present

## 2023-01-02 DIAGNOSIS — I10 Essential (primary) hypertension: Secondary | ICD-10-CM

## 2023-01-02 NOTE — Progress Notes (Signed)
Clinical Summary Ms. Moore is a 72 y.o.female seen today for follow up of the following medical problems.       1. CAD -  previously followed by [facility]. 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   - LVEF March 2011 reported normal LVEF. Echo 04/2011 LVEF 55-60%. - 12/2014 Lexiscan without ischemia - 08/2016 echo LVEF 60-65%, grade II diastolic dysfunction - 09/2020 LVEF 60-65%, grade I,      -currerntly on plavix per neuro, we have deferred antiplatelet agents to neurology given recurrent CVAs   - no chest pains, no SOB/DOE - compliant with meds   2. PAD - hx of right common femoral to popliteal bypass in [place] July 2008 - followed by vascular     05/2020 stents to bilateral common iliac arteries - last seen 12/2021 with plans for 1 year f/u   3. 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 Adrenal insufficiency - followed by endocrine     6. CKD - followed at [facility]     7. Chronic diastolic HF - echo 08/2016 LVEF 60-65%, grade II diastolic dysfunction. Mild to moderate RV dysfunction. PASP 23.  - no recent issues with edema, actually on florinef for adrenal insufficiency history.    - no recent edmea.    8. CVA - admit 09/2020 with CVA - was discharged with loop recorder - 04/2021 loop recorder check was benign. Loop removed 05/2022 due to discomfort   - she is plavix alone perneuro     9. HTN - home bp's 110s-120s/60s   - renal held norvasc and started indapamide during 11/28/21 visit - defer further changes to neprhology.     Past Medical History:  Diagnosis Date   Cancer (HCC)    Squamous Cell   Diabetes  mellitus without complication (HCC)    Hyperlipemia    Hypertension    Hypothyroidism    PAD (peripheral artery disease) (HCC)    Stroke (HCC)      Allergies  Allergen Reactions   Bactrim [Sulfamethoxazole-Trimethoprim] Other (See Comments)    Elevates potassium.    Tape Other (See Comments)    not allergic but causes sensitivity.   Epoetin Alfa Rash   Fluorescein Rash   Procrit [Epoetin (Alfa)] Rash     Current Outpatient Medications  Medication Sig Dispense Refill   acetaminophen (TYLENOL) 500 MG tablet Take 500 mg by mouth every 6 (six) hours as needed for mild pain or headache.      ascorbic Acid (VITAMIN C) 500 MG CPCR Take 500 mg by mouth daily.     atorvastatin (LIPITOR) 80 MG tablet TAKE 1 TABLET BY MOUTH EVERY DAY 90 tablet 1   Calcium Carbonate (CALCIUM 600 PO) Take 600 mg by mouth every morning.     Cholecalciferol (VITAMIN D3) 50 MCG (2000 UT) TABS Take 2,000 Units by mouth daily.     clopidogrel (PLAVIX) 75 MG tablet TAKE 1 TABLET BY MOUTH DAILY 90 tablet 3   fludrocortisone (FLORINEF) 0.1 MG tablet Take 0.05 mg  by mouth daily.     HUMALOG 100 UNIT/ML injection Inject into the skin See admin instructions. VIA INSULIN PUMP     indapamide (LOZOL) 1.25 MG tablet Take by mouth.     Insulin Human (INSULIN PUMP) SOLN Inject into the skin. HUMALOG 100 UNIT/ML     iron polysaccharides (NIFEREX) 150 MG capsule Take 150 mg by mouth at bedtime.      levothyroxine (SYNTHROID) 100 MCG tablet Take 100 mcg by mouth daily.     magnesium chloride (SLOW-MAG) 64 MG TBEC SR tablet Take 1-2 tablets by mouth See admin instructions. TAKE 1 TABLET IN THE MORNING & TAKE 2 TABLETS AT NIGHT.     Multiple Vitamin (MULTIVITAMIN WITH MINERALS) TABS tablet Take 1 tablet by mouth daily.     Multiple Vitamins-Minerals (PRESERVISION AREDS 2 PO) Take 1 tablet by mouth daily.      Omega-3 Fatty Acids (FISH OIL) 1200 MG CAPS Take 1,200 mg by mouth daily.     predniSONE (DELTASONE) 5 MG tablet Take 5  mg by mouth daily with breakfast.     VELTASSA 8.4 g packet Take 8.4 g by mouth once a week.     No current facility-administered medications for this visit.     Past Surgical History:  Procedure Laterality Date   ABDOMINAL AORTOGRAM W/LOWER EXTREMITY N/A 06/09/2019   Procedure: ABDOMINAL AORTOGRAM W/LOWER EXTREMITY;  Surgeon: Nada Libman, MD;  Location: [facility] INVASIVE CV LAB;  Service: Cardiovascular;  Laterality: N/A;   APPENDECTOMY     CATARACT EXTRACTION Bilateral    CORONARY ARTERY BYPASS GRAFT     LOOP RECORDER INSERTION N/A 10/17/2020   Procedure: LOOP RECORDER INSERTION;  Surgeon: Duke Salvia, MD;  Location: [facility] INVASIVE CV LAB;  Service: Cardiovascular;  Laterality: N/A;   PERIPHERAL VASCULAR INTERVENTION Bilateral 06/09/2019   Procedure: PERIPHERAL VASCULAR INTERVENTION;  Surgeon: Nada Libman, MD;  Location: [facility] INVASIVE CV LAB;  Service: Cardiovascular;  Laterality: Bilateral;   TONSILLECTOMY     YAG LASER APPLICATION Right 05/11/2013   Procedure: YAG LASER APPLICATION;  Surgeon: Susa Simmonds, MD;  Location: [facility];  Service: Ophthalmology;  Laterality: Right;     Allergies  Allergen Reactions   Bactrim [Sulfamethoxazole-Trimethoprim] Other (See Comments)    Elevates potassium.    Tape Other (See Comments)    not allergic but causes sensitivity.   Epoetin Alfa Rash   Fluorescein Rash   Procrit [Epoetin (Alfa)] Rash      Family History  Problem Relation Age of Onset   Heart disease Mother    Hypertension Mother    Hypertension Father    Sleep apnea Neg Hx      Social History Ms. Kady reports that she has never smoked. She has never used smokeless tobacco. Ms. Schenk reports no history of alcohol use.   Review of Systems CONSTITUTIONAL: No weight loss, fever, chills, weakness or fatigue.  HEENT: Eyes: No visual loss, blurred vision, double vision or yellow sclerae.No hearing loss, sneezing, congestion, runny nose or sore throat.  SKIN: No rash  or itching.  CARDIOVASCULAR: per hpi RESPIRATORY: No shortness of breath, cough or sputum.  GASTROINTESTINAL: No anorexia, nausea, vomiting or diarrhea. No abdominal pain or blood.  GENITOURINARY: No burning on urination, no polyuria NEUROLOGICAL: No headache, dizziness, syncope, paralysis, ataxia, numbness or tingling in the extremities. No change in bowel or bladder control.  MUSCULOSKELETAL: No muscle, back pain, joint pain or stiffness.  LYMPHATICS: No enlarged nodes. No history of splenectomy.  PSYCHIATRIC: No history of depression or anxiety.  ENDOCRINOLOGIC: No reports of sweating, cold or heat intolerance. No polyuria or polydipsia.  Marland Kitchen   Physical Examination Today's Vitals   01/02/23 1050  BP: 108/60  Pulse: 73  SpO2: 98%  Weight: 122 lb 9.6 oz (55.6 kg)  Height: 5\' 2"  (1.575 m)   Body mass index is 22.42 kg/m.  Gen: resting comfortably, no acute distress HEENT: no scleral icterus, pupils equal round and reactive, no palptable cervical adenopathy,  CV: RRR, no m/rg, no jvd Resp: Clear to auscultation bilaterally GI: abdomen is soft, non-tender, non-distended, normal bowel sounds, no hepatosplenomegaly MSK: extremities are warm, no edema.  Skin: warm, no rash Neuro:  no focal deficits Psych: appropriate affect   Diagnostic Studies 04/2011 Echo FINDINGS:  LEFT VENTRICLE The left ventricular size is normal. There is normal left ventricular wall  thickness. Left ventricular systolic function is normal. LV ejection fraction  = 55-60%. Left ventricular filling pattern is indeterminate. The left  ventricular wall motion is normal. LV WALL MOTION -  RIGHT VENTRICLE The right ventricle is normal in size and function. The right ventricular  systolic function is normal. The right ventricular wall motion is normal.  LEFT ATRIUM The left atrium is borderline dilated.  RIGHT ATRIUM  Right atrial size is normal. - AORTIC VALVE The aortic valve is normal in  structure and function. The aortic valve opens  well. Trace (trivial) aortic regurgitation. - MITRAL VALVE The mitral valve leaflets appear normal. There is no evidence of stenosis,  fluttering, or prolapse. There is mild mitral regurgitation. - TRICUSPID VALVE Structurally normal tricuspid valve. There is mild tricuspid regurgitation. - PULMONIC VALVE The pulmonic valve is normal in structure and function. Trace pulmonic  valvular regurgitation. - ARTERIES The aortic root is normal size. - VENOUS Pulmonary venous flow pattern is blunted. IVC size was normal. - EFFUSION There is no pericardial effusion. - - MMode/2D Measurements & Calculations IVSd: 0.95 cm LVIDd: 3.9 cm LVPWd: 0.99 cm LVIDs: 2.4 cm LA dim: 3.8 cm Ao root: 2.5 cm EDV(MOD-sp4): 24.0 ml ESV(MOD-sp4): 11.0 ml LVOT diam: 1.5 cm LAV(MOD-sp4): 41.5 ml Doppler Measurements & Calculations MV E max vel: 114.0 cm/sec MV A max vel: 82.4 cm/sec MV E/A: 1.4 Med Peak E' Vel: 7.6 cm/sec Lat Peak E' Vel: 12.2 cm/sec E/Lat E`: 9.4 E/Med E`: 15.0 MV dec time: 0.16 sec SV(LVOT): 45.4 ml Ao mean PG: 7.1 mmHg AVA (VTI): 1.3 cm2 LV V1 VTI: 27.2 cm TR max vel: 230.8 cm/sec TR max PG: 21.3 mmHg RVSP(TR): 26.3 mmHg RAP systole: 5.0 mmHg    11/2014 echo Study Conclusions  - Left ventricle: The cavity size was normal. Systolic function was   normal. The estimated ejection fraction was in the range of 60%   to 65%. Wall motion was normal; there were no regional wall   motion abnormalities. Diastolic dysfunction, grade indeterminate.   Medial annular velocity is slightly low with elevated E/e&' ratio   of 18, indicative of elevated filling pressures. Mild to moderate   concentric left ventricular hypertrophy. - Aortic valve: Moderately calcified annulus. Trileaflet. There was   mild regurgitation. - Mitral valve: Mildly calcified annulus. Mildly thickened leaflets   . There was trivial regurgitation. - Right  ventricle: Systolic function was normal. TAPSE: 17.6 mm . - Tricuspid valve: There was mild-moderate regurgitation. - Pulmonary arteries: Systolic pressure was mildly increased. PA   peak pressure: 35 mm Hg (S).     12/2014 Lexiscan MPI No diagnostic  ST segment abnormalities. Rare PACs. Very small fixed perfusion defect at the inferior apex most consistent with attenuation artifact rather than scar. No ischemic findings. This is a low risk study. Nuclear stress EF: 70%.   08/2016 echo Study Conclusions   - Left ventricle: The cavity size was normal. Wall thickness was   normal. Systolic function was normal. The estimated ejection   fraction was in the range of 60% to 65%. Features are consistent   with a pseudonormal left ventricular filling pattern, with   concomitant abnormal relaxation and increased filling pressure   (grade 2 diastolic dysfunction). Doppler parameters are   consistent with high ventricular filling pressure. - Aortic valve: Moderately calcified annulus. Trileaflet. There was   mild regurgitation. - Mitral valve: Calcified annulus. There was mild regurgitation. - Right ventricle: Systolic function was mildly to moderately   reduced.     09/2020 echo IMPRESSIONS     1. Left ventricular ejection fraction, by estimation, is 60 to 65%. The  left ventricle has normal function. The left ventricle has no regional  wall motion abnormalities. Left ventricular diastolic parameters are  consistent with Grade I diastolic  dysfunction (impaired relaxation).   2. Right ventricular systolic function is normal. The right ventricular  size is normal. There is normal pulmonary artery systolic pressure.   3. The mitral valve is normal in structure. Trivial mitral valve  regurgitation. No evidence of mitral stenosis.   4. The aortic valve is calcified. There is moderate calcification of the  aortic valve. There is moderate thickening of the aortic valve. Aortic  valve  regurgitation is trivial. Mild to moderate aortic valve  sclerosis/calcification is present, without any   evidence of aortic stenosis.   5. The inferior vena cava is normal in size with greater than 50%  respiratory variability, suggesting right atrial pressure of 3 mmHg.       Assessment and Plan  1. CAD - no ACE/ARB due to renal dysfunction - . On plavix due to recurrent CVAs, fine from cardiac standpoint as well - no recent symptoms, continue current meds     2. Hyperlipidemia -very high risk given CAD, PAD, multiple CVAs and , thus LDL goal would be <55 -had to d/c high dose crestor due to renal function, back on atorva 80mg  with zetia 10mg  daily - f/u upcomnig pcp labs   3. HTN - she is at goal, continue current meds  F/u 6 months      Antoine Poche, M.D.

## 2023-01-02 NOTE — Patient Instructions (Signed)
Medication Instructions:  Continue all current medications.   Labwork: none  Testing/Procedures: none  Follow-Up: 6 months   Any Other Special Instructions Will Be Listed Below (If Applicable).   If you need a refill on your cardiac medications before your next appointment, please call your pharmacy.  

## 2023-01-03 ENCOUNTER — Encounter: Payer: Self-pay | Admitting: *Deleted

## 2023-01-08 ENCOUNTER — Other Ambulatory Visit: Payer: Self-pay | Admitting: *Deleted

## 2023-01-08 DIAGNOSIS — Z95828 Presence of other vascular implants and grafts: Secondary | ICD-10-CM

## 2023-01-08 DIAGNOSIS — I779 Disorder of arteries and arterioles, unspecified: Secondary | ICD-10-CM

## 2023-01-08 DIAGNOSIS — I70213 Atherosclerosis of native arteries of extremities with intermittent claudication, bilateral legs: Secondary | ICD-10-CM

## 2023-01-10 DIAGNOSIS — E875 Hyperkalemia: Secondary | ICD-10-CM | POA: Diagnosis not present

## 2023-01-10 DIAGNOSIS — I10 Essential (primary) hypertension: Secondary | ICD-10-CM | POA: Diagnosis not present

## 2023-01-10 DIAGNOSIS — N1832 Chronic kidney disease, stage 3b: Secondary | ICD-10-CM | POA: Diagnosis not present

## 2023-01-14 ENCOUNTER — Ambulatory Visit (INDEPENDENT_AMBULATORY_CARE_PROVIDER_SITE_OTHER)
Admission: RE | Admit: 2023-01-14 | Discharge: 2023-01-14 | Discharge status: Home / Self Care | Payer: Medicare Other | Source: Ambulatory Visit | Attending: Surgery | Admitting: Surgery

## 2023-01-14 ENCOUNTER — Ambulatory Visit (HOSPITAL_COMMUNITY)
Admission: RE | Admit: 2023-01-14 | Discharge: 2023-01-14 | Disposition: A | Discharge status: Home / Self Care | Payer: Medicare Other | Source: Ambulatory Visit | Attending: Surgery | Admitting: Surgery

## 2023-01-14 ENCOUNTER — Ambulatory Visit (INDEPENDENT_AMBULATORY_CARE_PROVIDER_SITE_OTHER): Payer: Medicare Other

## 2023-01-14 VITALS — BP 150/73 | HR 66 | Temp 97.8°F | Resp 20 | Ht 62.0 in | Wt 123.7 lb

## 2023-01-14 DIAGNOSIS — I779 Disorder of arteries and arterioles, unspecified: Secondary | ICD-10-CM

## 2023-01-14 DIAGNOSIS — Z95828 Presence of other vascular implants and grafts: Secondary | ICD-10-CM | POA: Insufficient documentation

## 2023-01-14 DIAGNOSIS — I70213 Atherosclerosis of native arteries of extremities with intermittent claudication, bilateral legs: Secondary | ICD-10-CM

## 2023-01-14 LAB — VAS US ABI WITH/WO TBI: Right ABI: 1.02

## 2023-01-14 NOTE — Progress Notes (Signed)
HISTORY AND PHYSICAL     CC:  follow up. Requesting Provider:  Juliette Alcide, MD  HPI: This is a 72 y.o. female who is here today for follow up for PAD.  Pt has hx of right femoral to above-the-knee popliteal bypass with vein in 2008 in [place]. She has also had bilateral common iliac stenting by Dr. Myra Gianotti in May 2021 due to disabling claudication.   Pt was last seen 01/08/2022 and at that time, she was not having any rest pain, claudication or tissue loss.    The pt returns today for follow up and here with her husband Reita Cliche of 47 years.  She denies any claudication or rest pain.  She walks the dogs and shops.  She does not have to stop due to her legs.  She gets an occasional cramp in her foot at night.  She does have a wound on the right lower leg that has been present for a couple of days.  She states that she protects her feet as she does have decreased sensation in her feet.   She states that she is going to [facility] for her CKD III.  Her last creatinine on 01/10/2023 was 1.91.   She states that after her iliac stenting in 2021, she was hospitalized for 4 days after developing a hematoma.     The pt is on a statin for cholesterol management.    The pt is not on an aspirin.    Other AC:  Plavix The pt is not on medication for hypertension.  The pt is  on medication for diabetes. Tobacco hx:  never  Pt does not have family hx of AAA.  Past Medical History:  Diagnosis Date   Cancer (HCC)    Squamous Cell   Diabetes mellitus without complication (HCC)    Hyperlipemia    Hypertension    Hypothyroidism    PAD (peripheral artery disease) (HCC)    Stroke [facility])     Past Surgical History:  Procedure Laterality Date   ABDOMINAL AORTOGRAM W/LOWER EXTREMITY N/A 06/09/2019   Procedure: ABDOMINAL AORTOGRAM W/LOWER EXTREMITY;  Surgeon: Nada Libman, MD;  Location: [facility] INVASIVE CV LAB;  Service: Cardiovascular;  Laterality: N/A;   APPENDECTOMY     CATARACT  EXTRACTION Bilateral    CORONARY ARTERY BYPASS GRAFT     LOOP RECORDER INSERTION N/A 10/17/2020   Procedure: LOOP RECORDER INSERTION;  Surgeon: Duke Salvia, MD;  Location: [facility] INVASIVE CV LAB;  Service: Cardiovascular;  Laterality: N/A;   PERIPHERAL VASCULAR INTERVENTION Bilateral 06/09/2019   Procedure: PERIPHERAL VASCULAR INTERVENTION;  Surgeon: Nada Libman, MD;  Location: [facility] INVASIVE CV LAB;  Service: Cardiovascular;  Laterality: Bilateral;   TONSILLECTOMY     YAG LASER APPLICATION Right 05/11/2013   Procedure: YAG LASER APPLICATION;  Surgeon: Susa Simmonds, MD;  Location: [facility]S;  Service: Ophthalmology;  Laterality: Right;    Allergies  Allergen Reactions   Bactrim [Sulfamethoxazole-Trimethoprim] Other (See Comments)    Elevates potassium.    Tape Other (See Comments)    not allergic but causes sensitivity.   Epoetin Alfa Rash   Fluorescein Rash   Procrit [Epoetin (Alfa)] Rash    Current Outpatient Medications  Medication Sig Dispense Refill   acetaminophen (TYLENOL) 500 MG tablet Take 500 mg by mouth every 6 (six) hours as needed for mild pain or headache.      ascorbic Acid (VITAMIN C) 500 MG CPCR Take 500 mg by mouth daily.  atorvastatin (LIPITOR) 80 MG tablet TAKE 1 TABLET BY MOUTH EVERY DAY 90 tablet 1   Calcium Carbonate (CALCIUM 600 PO) Take 600 mg by mouth every morning.     Cholecalciferol (VITAMIN D3) 50 MCG (2000 UT) TABS Take 2,000 Units by mouth daily.     clopidogrel (PLAVIX) 75 MG tablet TAKE 1 TABLET BY MOUTH DAILY 90 tablet 3   ezetimibe (ZETIA) 10 MG tablet Take 10 mg by mouth daily.     fludrocortisone (FLORINEF) 0.1 MG tablet Take 0.05 mg by mouth daily.     HUMALOG 100 UNIT/ML injection Inject into the skin See admin instructions. VIA INSULIN PUMP     indapamide (LOZOL) 1.25 MG tablet Take by mouth.     Insulin Human (INSULIN PUMP) SOLN Inject into the skin. HUMALOG 100 UNIT/ML     iron polysaccharides (NIFEREX) 150 MG capsule Take 150 mg by  mouth at bedtime.      levothyroxine (SYNTHROID) 100 MCG tablet Take 100 mcg by mouth daily.     magnesium chloride (SLOW-MAG) 64 MG TBEC SR tablet Take 1-2 tablets by mouth See admin instructions. TAKE 1 TABLET IN THE MORNING & TAKE 2 TABLETS AT NIGHT.     Multiple Vitamin (MULTIVITAMIN WITH MINERALS) TABS tablet Take 1 tablet by mouth daily.     Multiple Vitamins-Minerals (PRESERVISION AREDS 2 PO) Take 1 tablet by mouth daily.      Omega-3 Fatty Acids (FISH OIL) 1200 MG CAPS Take 1,200 mg by mouth daily. (Patient not taking: Reported on 01/02/2023)     predniSONE (DELTASONE) 5 MG tablet Take 5 mg by mouth daily with breakfast.     VELTASSA 8.4 g packet Take 8.4 g by mouth once a week.     No current facility-administered medications for this visit.    Family History  Problem Relation Age of Onset   Heart disease Mother    Hypertension Mother    Hypertension Father    Sleep apnea Neg Hx     Social History   Socioeconomic History   Marital status: Married    Spouse name: Reita Cliche   Number of children: Not on file   Years of education: Not on file   Highest education level: Not on file  Occupational History   Not on file  Tobacco Use   Smoking status: Never   Smokeless tobacco: Never  Vaping Use   Vaping status: Never Used  Substance and Sexual Activity   Alcohol use: No    Alcohol/week: 0.0 standard drinks of alcohol   Drug use: No   Sexual activity: Not on file  Other Topics Concern   Not on file  Social History Narrative   Lives with husband   Social Drivers of Corporate investment banker Strain: Not on file  Food Insecurity: Low Risk  (12/25/2022)   Received from [facility]   Hunger Vital Sign    Worried About Running Out of Food in the Last Year: Never true    Ran Out of Food in the Last Year: Never true  Transportation Needs: No Transportation Needs (12/25/2022)   Received from [employer]    In the past 12 months, has lack of reliable  transportation kept you from medical appointments, meetings, work or from getting things needed for daily living? : No  Physical Activity: Not on file  Stress: No Stress Concern Present (10/24/2018)   Received from [facility], [facility] - Occupational Stress Questionnaire    Feeling of Stress : Not at all  Social Connections: Unknown (10/24/2018)   Received from [facility], [facility]   Social Connection and Isolation Panel [NHANES]    Frequency of Communication with Friends and Family: Not on file    Frequency of Social Gatherings with Friends and Family: Not on file    Attends Religious Services: Not on file    Active Member of Clubs or Organizations: Not on file    Attends Banker Meetings: Not asked    Marital Status: Not asked  Intimate Partner Violence: Not At Risk (10/24/2018)   Received from [facility], [facility]   Humiliation, Afraid, Rape, and Kick questionnaire    Fear of Current or Ex-Partner: No    Emotionally Abused: No    Physically Abused: No    Sexually Abused: No     REVIEW OF SYSTEMS:   [X]  denotes positive finding, [ ]  denotes negative finding Cardiac  Comments:  Chest pain or chest pressure:    Shortness of breath upon exertion:    Short of breath when lying flat:    Irregular heart rhythm:        Vascular    Pain in calf, thigh, or hip brought on by ambulation:    Pain in feet at night that wakes you up from your sleep:     Blood clot in your veins:    Leg swelling:         Pulmonary    Oxygen at home:    Productive cough:     Wheezing:         Neurologic    Sudden weakness in arms or legs:     Sudden numbness in arms or legs:     Sudden onset of difficulty speaking or slurred speech:    Temporary loss of vision in one eye:     Problems with dizziness:         Gastrointestinal    Blood in stool:     Vomited blood:         Genitourinary    Burning when  urinating:     Blood in urine:        Psychiatric    Major depression:         Hematologic    Bleeding problems:    Problems with blood clotting too easily:        Skin    Rashes or ulcers:        Constitutional    Fever or chills:      PHYSICAL EXAMINATION:  Today's Vitals   01/14/23 0939  BP: (!) 150/73  Pulse: 66  Resp: 20  Temp: 97.8 F (36.6 C)  TempSrc: Temporal  SpO2: 97%  Weight: 123 lb 11.2 oz (56.1 kg)  Height: 5\' 2"  (1.575 m)   Body mass index is 22.63 kg/m.   General:  WDWN in NAD; vital signs documented above Gait: Not observed HENT: WNL, normocephalic Pulmonary: normal non-labored breathing , without wheezing Cardiac: regular HR, without carotid bruits Abdomen: soft, NT; aortic pulse is not palpable Skin: without rashes Vascular Exam/Pulses:  Right Left  Radial 2+ (normal) 2+ (normal)  Femoral 2+ (normal) 2+ (normal)  DP 2+ (normal) 2+ (normal)   Extremities: without ischemic changes, without Gangrene , without cellulitis; with open small open wound right lower leg  Musculoskeletal: no muscle wasting or atrophy  Neurologic: A&O X 3 Psychiatric:  The pt has Normal affect.   Non-Invasive Vascular Imaging:   ABI's/TBI's on 01/14/2023: Right:  1.02/0.52 - Great toe pressure: 90 Left:  0.80/0.51 - Great toe pressure: 88  Arterial duplex on 01/14/2023: Abdominal Aorta Findings:  +--------+-------+----------+----------+--------+--------+--------+  LocationAP (cm)Trans (cm)PSV (cm/s)WaveformThrombusComments  +--------+-------+----------+----------+--------+--------+--------+  Proximal                117                                 +--------+-------+----------+----------+--------+--------+--------+  Mid                     183                                 +--------+-------+----------+----------+--------+--------+--------+  Distal                  181                                  +--------+-------+----------+----------+--------+--------+--------+   Visualization of the Proximal Abdominal Aorta, Mid Abdominal Aorta and Distal Abdominal Aorta was limited. Atherosclerotic plaque of the Aorta with posterior shadowing.   Right Stent(s):  +----------------------+--------+---------------+---------+----------------  Right common iliac    PSV cm/sStenosis       Waveform Comments         artery stent                                                           +----------------------+--------+---------------+---------+----------------  Prox to Stent         181     50-99% stenosis         Distal Ao, shadowing  +----------------------+--------+---------------+---------+----------------  Proximal Stent        315     50-99% stenosisturbulent                 +----------------------+--------+---------------+---------+----------------  Mid Stent             208     50-99% stenosisturbulent                 +----------------------+--------+---------------+---------+----------------  Distal Stent          234     50-99% stenosisturbulent                 +----------------------+--------+---------------+---------+----------------  Distal to Stent       134                    biphasic                  +----------------------+--------+---------------+---------+----------------   Left Stent(s):  +----------------------+--------+---------------+---------+----------------  Left common iliac     PSV cm/sStenosis       Waveform Comments         artery stent                                                               +----------------------+--------+---------------+---------+----------------  Prox to Stent         181     50-99% stenosis         Distal Ao, shadowing  +----------------------+--------+---------------+---------+----------------  Proximal Stent        279     50-99% stenosisturbulent                  +----------------------+--------+---------------+---------+----------------  Mid Stent             193                    turbulent                 +----------------------+--------+---------------+---------+----------------  Distal Stent          149                    turbulent                      +----------------------+--------+---------------+---------+----------------  Distal to Stent       197     1-49% stenosis biphasic                  +----------------------+--------+---------------+---------+-------------   +----------+--------+-----+---------------+----------+--------+  RIGHT    PSV cm/sRatioStenosis       Waveform  Comments  +----------+--------+-----+---------------+----------+--------+  POP Distal223          50-74% stenosismonophasic          +----------+--------+-----+---------------+----------+--------+  TP Trunk  93                          monophasic          +----------+--------+-----+---------------+----------+--------+    Right Graft #1: Right femoral-popliteal artery bypass graft  +------------------+--------+---------------+----------+-------------------                    PSV cm/sStenosis       Waveform  Comments            +------------------+--------+---------------+----------+-------------------  Inflow           174     30-49% stenosisbiphasic                      +------------------+--------+---------------+----------+-------------------  Prox Anastomosis  182     50-70% stenosis          turbulent           +------------------+--------+---------------+----------+-------------------  Proximal Graft    369     >70% stenosis            tortuous, turbulent  +------------------+--------+---------------+----------+-------------------  Mid Graft         62                     monophasic                    +------------------+--------+---------------+----------+-------------------  Distal Graft       54                     monophasic                    +------------------+--------+---------------+----------+-------------------  Distal Anastomosis177                    monophasic                    +------------------+--------+---------------+----------+-------------------  Outflow          153     30-49% stenosismonophasic                    +------------------+--------+---------------+----------+-------------------   Summary:  Right: 30-49% stenosis noted in the common femoral artery. 50-74% stenosis  noted in the popliteal artery. Right fem-pop artery bypass graft 50%-70%  stenosis in the anastomosis with >70% stenosis in the tortous proximal  graft.  Previous ABI's/TBI's on 01/08/2022: Right:  [place]/0.61 - Great toe pressure: 91 Left:  /0.58 - Great toe pressure:  87  Previous arterial duplex on 01/08/2022: +--------+--------+-----+---------------+----------+--------+  RIGHT  PSV cm/sRatioStenosis       Waveform  Comments  +--------+--------+-----+---------------+----------+--------+  CFA Prox130                         biphasic            +--------+--------+-----+---------------+----------+--------+  POP Prox263          50-74% stenosismonophasic          +--------+--------+-----+---------------+----------+--------+  POP Mid 73                          monophasic          +--------+--------+-----+---------------+----------+--------+   Right Graft #1: Femoral to popliteal bypass graft  +------------------+--------+---------------+---------+--------------------                   PSV cm/sStenosis       Waveform Comments             +------------------+--------+---------------+---------+--------------------  Inflow           113                    biphasic broad                +------------------+--------+---------------+---------+--------------------  Prox Anastomosis  128                    triphasic                      +------------------+--------+---------------+---------+--------------------  Proximal Graft    272 / 6550-70% stenosisbiphasic tortuous , no  Plaque visualized           +------------------+--------+---------------+---------+--------------------  Mid Graft         67                     biphasic broad                +------------------+--------+---------------+---------+--------------------  Distal Graft      69                     biphasic                      +------------------+--------+---------------+---------+--------------------  Distal Anastomosis55                     biphasic                      +------------------+--------+---------------+---------+--------------------   Outflow          123                    biphasic broad                +------------------+--------+---------------+---------+--------------------  Summary:  Right: 50-74% stenosis noted in the popliteal artery. 50 - 70% stenosis in the proximal Graft at area of tortuosity.    Summary:  Patent bilateral common iliac artery stents with no visualized stenosis.   Previous carotid artery duplex 04/13/2021: No evidence of bilateral ICA stenosis   ASSESSMENT/PLAN:: 72 y.o. female here for follow up for PAD with hx of right femoral to above-the-knee popliteal bypass with vein in 2008 in [place]. She has also had bilateral common iliac stenting by Dr. Myra Gianotti in May 2021 due to disabling claudication.   -pt has palpable DP pulses bilaterally, however, on duplex today, she has a significant stenosis in the proximal bypass graft of 369 cm/s, which has increased from last year and at risk for thrombosis.  She also has a significant stenosis in the proximal right iliac stent with velocity of 315 cm/s.  She has palpable femoral pulses bilaterally.  She also has a new wound on the right lower leg.  It has only been present for a couple of days.   -discussed with Dr. Myra Gianotti and given the elevated  velocities and new wound, would proceed with angiogram.  She does have a creatinine of 1.91.  discussed IV hydration with Dr. Myra Gianotti and he is going to use mainly CO2 for angiogram and therefore would not need IV hydration.  Will get this scheduled at a time that is convenient with pt and Dr. Myra Gianotti.  I discussed this with pt and her husband. -continue statin/plavix.  She is on plavix for hx of stroke.      Doreatha Massed, [facility] [phone]  Clinic MD:   Myra Gianotti

## 2023-01-18 ENCOUNTER — Other Ambulatory Visit: Payer: Self-pay

## 2023-01-18 DIAGNOSIS — Z95828 Presence of other vascular implants and grafts: Secondary | ICD-10-CM

## 2023-01-18 DIAGNOSIS — T82856A Stenosis of peripheral vascular stent, initial encounter: Secondary | ICD-10-CM

## 2023-01-25 DIAGNOSIS — N189 Chronic kidney disease, unspecified: Secondary | ICD-10-CM | POA: Diagnosis not present

## 2023-01-29 ENCOUNTER — Encounter (HOSPITAL_COMMUNITY)
Admission: RE | Disposition: A | Discharge status: Home / Self Care | Payer: Self-pay | Source: Home / Self Care | Attending: Surgery

## 2023-01-29 ENCOUNTER — Ambulatory Visit (HOSPITAL_COMMUNITY)
Admission: RE | Admit: 2023-01-29 | Discharge: 2023-01-29 | Disposition: A | Discharge status: Home / Self Care | Payer: Medicare Other | Attending: Surgery | Admitting: Surgery

## 2023-01-29 ENCOUNTER — Other Ambulatory Visit: Payer: Self-pay

## 2023-01-29 DIAGNOSIS — E11621 Type 2 diabetes mellitus with foot ulcer: Secondary | ICD-10-CM | POA: Insufficient documentation

## 2023-01-29 DIAGNOSIS — Z794 Long term (current) use of insulin: Secondary | ICD-10-CM | POA: Insufficient documentation

## 2023-01-29 DIAGNOSIS — Z7902 Long term (current) use of antithrombotics/antiplatelets: Secondary | ICD-10-CM | POA: Insufficient documentation

## 2023-01-29 DIAGNOSIS — T82858A Stenosis of vascular prosthetic devices, implants and grafts, initial encounter: Secondary | ICD-10-CM | POA: Diagnosis not present

## 2023-01-29 DIAGNOSIS — Y832 Surgical operation with anastomosis, bypass or graft as the cause of abnormal reaction of the patient, or of later complication, without mention of misadventure at the time of the procedure: Secondary | ICD-10-CM | POA: Insufficient documentation

## 2023-01-29 DIAGNOSIS — E1151 Type 2 diabetes mellitus with diabetic peripheral angiopathy without gangrene: Secondary | ICD-10-CM | POA: Diagnosis not present

## 2023-01-29 DIAGNOSIS — L97519 Non-pressure chronic ulcer of other part of right foot with unspecified severity: Secondary | ICD-10-CM | POA: Diagnosis not present

## 2023-01-29 DIAGNOSIS — I70235 Atherosclerosis of native arteries of right leg with ulceration of other part of foot: Secondary | ICD-10-CM | POA: Diagnosis not present

## 2023-01-29 DIAGNOSIS — N183 Chronic kidney disease, stage 3 unspecified: Secondary | ICD-10-CM | POA: Insufficient documentation

## 2023-01-29 DIAGNOSIS — I739 Peripheral vascular disease, unspecified: Secondary | ICD-10-CM

## 2023-01-29 DIAGNOSIS — I129 Hypertensive chronic kidney disease with stage 1 through stage 4 chronic kidney disease, or unspecified chronic kidney disease: Secondary | ICD-10-CM | POA: Diagnosis not present

## 2023-01-29 DIAGNOSIS — Z79899 Other long term (current) drug therapy: Secondary | ICD-10-CM | POA: Diagnosis not present

## 2023-01-29 DIAGNOSIS — E1122 Type 2 diabetes mellitus with diabetic chronic kidney disease: Secondary | ICD-10-CM | POA: Insufficient documentation

## 2023-01-29 DIAGNOSIS — E785 Hyperlipidemia, unspecified: Secondary | ICD-10-CM | POA: Diagnosis not present

## 2023-01-29 DIAGNOSIS — Z95828 Presence of other vascular implants and grafts: Secondary | ICD-10-CM

## 2023-01-29 DIAGNOSIS — I70233 Atherosclerosis of native arteries of right leg with ulceration of ankle: Secondary | ICD-10-CM | POA: Diagnosis not present

## 2023-01-29 DIAGNOSIS — Z8673 Personal history of transient ischemic attack (TIA), and cerebral infarction without residual deficits: Secondary | ICD-10-CM | POA: Diagnosis not present

## 2023-01-29 DIAGNOSIS — T82856A Stenosis of peripheral vascular stent, initial encounter: Secondary | ICD-10-CM

## 2023-01-29 HISTORY — PX: ABDOMINAL AORTOGRAM W/LOWER EXTREMITY: CATH118223

## 2023-01-29 HISTORY — PX: PERIPHERAL VASCULAR BALLOON ANGIOPLASTY: CATH118281

## 2023-01-29 LAB — POCT I-STAT, CHEM 8
BUN: 35 mg/dL — ABNORMAL HIGH (ref 8–23)
Calcium, Ion: 1.09 mmol/L — ABNORMAL LOW (ref 1.15–1.40)
Chloride: 100 mmol/L (ref 98–111)
Creatinine, Ser: 1.7 mg/dL — ABNORMAL HIGH (ref 0.44–1.00)
Glucose, Bld: 132 mg/dL — ABNORMAL HIGH (ref 70–99)
HCT: 34 % — ABNORMAL LOW (ref 36.0–46.0)
Hemoglobin: 11.6 g/dL — ABNORMAL LOW (ref 12.0–15.0)
Potassium: 3.2 mmol/L — ABNORMAL LOW (ref 3.5–5.1)
Sodium: 141 mmol/L (ref 135–145)
TCO2: 27 mmol/L (ref 22–32)

## 2023-01-29 SURGERY — ABDOMINAL AORTOGRAM W/LOWER EXTREMITY
Anesthesia: LOCAL

## 2023-01-29 MED ORDER — FENTANYL CITRATE (PF) 100 MCG/2ML IJ SOLN
INTRAMUSCULAR | Status: DC | PRN
  Administered 2023-01-29: 50 ug via INTRAVENOUS

## 2023-01-29 MED ORDER — MORPHINE SULFATE (PF) 2 MG/ML IV SOLN
2.0000 mg | INTRAVENOUS | Status: DC | PRN
Start: 2023-01-29 — End: 2023-01-29

## 2023-01-29 MED ORDER — HEPARIN (PORCINE) IN NACL 1000-0.9 UT/500ML-% IV SOLN
INTRAVENOUS | Status: DC | PRN
  Administered 2023-01-29 (×2): 500 mL

## 2023-01-29 MED ORDER — SODIUM CHLORIDE 0.9 % WEIGHT BASED INFUSION: 1.0000 mL/kg/h | INTRAVENOUS | Status: DC

## 2023-01-29 MED ORDER — MIDAZOLAM HCL 2 MG/2ML IJ SOLN
INTRAMUSCULAR | Status: AC
  Filled 2023-01-29: qty 2

## 2023-01-29 MED ORDER — LIDOCAINE HCL (PF) 1 % IJ SOLN
INTRAMUSCULAR | Status: DC | PRN
  Administered 2023-01-29: 12 mL

## 2023-01-29 MED ORDER — OXYCODONE HCL 5 MG PO TABS: 5.0000 mg | ORAL_TABLET | ORAL | Status: DC | PRN

## 2023-01-29 MED ORDER — SODIUM CHLORIDE 0.9% FLUSH: 3.0000 mL | Freq: Two times a day (BID) | INTRAVENOUS | Status: DC

## 2023-01-29 MED ORDER — ASPIRIN 81 MG PO TBEC: 81.0000 mg | DELAYED_RELEASE_TABLET | Freq: Every day | ORAL | Status: DC

## 2023-01-29 MED ORDER — ACETAMINOPHEN 325 MG PO TABS: 650.0000 mg | ORAL_TABLET | ORAL | Status: DC | PRN

## 2023-01-29 MED ORDER — IODIXANOL 320 MG/ML IV SOLN
INTRAVENOUS | Status: DC | PRN
  Administered 2023-01-29: 8 mL

## 2023-01-29 MED ORDER — SODIUM CHLORIDE 0.9 % IV SOLN: 250.0000 mL | INTRAVENOUS | Status: DC | PRN

## 2023-01-29 MED ORDER — MIDAZOLAM HCL 2 MG/2ML IJ SOLN
INTRAMUSCULAR | Status: DC | PRN
  Administered 2023-01-29: 2 mg via INTRAVENOUS

## 2023-01-29 MED ORDER — HYDRALAZINE HCL 20 MG/ML IJ SOLN: 5.0000 mg | INTRAMUSCULAR | Status: DC | PRN

## 2023-01-29 MED ORDER — SODIUM CHLORIDE 0.9 % IV SOLN
INTRAVENOUS | Status: DC
  Administered 2023-01-29: 250 mL via INTRAVENOUS

## 2023-01-29 MED ORDER — SODIUM CHLORIDE 0.9% FLUSH: 3.0000 mL | INTRAVENOUS | Status: DC | PRN

## 2023-01-29 MED ORDER — LIDOCAINE HCL (PF) 1 % IJ SOLN
INTRAMUSCULAR | Status: AC
  Filled 2023-01-29: qty 30

## 2023-01-29 MED ORDER — ONDANSETRON HCL 4 MG/2ML IJ SOLN: 4.0000 mg | Freq: Four times a day (QID) | INTRAMUSCULAR | Status: DC | PRN

## 2023-01-29 MED ORDER — LABETALOL HCL 5 MG/ML IV SOLN: 10.0000 mg | INTRAVENOUS | Status: DC | PRN

## 2023-01-29 MED ORDER — FENTANYL CITRATE (PF) 100 MCG/2ML IJ SOLN
INTRAMUSCULAR | Status: AC
  Filled 2023-01-29: qty 2

## 2023-01-29 MED ORDER — HEPARIN SODIUM (PORCINE) 1000 UNIT/ML IJ SOLN
INTRAMUSCULAR | Status: DC | PRN
  Administered 2023-01-29: 5000 [IU] via INTRAVENOUS

## 2023-01-29 SURGICAL SUPPLY — 22 items
CATH ANGIO 5F BER2 65CM (CATHETERS) IMPLANT
CATH OMNI FLUSH 5F 65CM (CATHETERS) IMPLANT
COVER DOME SNAP 22 D (MISCELLANEOUS) IMPLANT
DCB RANGER 4.0X60 135 (BALLOONS) IMPLANT
DEVICE VASC CLSR CELT ART 5 (Vascular Products) IMPLANT
KIT ANGIASSIST CO2 SYSTEM (KITS) IMPLANT
KIT ENCORE 26 ADVANTAGE (KITS) IMPLANT
KIT MICROPUNCTURE NIT STIFF (SHEATH) IMPLANT
KIT PV (KITS) ×2 IMPLANT
RANGER DCB 4.0X60 135 (BALLOONS) ×2
SET ATX-X65L (MISCELLANEOUS) IMPLANT
SHEATH CATAPULT 5F 45 MP (SHEATH) IMPLANT
SHEATH PINNACLE 5F 10CM (SHEATH) IMPLANT
SHEATH PROBE COVER 6X72 (BAG) IMPLANT
STOPCOCK MORSE 400PSI 3WAY (MISCELLANEOUS) IMPLANT
TRANSDUCER W/STOPCOCK (MISCELLANEOUS) ×2 IMPLANT
TRAY PV CATH (CUSTOM PROCEDURE TRAY) ×2 IMPLANT
TUBING ART PRESS 72 MALE/FEM (TUBING) IMPLANT
WIRE BENTSON .035X145CM (WIRE) IMPLANT
WIRE HI TORQ VERSACORE 300 (WIRE) IMPLANT
WIRE SPARTACORE .014X190CM (WIRE) IMPLANT
WIRE SPARTACORE .014X300CM (WIRE) IMPLANT

## 2023-01-29 NOTE — Progress Notes (Signed)
 Upon returning to short stay post procedure, pt's glucose monitor states CBG is 133.

## 2023-01-29 NOTE — Interval H&P Note (Signed)
 History and Physical Interval Note:  01/29/2023 10:11 AM  Kendra Walker  has presented today for surgery, with the diagnosis of ileac stent stenosis and s/p fem /pop bypasss.  The various methods of treatment have been discussed with the patient and family. After consideration of risks, benefits and other options for treatment, the patient has consented to  Procedure(s): ABDOMINAL AORTOGRAM W/LOWER EXTREMITY (N/A) as a surgical intervention.  The patient's history has been reviewed, patient examined, no change in status, stable for surgery.  I have reviewed the patient's chart and labs.  Questions were answered to the patient's satisfaction.     Malvina New

## 2023-01-29 NOTE — Op Note (Signed)
 Patient name: Kendra Walker MRN: [id number] DOB: 1950-09-04 Sex: female  01/29/2023 Pre-operative Diagnosis: Right leg ulcer Post-operative diagnosis:  Same Surgeon:  Malvina New Procedure Performed:  1.  Ultrasound-guided access, left femoral artery  2.  Abdominal aortogram with CO2  3.  Catheter placement into right common femoral artery with selective imaging of the right leg  4.  Right leg angiogram  5.  Drug-coated balloon angioplasty, right femoral-popliteal bypass graft  6.  Conscious sedation, 50 minutes  7.  Closure device, Celt   Indications: This is a 73 year old female with history of right femoral-popliteal bypass graft in [place]  as well as bilateral iliac stents done in [place].  She has not really had any change in her symptoms however she has a nonhealing wound on her right foot.  Ultrasound studies indicated proximal bypass graft stenosis and possible in-stent stenosis.  She comes in today for further evaluation and possible treatment.  Procedure:  The patient was identified in the holding area and taken to room 8.  The patient was then placed supine on the table and prepped and draped in the usual sterile fashion.  A time out was called.  Conscious sedation was administered with the use of IV fentanyl  and Versed  under continuous physician and nurse monitoring.  Heart rate, blood pressure, and oxygen saturation were continuously monitored.  Total sedation time was 50 minutes.  Ultrasound was used to evaluate the left common femoral artery.  It was patent .  A digital ultrasound image was acquired.  A micropuncture needle was used to access the left common femoral artery under ultrasound guidance.  An 018 wire was advanced without resistance and a micropuncture sheath was placed.  The 018 wire was removed and a benson wire was placed.  The micropuncture sheath was exchanged for a 5 french sheath.  An omniflush catheter was advanced over the wire to the level of L-1.  An  abdominal angiogram with CO2 was obtained.  Next, using the omniflush catheter was pulled down to the aortic bifurcation and pelvic oblique images were obtained with CO2.  I then crossed the aortic bifurcation and placed a catheter in the right common femoral artery and did right leg runoff with CO2.  I then got selective images with small amounts of contrast to better define her anatomy.  Findings:   Aortogram: No significant renal artery stenosis was visualized.  The infrarenal aorta is widely patent.  Bilateral common and external iliac arteries are widely patent as are their associated stents.  There is no significant pressure gradient across the stents.  The external iliac arteries are widely patent.  Right Lower Extremity: The right common femoral and profundofemoral artery are widely patent.  There is a bypass graft originating from the common femoral artery.  The distal anastomosis is to the popliteal artery.  There is two-vessel runoff via the anterior tibial and peroneal artery.  There is diffuse disease out onto the foot.  There looks like a approximately 50% stenosis at the origin of the bypass graft  Left Lower Extremity: Not evaluated due to renal issues  Intervention: After the above images were acquired the decision was made to proceed with intervention.  A 5 French 45 cm catapult sheath was advanced into the right common femoral artery.  A 014 wire was then advanced into the bypass graft.  I selected a 4 x 60 Ranger drug-coated balloon and performed balloon angioplasty of the proximal anastomosis for 3 minutes.  Completion imaging revealed  improved results across the treated area.  It no longer appeared to be hemodynamically significant.  I was satisfied with these results.  The decision made to stop the procedure.  The sheath was removed with a Celt for closure.  There were no complications  Impression:  #1  No significant stenosis visualized in bilateral common iliac stents based off  angiographic imaging as well as pressure measurements  #2  Approximately 50% stenosis in the proximal right femoral-popliteal bypass graft.  This was successfully treated using a 4 mm drug-coated balloon.  #3  Two-vessel runoff on the right leg via the anterior tibial and peroneal artery.  The patient has diffuse disease out onto the foot   V. Malvina New, M.D., [facility] [place] Office: [phone] Pager:  [phone]

## 2023-01-30 ENCOUNTER — Encounter (HOSPITAL_COMMUNITY): Payer: Self-pay | Admitting: Surgery

## 2023-02-11 ENCOUNTER — Other Ambulatory Visit: Payer: Self-pay | Admitting: Adult Health

## 2023-02-11 DIAGNOSIS — I639 Cerebral infarction, unspecified: Secondary | ICD-10-CM

## 2023-02-11 NOTE — Telephone Encounter (Signed)
Last seen on 01/03/22 No follow up scheduled

## 2023-02-19 DIAGNOSIS — E1142 Type 2 diabetes mellitus with diabetic polyneuropathy: Secondary | ICD-10-CM | POA: Diagnosis not present

## 2023-02-19 DIAGNOSIS — L84 Corns and callosities: Secondary | ICD-10-CM | POA: Diagnosis not present

## 2023-02-19 DIAGNOSIS — L603 Nail dystrophy: Secondary | ICD-10-CM | POA: Diagnosis not present

## 2023-02-21 ENCOUNTER — Ambulatory Visit: Payer: Medicare Other | Admitting: Adult Health

## 2023-03-01 ENCOUNTER — Other Ambulatory Visit: Payer: Self-pay

## 2023-03-01 DIAGNOSIS — Z95828 Presence of other vascular implants and grafts: Secondary | ICD-10-CM

## 2023-03-05 DIAGNOSIS — Z13220 Encounter for screening for lipoid disorders: Secondary | ICD-10-CM | POA: Diagnosis not present

## 2023-03-05 DIAGNOSIS — N189 Chronic kidney disease, unspecified: Secondary | ICD-10-CM | POA: Diagnosis not present

## 2023-03-05 DIAGNOSIS — E875 Hyperkalemia: Secondary | ICD-10-CM | POA: Diagnosis not present

## 2023-03-05 DIAGNOSIS — Z1329 Encounter for screening for other suspected endocrine disorder: Secondary | ICD-10-CM | POA: Diagnosis not present

## 2023-03-05 DIAGNOSIS — E1169 Type 2 diabetes mellitus with other specified complication: Secondary | ICD-10-CM | POA: Diagnosis not present

## 2023-03-05 DIAGNOSIS — D631 Anemia in chronic kidney disease: Secondary | ICD-10-CM | POA: Diagnosis not present

## 2023-03-11 DIAGNOSIS — Z6821 Body mass index (BMI) 21.0-21.9, adult: Secondary | ICD-10-CM | POA: Diagnosis not present

## 2023-03-11 DIAGNOSIS — I10 Essential (primary) hypertension: Secondary | ICD-10-CM | POA: Diagnosis not present

## 2023-03-11 DIAGNOSIS — N184 Chronic kidney disease, stage 4 (severe): Secondary | ICD-10-CM | POA: Diagnosis not present

## 2023-03-11 DIAGNOSIS — E782 Mixed hyperlipidemia: Secondary | ICD-10-CM | POA: Diagnosis not present

## 2023-03-11 DIAGNOSIS — E1021 Type 1 diabetes mellitus with diabetic nephropathy: Secondary | ICD-10-CM | POA: Diagnosis not present

## 2023-03-18 ENCOUNTER — Ambulatory Visit (INDEPENDENT_AMBULATORY_CARE_PROVIDER_SITE_OTHER): Payer: Medicare Other | Admitting: Physician Assistant

## 2023-03-18 ENCOUNTER — Ambulatory Visit (HOSPITAL_COMMUNITY)
Admission: RE | Admit: 2023-03-18 | Discharge: 2023-03-18 | Disposition: A | Discharge status: Home / Self Care | Payer: Medicare Other | Source: Ambulatory Visit | Attending: Surgery | Admitting: Surgery

## 2023-03-18 ENCOUNTER — Ambulatory Visit (INDEPENDENT_AMBULATORY_CARE_PROVIDER_SITE_OTHER)
Admission: RE | Admit: 2023-03-18 | Discharge: 2023-03-18 | Disposition: A | Discharge status: Home / Self Care | Payer: Medicare Other | Source: Ambulatory Visit | Attending: Surgery | Admitting: Surgery

## 2023-03-18 VITALS — BP 151/75 | HR 74 | Temp 98.0°F | Resp 18 | Ht 61.0 in | Wt 117.3 lb

## 2023-03-18 DIAGNOSIS — Z95828 Presence of other vascular implants and grafts: Secondary | ICD-10-CM

## 2023-03-18 DIAGNOSIS — I70213 Atherosclerosis of native arteries of extremities with intermittent claudication, bilateral legs: Secondary | ICD-10-CM | POA: Diagnosis not present

## 2023-03-18 LAB — VAS US ABI WITH/WO TBI
Left ABI: 0.97
Right ABI: 1.25

## 2023-03-18 NOTE — Progress Notes (Signed)
 Office Note     CC:  follow up Requesting Provider:  Juliette Alcide, MD  HPI: Kendra Walker is a 73 y.o. (02/20/1950) female who presents status post drug-coated balloon angioplasty of the origin of the right femoral to popliteal bypass graft by Dr. Myra Gianotti on 01/29/2023.  This was performed due to recurrent ulcer of the right ankle.  Patient states the ulcer has since healed since surgery.  She denies any claudication, rest pain, or further tissue loss.  She believes the left groin access site has completely healed.  She is taking her Plavix and statin daily.   Past Medical History:  Diagnosis Date   Cancer (HCC)    Squamous Cell   Diabetes mellitus without complication (HCC)    Hyperlipemia    Hypertension    Hypothyroidism    PAD (peripheral artery disease) (HCC)    Peripheral arterial disease (HCC)    Stroke [facility])     Past Surgical History:  Procedure Laterality Date   ABDOMINAL AORTOGRAM W/LOWER EXTREMITY N/A 06/09/2019   Procedure: ABDOMINAL AORTOGRAM W/LOWER EXTREMITY;  Surgeon: Nada Libman, MD;  [facility];  Service: Cardiovascular;  Laterality: N/A;   ABDOMINAL AORTOGRAM W/LOWER EXTREMITY N/A 01/29/2023   Procedure: ABDOMINAL AORTOGRAM W/LOWER EXTREMITY;  Surgeon: Nada Libman, MD;  Location: [facility];  Service: Cardiovascular;  Laterality: N/A;   APPENDECTOMY     CATARACT EXTRACTION Bilateral    CORONARY ARTERY BYPASS GRAFT     LOOP RECORDER INSERTION N/A 10/17/2020   Procedure: LOOP RECORDER INSERTION;  Surgeon: Duke Salvia, MD;  Location: [facility] INVASIVE CV LAB;  Service: Cardiovascular;  Laterality: N/A;   PERIPHERAL VASCULAR BALLOON ANGIOPLASTY  01/29/2023   Procedure: PERIPHERAL VASCULAR BALLOON ANGIOPLASTY;  Surgeon: Nada Libman, MD;  Location: [facility];  Service: Cardiovascular;;   PERIPHERAL VASCULAR INTERVENTION Bilateral 06/09/2019   Procedure: PERIPHERAL VASCULAR INTERVENTION;  Surgeon: Nada Libman, MD;   [facility]: [facility];  Service: Cardiovascular;  Laterality: Bilateral;   TONSILLECTOMY     YAG LASER APPLICATION Right 05/11/2013   Procedure: YAG LASER APPLICATION;  Surgeon: Susa Simmonds, MD;  Location: [facility];  Service: Ophthalmology;  Laterality: Right;    Social History   Socioeconomic History   Marital status: Married    Spouse name: Reita Cliche   Number of children: Not on file   Years of education: Not on file   Highest education level: Not on file  Occupational History   Not on file  Tobacco Use   Smoking status: Never   Smokeless tobacco: Never  Vaping Use   Vaping status: Never Used  Substance and Sexual Activity   Alcohol use: No    Alcohol/week: 0.0 standard drinks of alcohol   Drug use: No   Sexual activity: Not on file  Other Topics Concern   Not on file  Social History Narrative   Lives with husband   Social Drivers of Corporate investment banker Strain: Not on file  Food Insecurity: Low Risk  (12/25/2022)   Received from [facility]   Hunger Vital Sign    Worried About Running Out of Food in the Last Year: Never true    Ran Out of Food in the Last Year: Never true  Transportation Needs: No Transportation Needs (12/25/2022)   Received from [employer]    In the past 12 months, has lack of reliable transportation kept you from medical appointments,  meetings, work or from getting things needed for daily living? : No  Physical Activity: Not on file  Stress: No Stress Concern Present (10/24/2018)   Received from [employer], [facility]   [facility] - Occupational Stress Questionnaire    Feeling of Stress : Not at all  Social Connections: Unknown (10/24/2018)   Received from [facility], [facility]   Social Connection and Isolation Panel [NHANES]    Frequency of Communication with Friends and Family: Not on file    Frequency of Social Gatherings with Friends and Family: Not on file     Attends Religious Services: Not on file    Active Member of Clubs or Organizations: Not on file    Attends Banker Meetings: Not asked    Marital Status: Not asked  Intimate Partner Violence: Not At Risk (10/24/2018)   Received from [facility], [facility]   Humiliation, Afraid, Rape, and Kick questionnaire    Fear of Current or Ex-Partner: No    Emotionally Abused: No    Physically Abused: No    Sexually Abused: No    Family History  Problem Relation Age of Onset   Heart disease Mother    Hypertension Mother    Hypertension Father    Sleep apnea Neg Hx     Current Outpatient Medications  Medication Sig Dispense Refill   acetaminophen (TYLENOL) 500 MG tablet Take 500 mg by mouth every 6 (six) hours as needed for mild pain or headache.      ascorbic Acid (VITAMIN C) 500 MG CPCR Take 500 mg by mouth daily. Timed release     atorvastatin (LIPITOR) 80 MG tablet TAKE 1 TABLET BY MOUTH EVERY DAY 90 tablet 1   Calcium Carbonate (CALCIUM 600 PO) Take 1,200 mg by mouth every morning.     Cholecalciferol (VITAMIN D3) 50 MCG (2000 UT) TABS Take 2,000 Units by mouth daily.     clopidogrel (PLAVIX) 75 MG tablet TAKE 1 TABLET BY MOUTH DAILY 90 tablet 3   ezetimibe (ZETIA) 10 MG tablet Take 10 mg by mouth at bedtime.     fludrocortisone (FLORINEF) 0.1 MG tablet Take 0.05 mg by mouth daily.     HUMALOG 100 UNIT/ML injection Inject into the skin See admin instructions. VIA INSULIN PUMP     indapamide (LOZOL) 1.25 MG tablet Take 1.25 mg by mouth daily.     Insulin Human (INSULIN PUMP) SOLN Inject into the skin. HUMALOG 100 UNIT/ML  lispro     iron polysaccharides (NIFEREX) 150 MG capsule Take 150 mg by mouth at bedtime.      levothyroxine (SYNTHROID) 112 MCG tablet Take 112 mcg by mouth daily.     magnesium chloride (SLOW-MAG) 64 MG TBEC SR tablet Take 1-2 tablets by mouth See admin instructions. TAKE 1 TABLET IN THE MORNING & TAKE 2 TABLETS AT NIGHT.     Multiple Vitamin  (MULTIVITAMIN WITH MINERALS) TABS tablet Take 1 tablet by mouth daily.     Multiple Vitamins-Minerals (PRESERVISION AREDS 2 PO) Take 1 tablet by mouth daily.      predniSONE (DELTASONE) 5 MG tablet Take 5 mg by mouth daily with breakfast.     sodium polystyrene (KAYEXALATE) powder Take by mouth 2 (two) times a week. 20 ml in Zollie Beckers     No current facility-administered medications for this visit.    Allergies  Allergen Reactions   Bactrim [Sulfamethoxazole-Trimethoprim] Other (See Comments)  Elevates potassium.    Tape Other (See Comments)    not allergic but causes sensitivity.   Epoetin Alfa Rash   Fluorescein Rash   Procrit [Epoetin (Alfa)] Rash     REVIEW OF SYSTEMS:   [X]  denotes positive finding, [ ]  denotes negative finding Cardiac  Comments:  Chest pain or chest pressure:    Shortness of breath upon exertion:    Short of breath when lying flat:    Irregular heart rhythm:        Vascular    Pain in calf, thigh, or hip brought on by ambulation:    Pain in feet at night that wakes you up from your sleep:     Blood clot in your veins:    Leg swelling:         Pulmonary    Oxygen at home:    Productive cough:     Wheezing:         Neurologic    Sudden weakness in arms or legs:     Sudden numbness in arms or legs:     Sudden onset of difficulty speaking or slurred speech:    Temporary loss of vision in one eye:     Problems with dizziness:         Gastrointestinal    Blood in stool:     Vomited blood:         Genitourinary    Burning when urinating:     Blood in urine:        Psychiatric    Major depression:         Hematologic    Bleeding problems:    Problems with blood clotting too easily:        Skin    Rashes or ulcers:        Constitutional    Fever or chills:      PHYSICAL EXAMINATION:  Vitals:   03/18/23 1321  BP: (!) 151/75  Pulse: 74  Resp: 18  Temp: 98 F (36.7 C)  TempSrc: Temporal  SpO2: 96%  Weight: 117 lb 4.8 oz (53.2  kg)  Height: 5\' 1"  (1.549 m)    General:  WDWN in NAD; vital signs documented above Gait: Not observed HENT: WNL, normocephalic Pulmonary: normal non-labored breathing , without Rales, rhonchi,  wheezing Cardiac: regular HR Abdomen: soft, NT, no masses Skin: without rashes Vascular Exam/Pulses: Palpable right ATA pulse; L groin without palpable hematoma Extremities: without ischemic changes, without Gangrene , without cellulitis; without open wounds;  Musculoskeletal: no muscle wasting or atrophy  Neurologic: A&O X 3 Psychiatric:  The pt has Normal affect.   Non-Invasive Vascular Imaging:   Right lower extremity bypass duplex demonstrates widely patent bypass without any areas of hemodynamically significant stenosis    ASSESSMENT/PLAN:: 73 y.o. female status post aortogram with drug-coated balloon angioplasty of the right femoral to popliteal bypass graft  Right leg is well-perfused with a palpable ATA pulse.  Recurrent ulcer of the right ankle has since healed since her procedure.  Duplex demonstrates a widely patent right leg bypass graft without any areas of hemodynamically significant stenosis.  She will continue her Plavix and statin daily.  We will repeat right leg bypass duplex and ABI in 6 months.  At that time we will also check an aortoiliac duplex due to history of bilateral iliac stenting.  She knows to call/return office sooner with any questions or concerns.   Emilie Rutter, PA-C [facility] [phone]  Clinic MD:   Myra Gianotti

## 2023-03-21 ENCOUNTER — Other Ambulatory Visit: Payer: Self-pay | Admitting: *Deleted

## 2023-03-21 DIAGNOSIS — Z95828 Presence of other vascular implants and grafts: Secondary | ICD-10-CM

## 2023-03-22 DIAGNOSIS — E1021 Type 1 diabetes mellitus with diabetic nephropathy: Secondary | ICD-10-CM | POA: Diagnosis not present

## 2023-03-22 DIAGNOSIS — E782 Mixed hyperlipidemia: Secondary | ICD-10-CM | POA: Diagnosis not present

## 2023-03-22 DIAGNOSIS — E875 Hyperkalemia: Secondary | ICD-10-CM | POA: Diagnosis not present

## 2023-03-22 DIAGNOSIS — I6930 Unspecified sequelae of cerebral infarction: Secondary | ICD-10-CM | POA: Diagnosis not present

## 2023-03-25 DIAGNOSIS — R1110 Vomiting, unspecified: Secondary | ICD-10-CM | POA: Diagnosis not present

## 2023-03-25 DIAGNOSIS — N184 Chronic kidney disease, stage 4 (severe): Secondary | ICD-10-CM | POA: Diagnosis not present

## 2023-03-25 DIAGNOSIS — R5383 Other fatigue: Secondary | ICD-10-CM | POA: Diagnosis not present

## 2023-03-25 DIAGNOSIS — R300 Dysuria: Secondary | ICD-10-CM | POA: Diagnosis not present

## 2023-03-25 DIAGNOSIS — Z20828 Contact with and (suspected) exposure to other viral communicable diseases: Secondary | ICD-10-CM | POA: Diagnosis not present

## 2023-03-25 DIAGNOSIS — Z6820 Body mass index (BMI) 20.0-20.9, adult: Secondary | ICD-10-CM | POA: Diagnosis not present

## 2023-03-26 DIAGNOSIS — Z961 Presence of intraocular lens: Secondary | ICD-10-CM | POA: Diagnosis not present

## 2023-03-26 DIAGNOSIS — E103593 Type 1 diabetes mellitus with proliferative diabetic retinopathy without macular edema, bilateral: Secondary | ICD-10-CM | POA: Diagnosis not present

## 2023-04-02 DIAGNOSIS — R5383 Other fatigue: Secondary | ICD-10-CM | POA: Diagnosis not present

## 2023-04-02 DIAGNOSIS — Z6820 Body mass index (BMI) 20.0-20.9, adult: Secondary | ICD-10-CM | POA: Diagnosis not present

## 2023-04-02 DIAGNOSIS — N390 Urinary tract infection, site not specified: Secondary | ICD-10-CM | POA: Diagnosis not present

## 2023-04-02 DIAGNOSIS — Z1329 Encounter for screening for other suspected endocrine disorder: Secondary | ICD-10-CM | POA: Diagnosis not present

## 2023-04-02 DIAGNOSIS — R634 Abnormal weight loss: Secondary | ICD-10-CM | POA: Diagnosis not present

## 2023-04-02 DIAGNOSIS — D559 Anemia due to enzyme disorder, unspecified: Secondary | ICD-10-CM | POA: Diagnosis not present

## 2023-04-02 DIAGNOSIS — R300 Dysuria: Secondary | ICD-10-CM | POA: Diagnosis not present

## 2023-04-16 DIAGNOSIS — Z9641 Presence of insulin pump (external) (internal): Secondary | ICD-10-CM | POA: Diagnosis not present

## 2023-04-16 DIAGNOSIS — N189 Chronic kidney disease, unspecified: Secondary | ICD-10-CM | POA: Diagnosis not present

## 2023-04-16 DIAGNOSIS — E063 Autoimmune thyroiditis: Secondary | ICD-10-CM | POA: Diagnosis not present

## 2023-04-16 DIAGNOSIS — E1022 Type 1 diabetes mellitus with diabetic chronic kidney disease: Secondary | ICD-10-CM | POA: Diagnosis not present

## 2023-04-16 DIAGNOSIS — Z794 Long term (current) use of insulin: Secondary | ICD-10-CM | POA: Diagnosis not present

## 2023-04-16 DIAGNOSIS — E271 Primary adrenocortical insufficiency: Secondary | ICD-10-CM | POA: Diagnosis not present

## 2023-04-22 DIAGNOSIS — I6930 Unspecified sequelae of cerebral infarction: Secondary | ICD-10-CM | POA: Diagnosis not present

## 2023-04-22 DIAGNOSIS — E782 Mixed hyperlipidemia: Secondary | ICD-10-CM | POA: Diagnosis not present

## 2023-04-22 DIAGNOSIS — E875 Hyperkalemia: Secondary | ICD-10-CM | POA: Diagnosis not present

## 2023-04-22 DIAGNOSIS — E1021 Type 1 diabetes mellitus with diabetic nephropathy: Secondary | ICD-10-CM | POA: Diagnosis not present

## 2023-05-06 DIAGNOSIS — Z6820 Body mass index (BMI) 20.0-20.9, adult: Secondary | ICD-10-CM | POA: Diagnosis not present

## 2023-05-06 DIAGNOSIS — H05229 Edema of unspecified orbit: Secondary | ICD-10-CM | POA: Diagnosis not present

## 2023-05-06 DIAGNOSIS — S0512XA Contusion of eyeball and orbital tissues, left eye, initial encounter: Secondary | ICD-10-CM | POA: Diagnosis not present

## 2023-05-06 DIAGNOSIS — R220 Localized swelling, mass and lump, head: Secondary | ICD-10-CM | POA: Diagnosis not present

## 2023-05-08 DIAGNOSIS — Z6820 Body mass index (BMI) 20.0-20.9, adult: Secondary | ICD-10-CM | POA: Diagnosis not present

## 2023-05-08 DIAGNOSIS — H05229 Edema of unspecified orbit: Secondary | ICD-10-CM | POA: Diagnosis not present

## 2023-05-08 DIAGNOSIS — S0512XA Contusion of eyeball and orbital tissues, left eye, initial encounter: Secondary | ICD-10-CM | POA: Diagnosis not present

## 2023-05-09 DIAGNOSIS — E1142 Type 2 diabetes mellitus with diabetic polyneuropathy: Secondary | ICD-10-CM | POA: Diagnosis not present

## 2023-05-09 DIAGNOSIS — L603 Nail dystrophy: Secondary | ICD-10-CM | POA: Diagnosis not present

## 2023-05-09 DIAGNOSIS — L84 Corns and callosities: Secondary | ICD-10-CM | POA: Diagnosis not present

## 2023-05-11 ENCOUNTER — Other Ambulatory Visit: Payer: Self-pay | Admitting: Cardiology

## 2023-05-22 DIAGNOSIS — E782 Mixed hyperlipidemia: Secondary | ICD-10-CM | POA: Diagnosis not present

## 2023-05-22 DIAGNOSIS — E875 Hyperkalemia: Secondary | ICD-10-CM | POA: Diagnosis not present

## 2023-05-22 DIAGNOSIS — I6930 Unspecified sequelae of cerebral infarction: Secondary | ICD-10-CM | POA: Diagnosis not present

## 2023-05-22 DIAGNOSIS — E1021 Type 1 diabetes mellitus with diabetic nephropathy: Secondary | ICD-10-CM | POA: Diagnosis not present

## 2023-06-11 DIAGNOSIS — D559 Anemia due to enzyme disorder, unspecified: Secondary | ICD-10-CM | POA: Diagnosis not present

## 2023-06-11 DIAGNOSIS — E039 Hypothyroidism, unspecified: Secondary | ICD-10-CM | POA: Diagnosis not present

## 2023-06-11 DIAGNOSIS — R634 Abnormal weight loss: Secondary | ICD-10-CM | POA: Diagnosis not present

## 2023-06-11 DIAGNOSIS — R5383 Other fatigue: Secondary | ICD-10-CM | POA: Diagnosis not present

## 2023-06-11 DIAGNOSIS — E1021 Type 1 diabetes mellitus with diabetic nephropathy: Secondary | ICD-10-CM | POA: Diagnosis not present

## 2023-06-18 DIAGNOSIS — Z0001 Encounter for general adult medical examination with abnormal findings: Secondary | ICD-10-CM | POA: Diagnosis not present

## 2023-06-18 DIAGNOSIS — H6121 Impacted cerumen, right ear: Secondary | ICD-10-CM | POA: Diagnosis not present

## 2023-06-18 DIAGNOSIS — Z9189 Other specified personal risk factors, not elsewhere classified: Secondary | ICD-10-CM | POA: Diagnosis not present

## 2023-06-18 DIAGNOSIS — Z1389 Encounter for screening for other disorder: Secondary | ICD-10-CM | POA: Diagnosis not present

## 2023-06-18 DIAGNOSIS — D631 Anemia in chronic kidney disease: Secondary | ICD-10-CM | POA: Diagnosis not present

## 2023-06-18 DIAGNOSIS — Z6820 Body mass index (BMI) 20.0-20.9, adult: Secondary | ICD-10-CM | POA: Diagnosis not present

## 2023-06-18 DIAGNOSIS — Z1331 Encounter for screening for depression: Secondary | ICD-10-CM | POA: Diagnosis not present

## 2023-06-18 DIAGNOSIS — E1021 Type 1 diabetes mellitus with diabetic nephropathy: Secondary | ICD-10-CM | POA: Diagnosis not present

## 2023-06-21 DIAGNOSIS — I6930 Unspecified sequelae of cerebral infarction: Secondary | ICD-10-CM | POA: Diagnosis not present

## 2023-06-21 DIAGNOSIS — E782 Mixed hyperlipidemia: Secondary | ICD-10-CM | POA: Diagnosis not present

## 2023-06-21 DIAGNOSIS — E1021 Type 1 diabetes mellitus with diabetic nephropathy: Secondary | ICD-10-CM | POA: Diagnosis not present

## 2023-06-21 DIAGNOSIS — E875 Hyperkalemia: Secondary | ICD-10-CM | POA: Diagnosis not present

## 2023-07-01 DIAGNOSIS — H353222 Exudative age-related macular degeneration, left eye, with inactive choroidal neovascularization: Secondary | ICD-10-CM | POA: Diagnosis not present

## 2023-07-01 DIAGNOSIS — H353133 Nonexudative age-related macular degeneration, bilateral, advanced atrophic without subfoveal involvement: Secondary | ICD-10-CM | POA: Diagnosis not present

## 2023-07-01 DIAGNOSIS — E113553 Type 2 diabetes mellitus with stable proliferative diabetic retinopathy, bilateral: Secondary | ICD-10-CM | POA: Diagnosis not present

## 2023-07-01 DIAGNOSIS — H353212 Exudative age-related macular degeneration, right eye, with inactive choroidal neovascularization: Secondary | ICD-10-CM | POA: Diagnosis not present

## 2023-07-01 DIAGNOSIS — H35372 Puckering of macula, left eye: Secondary | ICD-10-CM | POA: Diagnosis not present

## 2023-07-02 DIAGNOSIS — I129 Hypertensive chronic kidney disease with stage 1 through stage 4 chronic kidney disease, or unspecified chronic kidney disease: Secondary | ICD-10-CM | POA: Diagnosis not present

## 2023-07-02 DIAGNOSIS — E875 Hyperkalemia: Secondary | ICD-10-CM | POA: Diagnosis not present

## 2023-07-02 DIAGNOSIS — I10 Essential (primary) hypertension: Secondary | ICD-10-CM | POA: Diagnosis not present

## 2023-07-02 DIAGNOSIS — N1832 Chronic kidney disease, stage 3b: Secondary | ICD-10-CM | POA: Diagnosis not present

## 2023-07-02 DIAGNOSIS — E1022 Type 1 diabetes mellitus with diabetic chronic kidney disease: Secondary | ICD-10-CM | POA: Diagnosis not present

## 2023-07-08 DIAGNOSIS — E271 Primary adrenocortical insufficiency: Secondary | ICD-10-CM | POA: Diagnosis not present

## 2023-07-08 DIAGNOSIS — E1065 Type 1 diabetes mellitus with hyperglycemia: Secondary | ICD-10-CM | POA: Diagnosis not present

## 2023-07-08 DIAGNOSIS — E039 Hypothyroidism, unspecified: Secondary | ICD-10-CM | POA: Diagnosis not present

## 2023-07-10 ENCOUNTER — Encounter: Payer: Self-pay | Admitting: Cardiology

## 2023-07-10 ENCOUNTER — Ambulatory Visit: Payer: Medicare Other | Attending: Cardiology | Admitting: Cardiology

## 2023-07-10 VITALS — BP 130/64 | HR 70 | Ht 61.0 in | Wt 114.4 lb

## 2023-07-10 DIAGNOSIS — N184 Chronic kidney disease, stage 4 (severe): Secondary | ICD-10-CM

## 2023-07-10 DIAGNOSIS — I10 Essential (primary) hypertension: Secondary | ICD-10-CM | POA: Insufficient documentation

## 2023-07-10 DIAGNOSIS — I2510 Atherosclerotic heart disease of native coronary artery without angina pectoris: Secondary | ICD-10-CM

## 2023-07-10 DIAGNOSIS — E782 Mixed hyperlipidemia: Secondary | ICD-10-CM | POA: Diagnosis not present

## 2023-07-10 NOTE — Patient Instructions (Signed)
 Medication Instructions:   Continue all current medications.   Labwork:  none  Testing/Procedures:  none  Follow-Up:  6 months   Any Other Special Instructions Will Be Listed Below (If Applicable).  You have been referred to:  [facility]   If you need a refill on your cardiac medications before your next appointment, please call your pharmacy.

## 2023-07-10 NOTE — Progress Notes (Signed)
 Clinical Summary Ms. Gladue is a 73 y.o.female seen today for follow up of the following medical problems.        1. CAD -  previously followed by [facility]. 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   - LVEF March 2011 reported normal LVEF. Echo 04/2011 LVEF 55-60%. - 12/2014 Lexiscan  without ischemia - 08/2016 echo LVEF 60-65%, grade II diastolic dysfunction - 09/2020 LVEF 60-65%, grade I,      -currerntly on plavix  per neuro, we have deferred antiplatelet agents to neurology given recurrent CVAs   - no recent chest pains. No SOB/DOE - compliant with meds   2. PAD - hx of right common femoral to popliteal bypass in [place]  July 2008 - followed by vascular     05/2020 stents to bilateral common iliac arteries - last seen 12/2021 with plans for 1 year f/u  - drug-coated balloon angioplasty of the origin of the right femoral to popliteal bypass graft by Dr. Charlotte Cookey on 01/29/2023 performed due to recurrent foot ulcer.    3. Hyperlipidemia - 02/2015 TC 148 TG 38 HDL 104 LDL 36   03/2019 TC 155 TG 63 HDL 87 LDL 55 09/2020 TC 154 TG 55 HDL 76 LDL 67 Jan 2023 TC 295 TG 54 HDL 91 LDL 71   - we had changed to crestor  but given renal dysfunction limited on doseing, changed back to atorvastatin  80mg  daily    02/2023 TC 108 TG 63 HDL 62 LDL 33     4. 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 Adrenal insufficiency - followed by endocrine     6. CKD - followed at [facility]     7. Chronic diastolic HF - echo 08/2016 LVEF 60-65%, grade II diastolic dysfunction. Mild to moderate RV dysfunction. PASP 23.  - no recent issues with edema, actually on florinef  for adrenal insufficiency history.    - denies any edema.    8. CVA - admit 09/2020 with CVA - was discharged with loop recorder - 04/2021 loop recorder check was benign. Loop removed 05/2022 due to discomfort   - she is plavix  alone perneuro     9. HTN - home bp's  120s/60s-70s   - renal held norvasc and started indapamide during 11/28/21 visit - defer further changes to neprhology.  Past Medical History:  Diagnosis Date   Cancer (HCC)    Squamous Cell   Diabetes mellitus without complication (HCC)    Hyperlipemia    Hypertension    Hypothyroidism    PAD (peripheral artery disease) (HCC)    Peripheral arterial disease (HCC)    Stroke (HCC)      Allergies  Allergen Reactions   Bactrim [Sulfamethoxazole-Trimethoprim] Other (See Comments)    Elevates potassium.    Tape Other (See Comments)    not allergic but causes sensitivity.   Epoetin Alfa Rash   Fluorescein Rash   Procrit [Epoetin (Alfa)] Rash     Current Outpatient Medications  Medication Sig Dispense Refill   acetaminophen  (TYLENOL ) 500 MG tablet Take 500 mg by mouth every 6 (six) hours as needed for mild pain or headache.      ascorbic Acid (VITAMIN C) 500 MG CPCR Take 500 mg by mouth daily. Timed release     atorvastatin  (LIPITOR ) 80 MG tablet TAKE 1 TABLET BY MOUTH EVERY DAY 90 tablet 1   Calcium  Carbonate (CALCIUM  600 PO) Take 1,200 mg by mouth  every morning.     Cholecalciferol (VITAMIN D3) 50 MCG (2000 UT) TABS Take 2,000 Units by mouth daily.     clopidogrel  (PLAVIX ) 75 MG tablet TAKE 1 TABLET BY MOUTH DAILY 90 tablet 3   ezetimibe (ZETIA) 10 MG tablet Take 10 mg by mouth at bedtime.     fludrocortisone  (FLORINEF ) 0.1 MG tablet Take 0.05 mg by mouth daily.     HUMALOG 100 UNIT/ML injection Inject into the skin See admin instructions. VIA INSULIN  PUMP     indapamide (LOZOL) 1.25 MG tablet Take 1.25 mg by mouth daily.     Insulin  Human (INSULIN  PUMP) SOLN Inject into the skin. HUMALOG 100 UNIT/ML  lispro     iron  polysaccharides (NIFEREX) 150 MG capsule Take 150 mg by mouth at bedtime.      levothyroxine  (SYNTHROID ) 112 MCG tablet Take 112 mcg by mouth daily.     magnesium  chloride (SLOW-MAG) 64 MG TBEC SR tablet Take 1-2 tablets by mouth See admin instructions. TAKE 1  TABLET IN THE MORNING & TAKE 2 TABLETS AT NIGHT.     Multiple Vitamin (MULTIVITAMIN WITH MINERALS) TABS tablet Take 1 tablet by mouth daily.     Multiple Vitamins-Minerals (PRESERVISION AREDS 2 PO) Take 1 tablet by mouth daily.      predniSONE  (DELTASONE ) 5 MG tablet Take 5 mg by mouth daily with breakfast.     sodium polystyrene (KAYEXALATE) powder Take by mouth 2 (two) times a week. 20 ml in Marlou Sims     No current facility-administered medications for this visit.     Past Surgical History:  Procedure Laterality Date   ABDOMINAL AORTOGRAM W/LOWER EXTREMITY N/A 06/09/2019   Procedure: ABDOMINAL AORTOGRAM W/LOWER EXTREMITY;  Surgeon: Margherita Shell, MD;  [facility];  Service: Cardiovascular;  Laterality: N/A;   ABDOMINAL AORTOGRAM W/LOWER EXTREMITY N/A 01/29/2023   Procedure: ABDOMINAL AORTOGRAM W/LOWER EXTREMITY;  Surgeon: Margherita Shell, MD;  [facility]: [facility];  Service: Cardiovascular;  Laterality: N/A;   APPENDECTOMY     CATARACT EXTRACTION Bilateral    CORONARY ARTERY BYPASS GRAFT     LOOP RECORDER INSERTION N/A 10/17/2020   Procedure: LOOP RECORDER INSERTION;  Surgeon: Verona Goodwill, MD;  Location: [facility] INVASIVE CV LAB;  Service: Cardiovascular;  Laterality: N/A;   PERIPHERAL VASCULAR BALLOON ANGIOPLASTY  01/29/2023   Procedure: PERIPHERAL VASCULAR BALLOON ANGIOPLASTY;  Surgeon: Margherita Shell, MD;  [facility];  Service: Cardiovascular;;   PERIPHERAL VASCULAR INTERVENTION Bilateral 06/09/2019   Procedure: PERIPHERAL VASCULAR INTERVENTION;  Surgeon: Margherita Shell, MD;  [facility]: [facility];  Service: Cardiovascular;  Laterality: Bilateral;   TONSILLECTOMY     YAG LASER APPLICATION Right 05/11/2013   Procedure: YAG LASER APPLICATION;  Surgeon: Clay Cummins, MD;  Location: [facility];  Service: Ophthalmology;  Laterality: Right;     Allergies  Allergen Reactions   Bactrim [Sulfamethoxazole-Trimethoprim] Other (See Comments)     Elevates potassium.    Tape Other (See Comments)    not allergic but causes sensitivity.   Epoetin Alfa Rash   Fluorescein Rash   Procrit [Epoetin (Alfa)] Rash      Family History  Problem Relation Age of Onset   Heart disease Mother    Hypertension Mother    Hypertension Father    Sleep apnea Neg Hx      Social History Ms. Baus reports that she has never smoked. She has never used smokeless tobacco. Ms. Beman reports no history  of alcohol  use.    Physical Examination Today's Vitals   07/10/23 1356  BP: 130/64  Pulse: 70  SpO2: 96%  Weight: 114 lb 6.4 oz (51.9 kg)  Height: 5' 1 (1.549 m)   Body mass index is 21.62 kg/m.  Gen: resting comfortably, no acute distress HEENT: no scleral icterus, pupils equal round and reactive, no palptable cervical adenopathy,  CV: RRR, 2/6 systolic murmur rusb, no jvd Resp: Clear to auscultation bilaterally GI: abdomen is soft, non-tender, non-distended, normal bowel sounds, no hepatosplenomegaly MSK: extremities are warm, no edema.  Skin: warm, no rash Neuro:  no focal deficits Psych: appropriate affect   Diagnostic Studies 04/2011 Echo FINDINGS:  LEFT VENTRICLE The left ventricular size is normal. There is normal left ventricular wall  thickness. Left ventricular systolic function is normal. LV ejection fraction  = 55-60%. Left ventricular filling pattern is indeterminate. The left  ventricular wall motion is normal. LV WALL MOTION -  RIGHT VENTRICLE The right ventricle is normal in size and function. The right ventricular  systolic function is normal. The right ventricular wall motion is normal.  LEFT ATRIUM The left atrium is borderline dilated.  RIGHT ATRIUM  Right atrial size is normal. - AORTIC VALVE The aortic valve is normal in structure and function. The aortic valve opens  well. Trace (trivial) aortic regurgitation. - MITRAL VALVE The mitral valve leaflets appear normal. There is no evidence of  stenosis,  fluttering, or prolapse. There is mild mitral regurgitation. - TRICUSPID VALVE Structurally normal tricuspid valve. There is mild tricuspid regurgitation. - PULMONIC VALVE The pulmonic valve is normal in structure and function. Trace pulmonic  valvular regurgitation. - ARTERIES The aortic root is normal size. - VENOUS Pulmonary venous flow pattern is blunted. IVC size was normal. - EFFUSION There is no pericardial effusion. - - MMode/2D Measurements & Calculations IVSd: 0.95 cm LVIDd: 3.9 cm LVPWd: 0.99 cm LVIDs: 2.4 cm LA dim: 3.8 cm Ao root: 2.5 cm EDV(MOD-sp4): 24.0 ml ESV(MOD-sp4): 11.0 ml LVOT diam: 1.5 cm LAV(MOD-sp4): 41.5 ml Doppler Measurements & Calculations MV E max vel: 114.0 cm/sec MV A max vel: 82.4 cm/sec MV E/A: 1.4 Med Peak E' Vel: 7.6 cm/sec Lat Peak E' Vel: 12.2 cm/sec E/Lat E`: 9.4 E/Med E`: 15.0 MV dec time: 0.16 sec SV(LVOT): 45.4 ml Ao mean PG: 7.1 mmHg AVA (VTI): 1.3 cm2 LV V1 VTI: 27.2 cm TR max vel: 230.8 cm/sec TR max PG: 21.3 mmHg RVSP(TR): 26.3 mmHg RAP systole: 5.0 mmHg    11/2014 echo Study Conclusions  - Left ventricle: The cavity size was normal. Systolic function was   normal. The estimated ejection fraction was in the range of 60%   to 65%. Wall motion was normal; there were no regional wall   motion abnormalities. Diastolic dysfunction, grade indeterminate.   Medial annular velocity is slightly low with elevated E/e&' ratio   of 18, indicative of elevated filling pressures. Mild to moderate   concentric left ventricular hypertrophy. - Aortic valve: Moderately calcified annulus. Trileaflet. There was   mild regurgitation. - Mitral valve: Mildly calcified annulus. Mildly thickened leaflets   . There was trivial regurgitation. - Right ventricle: Systolic function was normal. TAPSE: 17.6 mm . - Tricuspid valve: There was mild-moderate regurgitation. - Pulmonary arteries: Systolic pressure was mildly increased.  PA   peak pressure: 35 mm Hg (S).     12/2014 Lexiscan  MPI No diagnostic ST segment abnormalities. Rare PACs. Very small fixed perfusion defect at the inferior apex most consistent with  attenuation artifact rather than scar. No ischemic findings. This is a low risk study. Nuclear stress EF: 70%.   08/2016 echo Study Conclusions   - Left ventricle: The cavity size was normal. Wall thickness was   normal. Systolic function was normal. The estimated ejection   fraction was in the range of 60% to 65%. Features are consistent   with a pseudonormal left ventricular filling pattern, with   concomitant abnormal relaxation and increased filling pressure   (grade 2 diastolic dysfunction). Doppler parameters are   consistent with high ventricular filling pressure. - Aortic valve: Moderately calcified annulus. Trileaflet. There was   mild regurgitation. - Mitral valve: Calcified annulus. There was mild regurgitation. - Right ventricle: Systolic function was mildly to moderately   reduced.     09/2020 echo IMPRESSIONS     1. Left ventricular ejection fraction, by estimation, is 60 to 65%. The  left ventricle has normal function. The left ventricle has no regional  wall motion abnormalities. Left ventricular diastolic parameters are  consistent with Grade I diastolic  dysfunction (impaired relaxation).   2. Right ventricular systolic function is normal. The right ventricular  size is normal. There is normal pulmonary artery systolic pressure.   3. The mitral valve is normal in structure. Trivial mitral valve  regurgitation. No evidence of mitral stenosis.   4. The aortic valve is calcified. There is moderate calcification of the  aortic valve. There is moderate thickening of the aortic valve. Aortic  valve regurgitation is trivial. Mild to moderate aortic valve  sclerosis/calcification is present, without any   evidence of aortic stenosis.   5. The inferior vena cava is normal in size  with greater than 50%  respiratory variability, suggesting right atrial pressure of 3 mmHg.     Assessment and Plan   1. CAD - no ACE/ARB due to renal dysfunction - . On plavix  due to recurrent CVAs and most recently PAD intervention. Has not had a specific cardiac indication for plavix .  - denies any symptoms, continue current meds - EKG today shows SR, no ischemic changes     2. Hyperlipidemia -very high risk given CAD, PAD, multiple CVAs and , thus LDL goal would be <55 - LDL at goal, continue current meds   3. HTN -bp at goal, continue current meds  4. CKD IV - followed at [facility], for closer convenience would like to establish at [facility]. We will place referral.      Laurann Pollock, M.D.

## 2023-07-18 DIAGNOSIS — L84 Corns and callosities: Secondary | ICD-10-CM | POA: Diagnosis not present

## 2023-07-18 DIAGNOSIS — E1142 Type 2 diabetes mellitus with diabetic polyneuropathy: Secondary | ICD-10-CM | POA: Diagnosis not present

## 2023-07-22 DIAGNOSIS — E782 Mixed hyperlipidemia: Secondary | ICD-10-CM | POA: Diagnosis not present

## 2023-07-22 DIAGNOSIS — E875 Hyperkalemia: Secondary | ICD-10-CM | POA: Diagnosis not present

## 2023-07-22 DIAGNOSIS — E1021 Type 1 diabetes mellitus with diabetic nephropathy: Secondary | ICD-10-CM | POA: Diagnosis not present

## 2023-07-22 DIAGNOSIS — I6930 Unspecified sequelae of cerebral infarction: Secondary | ICD-10-CM | POA: Diagnosis not present

## 2023-07-31 DIAGNOSIS — H35372 Puckering of macula, left eye: Secondary | ICD-10-CM | POA: Diagnosis not present

## 2023-07-31 DIAGNOSIS — E103512 Type 1 diabetes mellitus with proliferative diabetic retinopathy with macular edema, left eye: Secondary | ICD-10-CM | POA: Diagnosis not present

## 2023-08-08 DIAGNOSIS — H353133 Nonexudative age-related macular degeneration, bilateral, advanced atrophic without subfoveal involvement: Secondary | ICD-10-CM | POA: Diagnosis not present

## 2023-08-08 DIAGNOSIS — H353212 Exudative age-related macular degeneration, right eye, with inactive choroidal neovascularization: Secondary | ICD-10-CM | POA: Diagnosis not present

## 2023-08-08 DIAGNOSIS — H35372 Puckering of macula, left eye: Secondary | ICD-10-CM | POA: Diagnosis not present

## 2023-08-08 DIAGNOSIS — Z794 Long term (current) use of insulin: Secondary | ICD-10-CM | POA: Diagnosis not present

## 2023-08-08 DIAGNOSIS — H353222 Exudative age-related macular degeneration, left eye, with inactive choroidal neovascularization: Secondary | ICD-10-CM | POA: Diagnosis not present

## 2023-08-08 DIAGNOSIS — E113553 Type 2 diabetes mellitus with stable proliferative diabetic retinopathy, bilateral: Secondary | ICD-10-CM | POA: Diagnosis not present

## 2023-08-10 DIAGNOSIS — N183 Chronic kidney disease, stage 3 unspecified: Secondary | ICD-10-CM | POA: Diagnosis not present

## 2023-08-10 DIAGNOSIS — E7800 Pure hypercholesterolemia, unspecified: Secondary | ICD-10-CM | POA: Diagnosis not present

## 2023-08-10 DIAGNOSIS — R109 Unspecified abdominal pain: Secondary | ICD-10-CM | POA: Diagnosis not present

## 2023-08-10 DIAGNOSIS — K7689 Other specified diseases of liver: Secondary | ICD-10-CM | POA: Diagnosis not present

## 2023-08-10 DIAGNOSIS — N3001 Acute cystitis with hematuria: Secondary | ICD-10-CM | POA: Diagnosis not present

## 2023-08-10 DIAGNOSIS — K529 Noninfective gastroenteritis and colitis, unspecified: Secondary | ICD-10-CM | POA: Diagnosis not present

## 2023-08-10 DIAGNOSIS — Z7902 Long term (current) use of antithrombotics/antiplatelets: Secondary | ICD-10-CM | POA: Diagnosis not present

## 2023-08-10 DIAGNOSIS — I130 Hypertensive heart and chronic kidney disease with heart failure and stage 1 through stage 4 chronic kidney disease, or unspecified chronic kidney disease: Secondary | ICD-10-CM | POA: Diagnosis not present

## 2023-08-10 DIAGNOSIS — K6389 Other specified diseases of intestine: Secondary | ICD-10-CM | POA: Diagnosis not present

## 2023-08-10 DIAGNOSIS — Z20822 Contact with and (suspected) exposure to covid-19: Secondary | ICD-10-CM | POA: Diagnosis not present

## 2023-08-10 DIAGNOSIS — N2889 Other specified disorders of kidney and ureter: Secondary | ICD-10-CM | POA: Diagnosis not present

## 2023-08-10 DIAGNOSIS — I2510 Atherosclerotic heart disease of native coronary artery without angina pectoris: Secondary | ICD-10-CM | POA: Diagnosis not present

## 2023-08-10 DIAGNOSIS — E1142 Type 2 diabetes mellitus with diabetic polyneuropathy: Secondary | ICD-10-CM | POA: Diagnosis not present

## 2023-08-10 DIAGNOSIS — K769 Liver disease, unspecified: Secondary | ICD-10-CM | POA: Diagnosis not present

## 2023-08-10 DIAGNOSIS — E1122 Type 2 diabetes mellitus with diabetic chronic kidney disease: Secondary | ICD-10-CM | POA: Diagnosis not present

## 2023-08-10 DIAGNOSIS — Z79890 Hormone replacement therapy (postmenopausal): Secondary | ICD-10-CM | POA: Diagnosis not present

## 2023-08-10 DIAGNOSIS — Z882 Allergy status to sulfonamides status: Secondary | ICD-10-CM | POA: Diagnosis not present

## 2023-08-10 DIAGNOSIS — I5032 Chronic diastolic (congestive) heart failure: Secondary | ICD-10-CM | POA: Diagnosis not present

## 2023-08-10 DIAGNOSIS — Z79899 Other long term (current) drug therapy: Secondary | ICD-10-CM | POA: Diagnosis not present

## 2023-08-10 DIAGNOSIS — K8689 Other specified diseases of pancreas: Secondary | ICD-10-CM | POA: Diagnosis not present

## 2023-08-10 DIAGNOSIS — R112 Nausea with vomiting, unspecified: Secondary | ICD-10-CM | POA: Diagnosis not present

## 2023-08-10 DIAGNOSIS — Z794 Long term (current) use of insulin: Secondary | ICD-10-CM | POA: Diagnosis not present

## 2023-08-10 DIAGNOSIS — Z91040 Latex allergy status: Secondary | ICD-10-CM | POA: Diagnosis not present

## 2023-08-13 DIAGNOSIS — N3000 Acute cystitis without hematuria: Secondary | ICD-10-CM | POA: Diagnosis not present

## 2023-08-13 DIAGNOSIS — Z6820 Body mass index (BMI) 20.0-20.9, adult: Secondary | ICD-10-CM | POA: Diagnosis not present

## 2023-08-13 DIAGNOSIS — K769 Liver disease, unspecified: Secondary | ICD-10-CM | POA: Diagnosis not present

## 2023-08-13 DIAGNOSIS — K529 Noninfective gastroenteritis and colitis, unspecified: Secondary | ICD-10-CM | POA: Diagnosis not present

## 2023-08-22 DIAGNOSIS — E1021 Type 1 diabetes mellitus with diabetic nephropathy: Secondary | ICD-10-CM | POA: Diagnosis not present

## 2023-08-22 DIAGNOSIS — E782 Mixed hyperlipidemia: Secondary | ICD-10-CM | POA: Diagnosis not present

## 2023-08-22 DIAGNOSIS — E875 Hyperkalemia: Secondary | ICD-10-CM | POA: Diagnosis not present

## 2023-08-22 DIAGNOSIS — I6930 Unspecified sequelae of cerebral infarction: Secondary | ICD-10-CM | POA: Diagnosis not present

## 2023-09-02 DIAGNOSIS — M810 Age-related osteoporosis without current pathological fracture: Secondary | ICD-10-CM | POA: Diagnosis not present

## 2023-09-02 DIAGNOSIS — Z13820 Encounter for screening for osteoporosis: Secondary | ICD-10-CM | POA: Diagnosis not present

## 2023-09-02 DIAGNOSIS — M8589 Other specified disorders of bone density and structure, multiple sites: Secondary | ICD-10-CM | POA: Diagnosis not present

## 2023-09-02 DIAGNOSIS — Z780 Asymptomatic menopausal state: Secondary | ICD-10-CM | POA: Diagnosis not present

## 2023-09-05 DIAGNOSIS — Z794 Long term (current) use of insulin: Secondary | ICD-10-CM | POA: Diagnosis not present

## 2023-09-05 DIAGNOSIS — H353133 Nonexudative age-related macular degeneration, bilateral, advanced atrophic without subfoveal involvement: Secondary | ICD-10-CM | POA: Diagnosis not present

## 2023-09-05 DIAGNOSIS — E113553 Type 2 diabetes mellitus with stable proliferative diabetic retinopathy, bilateral: Secondary | ICD-10-CM | POA: Diagnosis not present

## 2023-09-05 DIAGNOSIS — H353222 Exudative age-related macular degeneration, left eye, with inactive choroidal neovascularization: Secondary | ICD-10-CM | POA: Diagnosis not present

## 2023-09-05 DIAGNOSIS — H35372 Puckering of macula, left eye: Secondary | ICD-10-CM | POA: Diagnosis not present

## 2023-09-05 DIAGNOSIS — H353212 Exudative age-related macular degeneration, right eye, with inactive choroidal neovascularization: Secondary | ICD-10-CM | POA: Diagnosis not present

## 2023-09-13 DIAGNOSIS — R5383 Other fatigue: Secondary | ICD-10-CM | POA: Diagnosis not present

## 2023-09-13 DIAGNOSIS — E1021 Type 1 diabetes mellitus with diabetic nephropathy: Secondary | ICD-10-CM | POA: Diagnosis not present

## 2023-09-13 DIAGNOSIS — N184 Chronic kidney disease, stage 4 (severe): Secondary | ICD-10-CM | POA: Diagnosis not present

## 2023-09-13 DIAGNOSIS — K769 Liver disease, unspecified: Secondary | ICD-10-CM | POA: Diagnosis not present

## 2023-09-13 DIAGNOSIS — E7849 Other hyperlipidemia: Secondary | ICD-10-CM | POA: Diagnosis not present

## 2023-09-13 DIAGNOSIS — E559 Vitamin D deficiency, unspecified: Secondary | ICD-10-CM | POA: Diagnosis not present

## 2023-09-13 DIAGNOSIS — E039 Hypothyroidism, unspecified: Secondary | ICD-10-CM | POA: Diagnosis not present

## 2023-09-13 DIAGNOSIS — I10 Essential (primary) hypertension: Secondary | ICD-10-CM | POA: Diagnosis not present

## 2023-09-13 DIAGNOSIS — D559 Anemia due to enzyme disorder, unspecified: Secondary | ICD-10-CM | POA: Diagnosis not present

## 2023-09-13 DIAGNOSIS — E875 Hyperkalemia: Secondary | ICD-10-CM | POA: Diagnosis not present

## 2023-09-13 DIAGNOSIS — R634 Abnormal weight loss: Secondary | ICD-10-CM | POA: Diagnosis not present

## 2023-09-13 DIAGNOSIS — Z1231 Encounter for screening mammogram for malignant neoplasm of breast: Secondary | ICD-10-CM | POA: Diagnosis not present

## 2023-09-13 DIAGNOSIS — D631 Anemia in chronic kidney disease: Secondary | ICD-10-CM | POA: Diagnosis not present

## 2023-09-13 DIAGNOSIS — E279 Disorder of adrenal gland, unspecified: Secondary | ICD-10-CM | POA: Diagnosis not present

## 2023-09-16 ENCOUNTER — Ambulatory Visit (HOSPITAL_COMMUNITY)
Admission: RE | Admit: 2023-09-16 | Discharge: 2023-09-16 | Disposition: A | Discharge status: Home / Self Care | Payer: Medicare Other | Source: Ambulatory Visit | Attending: Physician Assistant | Admitting: Physician Assistant

## 2023-09-16 ENCOUNTER — Ambulatory Visit (INDEPENDENT_AMBULATORY_CARE_PROVIDER_SITE_OTHER): Payer: Medicare Other | Admitting: Physician Assistant

## 2023-09-16 ENCOUNTER — Ambulatory Visit (HOSPITAL_BASED_OUTPATIENT_CLINIC_OR_DEPARTMENT_OTHER)
Admission: RE | Admit: 2023-09-16 | Discharge: 2023-09-16 | Disposition: A | Discharge status: Home / Self Care | Payer: Medicare Other | Source: Ambulatory Visit | Attending: Physician Assistant | Admitting: Physician Assistant

## 2023-09-16 VITALS — BP 147/73 | HR 71 | Temp 97.7°F | Wt 113.1 lb

## 2023-09-16 DIAGNOSIS — L97909 Non-pressure chronic ulcer of unspecified part of unspecified lower leg with unspecified severity: Secondary | ICD-10-CM | POA: Diagnosis not present

## 2023-09-16 DIAGNOSIS — Z95828 Presence of other vascular implants and grafts: Secondary | ICD-10-CM | POA: Diagnosis not present

## 2023-09-16 DIAGNOSIS — I739 Peripheral vascular disease, unspecified: Secondary | ICD-10-CM | POA: Insufficient documentation

## 2023-09-16 DIAGNOSIS — I70299 Other atherosclerosis of native arteries of extremities, unspecified extremity: Secondary | ICD-10-CM | POA: Insufficient documentation

## 2023-09-16 LAB — VAS US ABI WITH/WO TBI

## 2023-09-16 NOTE — Progress Notes (Signed)
 Office Note     CC:  follow up Requesting Provider:  Lari Elspeth BRAVO, MD  HPI: Kendra Walker is a 73 y.o. ([birth year]) female who presents for routine follow up of PAD. Most recently in January she underwent drug-coated balloon angioplasty of the origin of the right femoral to popliteal bypass graft by Dr. Serene. This was performed due to recurrent ulcer of the right ankle. At her last follow up in February her right ankle wound had healed.   Pt has hx of right femoral to above-the-knee popliteal bypass with vein in 2008 in [place] . She has also had bilateral common iliac stenting by Dr. Serene in May 2021 due to disabling claudication.  Today she is here with her husband. She has no concerns today. Overall she says her legs are feeling pretty good. Her right ankle wound remains healed. She denies any new pain on ambulation. No pain at rest or tissue loss.   The pt is on a statin for cholesterol management.    The pt is not on an aspirin .  Other AC:  Plavix  The pt is not on medication for hypertension.  The pt is  on medication for diabetes. Tobacco hx:  never  Past Medical History:  Diagnosis Date   Cancer (HCC)    Squamous Cell   Diabetes mellitus without complication (HCC)    Hyperlipemia    Hypertension    Hypothyroidism    PAD (peripheral artery disease) (HCC)    Peripheral arterial disease (HCC)    Stroke [facility])     Past Surgical History:  Procedure Laterality Date   ABDOMINAL AORTOGRAM W/LOWER EXTREMITY N/A 06/09/2019   Procedure: ABDOMINAL AORTOGRAM W/LOWER EXTREMITY;  Surgeon: Serene Gaile ORN, MD;  Location: [facility];  Service: Cardiovascular;  Laterality: N/A;   ABDOMINAL AORTOGRAM W/LOWER EXTREMITY N/A 01/29/2023   Procedure: ABDOMINAL AORTOGRAM W/LOWER EXTREMITY;  Surgeon: Serene Gaile ORN, MD;  Location: [facility];  Service: Cardiovascular;  Laterality: N/A;   APPENDECTOMY     CATARACT EXTRACTION Bilateral    CORONARY ARTERY BYPASS GRAFT      LOOP RECORDER INSERTION N/A 10/17/2020   Procedure: LOOP RECORDER INSERTION;  Surgeon: Fernande Elspeth BROCKS, MD;  Location: [facility] INVASIVE CV [facility];  Service: Cardiovascular;  Laterality: N/A;   PERIPHERAL VASCULAR BALLOON ANGIOPLASTY  01/29/2023   Procedure: PERIPHERAL VASCULAR BALLOON ANGIOPLASTY;  Surgeon: Serene Gaile ORN, MD;  Location: [facility];  Service: Cardiovascular;;   PERIPHERAL VASCULAR INTERVENTION Bilateral 06/09/2019   Procedure: PERIPHERAL VASCULAR INTERVENTION;  Surgeon: Serene Gaile ORN, MD;  Location: [facility];  Service: Cardiovascular;  Laterality: Bilateral;   TONSILLECTOMY     YAG LASER APPLICATION Right 05/11/2013   Procedure: YAG LASER APPLICATION;  Surgeon: Dow JULIANNA Burke, MD;  Location: [facility];  Service: Ophthalmology;  Laterality: Right;    Social History   Socioeconomic History   Marital status: Married    Spouse name: Beryl   Number of children: Not on file   Years of education: Not on file   Highest education level: Not on file  Occupational History   Not on file  Tobacco Use   Smoking status: Never   Smokeless tobacco: Never  Vaping Use   Vaping status: Never Used  Substance and Sexual Activity   Alcohol  use: No    Alcohol /week: 0.0 standard drinks of alcohol    Drug use: No   Sexual activity: Not on file  Other Topics Concern   Not on file  Social History Narrative   Lives with husband   Social Drivers of Corporate investment banker Strain: Not on file  Food Insecurity: Low Risk  (07/08/2023)   Received from [facility]   Hunger Vital Sign    Within the past 12 months, you worried that your food would run out before you got money to buy more: Never true    Within the past 12 months, the food you bought just didn't last and you didn't have money to get more. : Never true  Transportation Needs: No Transportation Needs (07/08/2023)   Received from [facility]    In the past 12 months, has lack of reliable  transportation kept you from medical appointments, meetings, work or from getting things needed for daily living? : No  Physical Activity: Not on file  Stress: No Stress Concern Present (10/24/2018)   Received from [facility] - Occupational Stress Questionnaire    Feeling of Stress : Not at all  Social Connections: Unknown (10/24/2018)   Received from [facility]   Social Connection and Isolation Panel    Frequency of Communication with Friends and Family: Not on file    Frequency of Social Gatherings with Friends and Family: Not on file    Attends Religious Services: Not on file    Active Member of Clubs or Organizations: Not on file    How often do you attend meetings of the clubs or organizations you belong to?: Not asked    Are you married, widowed, divorced, separated, never married, or living with a partner?: Not asked  Intimate Partner Violence: Not At Risk (10/24/2018)   Received from [facility]   Humiliation, Afraid, Rape, and Kick questionnaire    Within the last year, have you been afraid of your partner or ex-partner?: No    Within the last year, have you been humiliated or emotionally abused in other ways by your partner or ex-partner?: No    Within the last year, have you been kicked, hit, slapped, or otherwise physically hurt by your partner or ex-partner?: No    Within the last year, have you been raped or forced to have any kind of sexual activity by your partner or ex-partner?: No    Family History  Problem Relation Age of Onset   Heart disease Mother    Hypertension Mother    Hypertension Father    Sleep apnea Neg Hx     Current Outpatient Medications  Medication Sig Dispense Refill   acetaminophen  (TYLENOL ) 500 MG tablet Take 500 mg by mouth every 6 (six) hours as needed for mild pain or headache.      ascorbic Acid (VITAMIN C) 500 MG CPCR Take 500 mg by mouth daily. Timed release     atorvastatin  (LIPITOR )  80 MG tablet TAKE 1 TABLET BY MOUTH EVERY DAY 90 tablet 1   Calcium  Carb-Cholecalciferol 600-10 MG-MCG TABS Take 600 mg by mouth daily.     Calcium  Carbonate (CALCIUM  600 PO) Take 1,200 mg by mouth every morning.     Cholecalciferol (VITAMIN D3) 50 MCG (2000 UT) TABS Take 2,000 Units by mouth daily.     clopidogrel  (PLAVIX ) 75 MG tablet TAKE 1 TABLET BY MOUTH DAILY 90 tablet 3   Continuous Glucose Sensor (DEXCOM G7 SENSOR) MISC Inject 1 sensor to the skin every 10 days for continuous glucose monitoring.     ezetimibe (ZETIA) 10 MG tablet Take  10 mg by mouth at bedtime.     fludrocortisone  (FLORINEF ) 0.1 MG tablet Take 0.05 mg by mouth daily.     HUMALOG 100 UNIT/ML injection Inject into the skin See admin instructions. VIA INSULIN  PUMP     indapamide (LOZOL) 1.25 MG tablet Take 1.25 mg by mouth daily.     Insulin  Human (INSULIN  PUMP) SOLN Inject into the skin. HUMALOG 100 UNIT/ML  lispro     iron  polysaccharides (NIFEREX) 150 MG capsule Take 150 mg by mouth at bedtime.      levothyroxine  (SYNTHROID ) 112 MCG tablet Take 112 mcg by mouth daily.     magnesium  chloride (SLOW-MAG) 64 MG TBEC SR tablet Take 1-2 tablets by mouth See admin instructions. TAKE 1 TABLET IN THE MORNING & TAKE 2 TABLETS AT NIGHT.     Multiple Vitamin (MULTIVITAMIN WITH MINERALS) TABS tablet Take 1 tablet by mouth daily.     Multiple Vitamins-Minerals (PRESERVISION AREDS 2 PO) Take 1 tablet by mouth daily.      predniSONE  (DELTASONE ) 5 MG tablet Take 5 mg by mouth daily with breakfast.     sodium polystyrene (KAYEXALATE) powder Take by mouth 2 (two) times a week. 20 ml in [place]     No current facility-administered medications for this visit.    Allergies  Allergen Reactions   Bactrim [Sulfamethoxazole-Trimethoprim] Other (See Comments)    Elevates potassium.    Tape Other (See Comments)    not allergic but causes sensitivity.   Epoetin Alfa Rash   Fluorescein Rash   Procrit [Epoetin (Alfa)] Rash     REVIEW OF  SYSTEMS:  Negative unless noted in HPI [X]  denotes positive finding, [ ]  denotes negative finding Cardiac  Comments:  Chest pain or chest pressure:    Shortness of breath upon exertion:    Short of breath when lying flat:    Irregular heart rhythm:        Vascular    Pain in calf, thigh, or hip brought on by ambulation:    Pain in feet at night that wakes you up from your sleep:     Blood clot in your veins:    Leg swelling:         Pulmonary    Oxygen at home:    Productive cough:     Wheezing:         Neurologic    Sudden weakness in arms or legs:     Sudden numbness in arms or legs:     Sudden onset of difficulty speaking or slurred speech:    Temporary loss of vision in one eye:     Problems with dizziness:         Gastrointestinal    Blood in stool:     Vomited blood:         Genitourinary    Burning when urinating:     Blood in urine:        Psychiatric    Major depression:         Hematologic    Bleeding problems:    Problems with blood clotting too easily:        Skin    Rashes or ulcers:        Constitutional    Fever or chills:      PHYSICAL EXAMINATION:  Vitals:   09/16/23 1024  BP: (!) 147/73  Pulse: 71  Temp: 97.7 F (36.5 C)   General:  WDWN in NAD; vital signs documented above Gait:  Not observed HENT: WNL, normocephalic Pulmonary: normal non-labored breathing Cardiac: regular HR Abdomen: soft, NT, no masses Vascular Exam/Pulses: 2+ femoral pulses, 2+ right DP, left PT palpable. Feet warm and well perfused Extremities: without ischemic changes, without Gangrene , without cellulitis; without open wounds;  Musculoskeletal: no muscle wasting or atrophy  Neurologic: A&O X 3 Psychiatric:  The pt has Normal affect.   Non-Invasive Vascular Imaging:   +-------+-----------+-----------+------------+------------+  ABI/TBIToday's ABIToday's TBIPrevious ABIPrevious TBI  +-------+-----------+-----------+------------+------------+   Right [place]         0.72       1.25        0.48          +-------+-----------+-----------+------------+------------+  Left  [place]         0.84       0.97        0.54          +-------+-----------+-----------+------------+------------+   VAS US  Aorta/IVC/Iliacs: Summary:  IVC/Iliac: Patent bilateral common iliac artery stents.   VAS US  Lower extremity bypass graft duplex: Summary:  Right: Patent right femoral to politeal bypass graft with no visualized stenosis.  30 - 49 % stenosis in the outflow segment of graft.   ASSESSMENT/PLAN:: 73 y.o. female here for routine follow up of PAD. Most recently in January she underwent drug-coated balloon angioplasty of the origin of the right femoral to popliteal bypass graft by Dr. Serene. This was performed due to recurrent ulcer of the right ankle. Her right ankle ulcer remains healed. She also has history of bilateral iliac artery stents. On duplex her stents are widely patent. Her right lower extremity bypass graft is also patent. Her ABI's overall are stable, TBI's increased.  - Continue Statin and Plavix   -she will follow up again in 6 months with ABI, RLE bypass graft duplex and Aortoa/iliac duplex   Teretha Damme, PA-C [facility] [phone]  Clinic MD:   Serene

## 2023-09-18 ENCOUNTER — Other Ambulatory Visit: Payer: Self-pay

## 2023-09-18 DIAGNOSIS — I70213 Atherosclerosis of native arteries of extremities with intermittent claudication, bilateral legs: Secondary | ICD-10-CM

## 2023-09-19 DIAGNOSIS — E039 Hypothyroidism, unspecified: Secondary | ICD-10-CM | POA: Diagnosis not present

## 2023-09-19 DIAGNOSIS — I10 Essential (primary) hypertension: Secondary | ICD-10-CM | POA: Diagnosis not present

## 2023-09-19 DIAGNOSIS — Z6820 Body mass index (BMI) 20.0-20.9, adult: Secondary | ICD-10-CM | POA: Diagnosis not present

## 2023-09-19 DIAGNOSIS — E1021 Type 1 diabetes mellitus with diabetic nephropathy: Secondary | ICD-10-CM | POA: Diagnosis not present

## 2023-09-19 DIAGNOSIS — E875 Hyperkalemia: Secondary | ICD-10-CM | POA: Diagnosis not present

## 2023-09-20 DIAGNOSIS — E782 Mixed hyperlipidemia: Secondary | ICD-10-CM | POA: Diagnosis not present

## 2023-09-20 DIAGNOSIS — I6930 Unspecified sequelae of cerebral infarction: Secondary | ICD-10-CM | POA: Diagnosis not present

## 2023-09-20 DIAGNOSIS — E875 Hyperkalemia: Secondary | ICD-10-CM | POA: Diagnosis not present

## 2023-09-20 DIAGNOSIS — E1021 Type 1 diabetes mellitus with diabetic nephropathy: Secondary | ICD-10-CM | POA: Diagnosis not present

## 2023-09-26 DIAGNOSIS — L603 Nail dystrophy: Secondary | ICD-10-CM | POA: Diagnosis not present

## 2023-09-26 DIAGNOSIS — E1142 Type 2 diabetes mellitus with diabetic polyneuropathy: Secondary | ICD-10-CM | POA: Diagnosis not present

## 2023-09-26 DIAGNOSIS — L84 Corns and callosities: Secondary | ICD-10-CM | POA: Diagnosis not present

## 2023-10-01 ENCOUNTER — Other Ambulatory Visit (HOSPITAL_COMMUNITY): Payer: Self-pay | Admitting: Nephrology

## 2023-10-01 DIAGNOSIS — E1122 Type 2 diabetes mellitus with diabetic chronic kidney disease: Secondary | ICD-10-CM | POA: Diagnosis not present

## 2023-10-01 DIAGNOSIS — E1022 Type 1 diabetes mellitus with diabetic chronic kidney disease: Secondary | ICD-10-CM | POA: Diagnosis not present

## 2023-10-01 DIAGNOSIS — N184 Chronic kidney disease, stage 4 (severe): Secondary | ICD-10-CM

## 2023-10-01 DIAGNOSIS — I10 Essential (primary) hypertension: Secondary | ICD-10-CM | POA: Diagnosis not present

## 2023-10-08 ENCOUNTER — Ambulatory Visit (HOSPITAL_COMMUNITY)
Admission: RE | Admit: 2023-10-08 | Discharge: 2023-10-08 | Disposition: A | Discharge status: Home / Self Care | Source: Ambulatory Visit | Attending: Nephrology | Admitting: Nephrology

## 2023-10-08 DIAGNOSIS — E1122 Type 2 diabetes mellitus with diabetic chronic kidney disease: Secondary | ICD-10-CM | POA: Diagnosis not present

## 2023-10-08 DIAGNOSIS — N184 Chronic kidney disease, stage 4 (severe): Secondary | ICD-10-CM | POA: Insufficient documentation

## 2023-10-08 DIAGNOSIS — N134 Hydroureter: Secondary | ICD-10-CM | POA: Diagnosis not present

## 2023-10-08 DIAGNOSIS — N1330 Unspecified hydronephrosis: Secondary | ICD-10-CM | POA: Diagnosis not present

## 2023-10-14 DIAGNOSIS — E271 Primary adrenocortical insufficiency: Secondary | ICD-10-CM | POA: Diagnosis not present

## 2023-10-14 DIAGNOSIS — E063 Autoimmune thyroiditis: Secondary | ICD-10-CM | POA: Diagnosis not present

## 2023-10-14 DIAGNOSIS — E1065 Type 1 diabetes mellitus with hyperglycemia: Secondary | ICD-10-CM | POA: Diagnosis not present

## 2023-10-18 DIAGNOSIS — E875 Hyperkalemia: Secondary | ICD-10-CM | POA: Diagnosis not present

## 2023-10-18 DIAGNOSIS — I6930 Unspecified sequelae of cerebral infarction: Secondary | ICD-10-CM | POA: Diagnosis not present

## 2023-10-18 DIAGNOSIS — E1021 Type 1 diabetes mellitus with diabetic nephropathy: Secondary | ICD-10-CM | POA: Diagnosis not present

## 2023-10-18 DIAGNOSIS — E782 Mixed hyperlipidemia: Secondary | ICD-10-CM | POA: Diagnosis not present

## 2023-10-21 DIAGNOSIS — R300 Dysuria: Secondary | ICD-10-CM | POA: Diagnosis not present

## 2023-10-21 DIAGNOSIS — N390 Urinary tract infection, site not specified: Secondary | ICD-10-CM | POA: Diagnosis not present

## 2023-10-21 DIAGNOSIS — Z6820 Body mass index (BMI) 20.0-20.9, adult: Secondary | ICD-10-CM | POA: Diagnosis not present

## 2023-10-22 DIAGNOSIS — Z23 Encounter for immunization: Secondary | ICD-10-CM | POA: Diagnosis not present

## 2023-10-25 DIAGNOSIS — R160 Hepatomegaly, not elsewhere classified: Secondary | ICD-10-CM | POA: Diagnosis not present

## 2023-10-25 DIAGNOSIS — N2889 Other specified disorders of kidney and ureter: Secondary | ICD-10-CM | POA: Diagnosis not present

## 2023-10-25 DIAGNOSIS — N200 Calculus of kidney: Secondary | ICD-10-CM | POA: Diagnosis not present

## 2023-11-01 ENCOUNTER — Other Ambulatory Visit: Payer: Self-pay | Admitting: Cardiology

## 2023-11-05 DIAGNOSIS — I10 Essential (primary) hypertension: Secondary | ICD-10-CM | POA: Diagnosis not present

## 2023-11-05 DIAGNOSIS — E1022 Type 1 diabetes mellitus with diabetic chronic kidney disease: Secondary | ICD-10-CM | POA: Diagnosis not present

## 2023-11-05 DIAGNOSIS — N184 Chronic kidney disease, stage 4 (severe): Secondary | ICD-10-CM | POA: Diagnosis not present

## 2023-11-22 DIAGNOSIS — E1021 Type 1 diabetes mellitus with diabetic nephropathy: Secondary | ICD-10-CM | POA: Diagnosis not present

## 2023-11-22 DIAGNOSIS — E782 Mixed hyperlipidemia: Secondary | ICD-10-CM | POA: Diagnosis not present

## 2023-11-22 DIAGNOSIS — E875 Hyperkalemia: Secondary | ICD-10-CM | POA: Diagnosis not present

## 2023-11-22 DIAGNOSIS — I6930 Unspecified sequelae of cerebral infarction: Secondary | ICD-10-CM | POA: Diagnosis not present

## 2023-12-12 DIAGNOSIS — N184 Chronic kidney disease, stage 4 (severe): Secondary | ICD-10-CM | POA: Diagnosis not present

## 2023-12-12 DIAGNOSIS — E1022 Type 1 diabetes mellitus with diabetic chronic kidney disease: Secondary | ICD-10-CM | POA: Diagnosis not present

## 2023-12-12 DIAGNOSIS — I10 Essential (primary) hypertension: Secondary | ICD-10-CM | POA: Diagnosis not present

## 2023-12-12 DIAGNOSIS — E1142 Type 2 diabetes mellitus with diabetic polyneuropathy: Secondary | ICD-10-CM | POA: Diagnosis not present

## 2023-12-12 DIAGNOSIS — L603 Nail dystrophy: Secondary | ICD-10-CM | POA: Diagnosis not present

## 2023-12-12 DIAGNOSIS — L84 Corns and callosities: Secondary | ICD-10-CM | POA: Diagnosis not present

## 2023-12-18 DIAGNOSIS — E1021 Type 1 diabetes mellitus with diabetic nephropathy: Secondary | ICD-10-CM | POA: Diagnosis not present

## 2023-12-18 DIAGNOSIS — N1832 Chronic kidney disease, stage 3b: Secondary | ICD-10-CM | POA: Diagnosis not present

## 2023-12-18 DIAGNOSIS — E7849 Other hyperlipidemia: Secondary | ICD-10-CM | POA: Diagnosis not present

## 2023-12-18 DIAGNOSIS — E875 Hyperkalemia: Secondary | ICD-10-CM | POA: Diagnosis not present

## 2023-12-18 DIAGNOSIS — E039 Hypothyroidism, unspecified: Secondary | ICD-10-CM | POA: Diagnosis not present

## 2023-12-20 DIAGNOSIS — E782 Mixed hyperlipidemia: Secondary | ICD-10-CM | POA: Diagnosis not present

## 2023-12-20 DIAGNOSIS — E875 Hyperkalemia: Secondary | ICD-10-CM | POA: Diagnosis not present

## 2023-12-20 DIAGNOSIS — I6930 Unspecified sequelae of cerebral infarction: Secondary | ICD-10-CM | POA: Diagnosis not present

## 2023-12-20 DIAGNOSIS — E1021 Type 1 diabetes mellitus with diabetic nephropathy: Secondary | ICD-10-CM | POA: Diagnosis not present

## 2023-12-24 ENCOUNTER — Ambulatory Visit: Attending: Cardiology | Admitting: Cardiology

## 2023-12-24 ENCOUNTER — Encounter: Payer: Self-pay | Admitting: Cardiology

## 2023-12-24 VITALS — BP 132/58 | HR 58 | Ht 61.5 in | Wt 117.0 lb

## 2023-12-24 DIAGNOSIS — I2510 Atherosclerotic heart disease of native coronary artery without angina pectoris: Secondary | ICD-10-CM | POA: Insufficient documentation

## 2023-12-24 DIAGNOSIS — I10 Essential (primary) hypertension: Secondary | ICD-10-CM | POA: Insufficient documentation

## 2023-12-24 DIAGNOSIS — E782 Mixed hyperlipidemia: Secondary | ICD-10-CM | POA: Insufficient documentation

## 2023-12-24 NOTE — Patient Instructions (Signed)
 Medication Instructions:  Continue all current medications.   Labwork: none  Testing/Procedures: none  Follow-Up: 6 months   Any Other Special Instructions Will Be Listed Below (If Applicable).   If you need a refill on your cardiac medications before your next appointment, please call your pharmacy.

## 2023-12-24 NOTE — Progress Notes (Signed)
 Clinical Summary Kendra Walker is a 73 y.o.female seen today for follow up of the following medical problems.      1. CAD -  previously followed by [facility]. 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   - LVEF March 2011 reported normal LVEF. Echo 04/2011 LVEF 55-60%. - 12/2014 Lexiscan  without ischemia - 08/2016 echo LVEF 60-65%, grade II diastolic dysfunction - 09/2020 LVEF 60-65%, grade I     -currerntly on plavix  per neuro, we have deferred antiplatelet agents to neurology given recurrent CVAs - denies any chest pains, no SOB/DOE - compliant with meds     2. PAD - hx of right common femoral to popliteal bypass in [place]  July 2008 - followed by vascular     05/2020 stents to bilateral common iliac arteries - last seen 12/2021 with plans for 1 year f/u   - drug-coated balloon angioplasty of the origin of the right femoral to popliteal bypass graft by Dr. Serene on 01/29/2023 performed due to recurrent foot ulcer.    3. 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 Adrenal insufficiency - followed by endocrine     6. CKD - followed at [facility]     7. Chronic diastolic HF - echo 08/2016 LVEF 60-65%, grade II diastolic dysfunction. Mild to moderate RV dysfunction. PASP 23.  - no recent issues with edema, actually on florinef  for adrenal insufficiency history.    - no edema.    8. CVA - admit 09/2020 with CVA - was discharged with loop recorder - 04/2021 loop recorder check was benign. Loop removed 05/2022 due to discomfort   - she is plavix  alone perneuro     9. HTN - home bp's 120s/60s-70s    - renal held norvasc and started indapamide during 11/28/21 visit - defer further changes to neprhology.  Past Medical History:  Diagnosis Date   Cancer (HCC)    Squamous Cell   Diabetes mellitus without complication (HCC)    Hyperlipemia    Hypertension    Hypothyroidism    PAD (peripheral artery disease)    Peripheral arterial disease    Stroke (HCC)      Allergies  Allergen Reactions   Bactrim [Sulfamethoxazole-Trimethoprim] Other (See Comments)    Elevates potassium.    Tape Other (See Comments)    not allergic but causes sensitivity.   Epoetin Alfa Rash   Fluorescein Rash   Procrit [Epoetin (Alfa)] Rash     Current Outpatient Medications  Medication Sig Dispense Refill   acetaminophen  (TYLENOL ) 500 MG tablet Take 500 mg by mouth every 6 (six) hours as needed for mild pain or headache.      ascorbic Acid (VITAMIN C) 500 MG CPCR Take 500 mg by mouth daily. Timed release     atorvastatin  (LIPITOR ) 80 MG tablet TAKE 1 TABLET BY MOUTH EVERY DAY 90 tablet 1   Calcium  Carb-Cholecalciferol 600-10 MG-MCG TABS Take 600 mg by mouth daily.  Calcium  Carbonate (CALCIUM  600 PO) Take 1,200 mg by mouth every morning.     Cholecalciferol (VITAMIN D3) 50 MCG (2000 UT) TABS Take 2,000 Units by mouth daily.     clopidogrel  (PLAVIX ) 75 MG tablet TAKE 1 TABLET BY MOUTH DAILY 90 tablet 3   Continuous Glucose Sensor (DEXCOM G7 SENSOR) MISC Inject 1 sensor to the skin every 10 days for continuous glucose monitoring.     ezetimibe (ZETIA) 10 MG tablet Take 10 mg by mouth at bedtime.     fludrocortisone  (FLORINEF ) 0.1 MG tablet Take 0.05 mg by mouth daily.     HUMALOG 100 UNIT/ML injection Inject into the skin See admin instructions. VIA INSULIN  PUMP     indapamide (LOZOL) 1.25 MG tablet Take 1.25 mg by mouth daily.     Insulin  Human (INSULIN  PUMP) SOLN Inject into the skin. HUMALOG 100 UNIT/ML  lispro     iron  polysaccharides (NIFEREX) 150 MG capsule Take 150 mg by mouth at bedtime.       levothyroxine  (SYNTHROID ) 112 MCG tablet Take 112 mcg by mouth daily.     magnesium  chloride (SLOW-MAG) 64 MG TBEC SR tablet Take 1-2 tablets by mouth See admin instructions. TAKE 1 TABLET IN THE MORNING & TAKE 2 TABLETS AT NIGHT.     Multiple Vitamin (MULTIVITAMIN WITH MINERALS) TABS tablet Take 1 tablet by mouth daily.     Multiple Vitamins-Minerals (PRESERVISION AREDS 2 PO) Take 1 tablet by mouth daily.      predniSONE  (DELTASONE ) 5 MG tablet Take 5 mg by mouth daily with breakfast.     sodium polystyrene (KAYEXALATE) powder Take by mouth 2 (two) times a week. 20 ml in [place]     No current facility-administered medications for this visit.     Past Surgical History:  Procedure Laterality Date   ABDOMINAL AORTOGRAM W/LOWER EXTREMITY N/A 06/09/2019   Procedure: ABDOMINAL AORTOGRAM W/LOWER EXTREMITY;  Surgeon: Serene Gaile ORN, MD;  Location: [facility];  Service: Cardiovascular;  Laterality: N/A;   ABDOMINAL AORTOGRAM W/LOWER EXTREMITY N/A 01/29/2023   Procedure: ABDOMINAL AORTOGRAM W/LOWER EXTREMITY;  Surgeon: Serene Gaile ORN, MD;  Location: [facility];  Service: Cardiovascular;  Laterality: N/A;   APPENDECTOMY     CATARACT EXTRACTION Bilateral    CORONARY ARTERY BYPASS GRAFT     LOOP RECORDER INSERTION N/A 10/17/2020   Procedure: LOOP RECORDER INSERTION;  Surgeon: Fernande Elspeth BROCKS, MD;  Location: [facility]IVE CV [facility];  Service: Cardiovascular;  Laterality: N/A;   PERIPHERAL VASCULAR BALLOON ANGIOPLASTY  01/29/2023   Procedure: PERIPHERAL VASCULAR BALLOON ANGIOPLASTY;  Surgeon: Serene Gaile ORN, MD;  Location: [facility];  Service: Cardiovascular;;   PERIPHERAL VASCULAR INTERVENTION Bilateral 06/09/2019   Procedure: PERIPHERAL VASCULAR INTERVENTION;  Surgeon: Serene Gaile ORN, MD;  Location: [facility];  Service: Cardiovascular;  Laterality: Bilateral;   TONSILLECTOMY     YAG LASER APPLICATION Right 05/11/2013   Procedure: YAG LASER APPLICATION;  Surgeon: Dow JULIANNA Burke, MD;  Location: [facility];  Service: Ophthalmology;  Laterality: Right;     Allergies  Allergen Reactions   Bactrim [Sulfamethoxazole-Trimethoprim] Other (See Comments)    Elevates potassium.    Tape Other (See Comments)    not allergic but causes sensitivity.   Epoetin Alfa Rash   Fluorescein Rash   Procrit [Epoetin (Alfa)] Rash      Family History  Problem Relation Age of Onset   Heart disease Mother    Hypertension Mother    Hypertension  Father    Sleep apnea Neg Hx      Social History Ms. Balthazar reports that she has never smoked. She has never used smokeless tobacco. Ms. Paden reports no history of alcohol  use.   Review of Systems CONSTITUTIONAL: No weight loss, fever, chills, weakness or fatigue.  HEENT: Eyes: No visual loss, blurred vision, double vision or yellow sclerae.No hearing loss, sneezing, congestion, runny nose or sore throat.  SKIN: No rash or itching.  CARDIOVASCULAR:  RESPIRATORY: No shortness of breath, cough or sputum.  GASTROINTESTINAL: No anorexia, nausea, vomiting or diarrhea. No abdominal pain or blood.  GENITOURINARY: No burning on urination, no polyuria NEUROLOGICAL: No headache, dizziness, syncope, paralysis, ataxia, numbness or tingling in the extremities. No change in bowel or bladder control.  MUSCULOSKELETAL: No muscle, back pain, joint pain or stiffness.  LYMPHATICS: No enlarged nodes. No history of splenectomy.  PSYCHIATRIC: No history of depression or anxiety.  ENDOCRINOLOGIC: No reports of sweating, cold or heat intolerance. No polyuria or polydipsia.  SABRA   Physical Examination Today's Vitals   12/24/23 1033  BP: (!) 132/58  Pulse: (!) 58  SpO2: 99%  Weight: 117 lb (53.1 kg)  Height: 5' 1.5 (1.562 m)   Body mass index is 21.75 kg/m.  Gen: resting comfortably, no acute distress HEENT: no scleral icterus, pupils equal round and reactive, no palptable cervical adenopathy,  CV: RRR, no mrg, no jvd Resp: Clear to  auscultation bilaterally GI: abdomen is soft, non-tender, non-distended, normal bowel sounds, no hepatosplenomegaly MSK: extremities are warm, no edema.  Skin: warm, no rash Neuro:  no focal deficits Psych: appropriate affect   Diagnostic Studies  04/2011 Echo FINDINGS:  LEFT VENTRICLE The left ventricular size is normal. There is normal left ventricular wall  thickness. Left ventricular systolic function is normal. LV ejection fraction  = 55-60%. Left ventricular filling pattern is indeterminate. The left  ventricular wall motion is normal. LV WALL MOTION -  RIGHT VENTRICLE The right ventricle is normal in size and function. The right ventricular  systolic function is normal. The right ventricular wall motion is normal.  LEFT ATRIUM The left atrium is borderline dilated.  RIGHT ATRIUM  Right atrial size is normal. - AORTIC VALVE The aortic valve is normal in structure and function. The aortic valve opens  well. Trace (trivial) aortic regurgitation. - MITRAL VALVE The mitral valve leaflets appear normal. There is no evidence of stenosis,  fluttering, or prolapse. There is mild mitral regurgitation. - TRICUSPID VALVE Structurally normal tricuspid valve. There is mild tricuspid regurgitation. - PULMONIC VALVE The pulmonic valve is normal in structure and function. Trace pulmonic  valvular regurgitation. - ARTERIES The aortic root is normal size. - VENOUS Pulmonary venous flow pattern is blunted. IVC size was normal. - EFFUSION There is no pericardial effusion. - - MMode/2D Measurements & Calculations IVSd: 0.95 cm LVIDd: 3.9 cm LVPWd: 0.99 cm LVIDs: 2.4 cm LA dim: 3.8 cm Ao root: 2.5 cm EDV(MOD-sp4): 24.0 ml ESV(MOD-sp4): 11.0 ml LVOT diam: 1.5 cm LAV(MOD-sp4): 41.5 ml Doppler Measurements & Calculations MV E max vel: 114.0 cm/sec MV A max vel: 82.4 cm/sec MV E/A: 1.4 Med Peak E' Vel: 7.6 cm/sec Lat Peak E' Vel: 12.2 cm/sec E/Lat E`: 9.4 E/Med  E`: 15.0 MV dec time: 0.16 sec SV(LVOT): 45.4 ml Ao mean PG: 7.1 mmHg AVA (VTI): 1.3 cm2 LV V1 VTI: 27.2 cm TR max vel: 230.8 cm/sec TR max PG: 21.3 mmHg RVSP(TR): 26.3 mmHg RAP systole: 5.0 mmHg    11/2014  echo Study Conclusions  - Left ventricle: The cavity size was normal. Systolic function was   normal. The estimated ejection fraction was in the range of 60%   to 65%. Wall motion was normal; there were no regional wall   motion abnormalities. Diastolic dysfunction, grade indeterminate.   Medial annular velocity is slightly low with elevated E/e&' ratio   of 18, indicative of elevated filling pressures. Mild to moderate   concentric left ventricular hypertrophy. - Aortic valve: Moderately calcified annulus. Trileaflet. There was   mild regurgitation. - Mitral valve: Mildly calcified annulus. Mildly thickened leaflets   . There was trivial regurgitation. - Right ventricle: Systolic function was normal. TAPSE: 17.6 mm . - Tricuspid valve: There was mild-moderate regurgitation. - Pulmonary arteries: Systolic pressure was mildly increased. PA   peak pressure: 35 mm Hg (S).     12/2014 Lexiscan  MPI No diagnostic ST segment abnormalities. Rare PACs. Very small fixed perfusion defect at the inferior apex most consistent with attenuation artifact rather than scar. No ischemic findings. This is a low risk study. Nuclear stress EF: 70%.   08/2016 echo Study Conclusions   - Left ventricle: The cavity size was normal. Wall thickness was   normal. Systolic function was normal. The estimated ejection   fraction was in the range of 60% to 65%. Features are consistent   with a pseudonormal left ventricular filling pattern, with   concomitant abnormal relaxation and increased filling pressure   (grade 2 diastolic dysfunction). Doppler parameters are   consistent with high ventricular filling pressure. - Aortic valve: Moderately calcified annulus. Trileaflet. There was   mild  regurgitation. - Mitral valve: Calcified annulus. There was mild regurgitation. - Right ventricle: Systolic function was mildly to moderately   reduced.     09/2020 echo IMPRESSIONS     1. Left ventricular ejection fraction, by estimation, is 60 to 65%. The  left ventricle has normal function. The left ventricle has no regional  wall motion abnormalities. Left ventricular diastolic parameters are  consistent with Grade I diastolic  dysfunction (impaired relaxation).   2. Right ventricular systolic function is normal. The right ventricular  size is normal. There is normal pulmonary artery systolic pressure.   3. The mitral valve is normal in structure. Trivial mitral valve  regurgitation. No evidence of mitral stenosis.   4. The aortic valve is calcified. There is moderate calcification of the  aortic valve. There is moderate thickening of the aortic valve. Aortic  valve regurgitation is trivial. Mild to moderate aortic valve  sclerosis/calcification is present, without any   evidence of aortic stenosis.   5. The inferior vena cava is normal in size with greater than 50%  respiratory variability, suggesting right atrial pressure of 3 mmHg.    Assessment and Plan   1. CAD - no ACE/ARB due to renal dysfunction - . On plavix  due to recurrent CVAs and most recently PAD intervention. Has not had a specific cardiac indication for plavix .  - no symptoms, continue current meds   2. Hyperlipidemia -very high risk given CAD, PAD, multiple CVAs and , thus LDL goal would be <55 - lipids at goal, check with pcp if more recent lipid panel   3. HTN -her bp is at goal, continue current meds        Dorn PHEBE Ross, M.D.

## 2023-12-25 ENCOUNTER — Encounter: Payer: Self-pay | Admitting: *Deleted

## 2023-12-26 DIAGNOSIS — E11649 Type 2 diabetes mellitus with hypoglycemia without coma: Secondary | ICD-10-CM | POA: Diagnosis not present

## 2023-12-26 DIAGNOSIS — E1021 Type 1 diabetes mellitus with diabetic nephropathy: Secondary | ICD-10-CM | POA: Diagnosis not present

## 2023-12-26 DIAGNOSIS — E875 Hyperkalemia: Secondary | ICD-10-CM | POA: Diagnosis not present

## 2023-12-26 DIAGNOSIS — I10 Essential (primary) hypertension: Secondary | ICD-10-CM | POA: Diagnosis not present

## 2023-12-26 DIAGNOSIS — N184 Chronic kidney disease, stage 4 (severe): Secondary | ICD-10-CM | POA: Diagnosis not present

## 2023-12-26 DIAGNOSIS — E039 Hypothyroidism, unspecified: Secondary | ICD-10-CM | POA: Diagnosis not present

## 2023-12-26 DIAGNOSIS — Z6821 Body mass index (BMI) 21.0-21.9, adult: Secondary | ICD-10-CM | POA: Diagnosis not present

## 2023-12-27 ENCOUNTER — Ambulatory Visit: Admitting: Cardiology

## 2023-12-31 DIAGNOSIS — N184 Chronic kidney disease, stage 4 (severe): Secondary | ICD-10-CM | POA: Diagnosis not present

## 2023-12-31 DIAGNOSIS — I10 Essential (primary) hypertension: Secondary | ICD-10-CM | POA: Diagnosis not present

## 2023-12-31 DIAGNOSIS — E1022 Type 1 diabetes mellitus with diabetic chronic kidney disease: Secondary | ICD-10-CM | POA: Diagnosis not present

## 2024-01-02 DIAGNOSIS — S61519A Laceration without foreign body of unspecified wrist, initial encounter: Secondary | ICD-10-CM | POA: Diagnosis not present

## 2024-01-02 DIAGNOSIS — Z6821 Body mass index (BMI) 21.0-21.9, adult: Secondary | ICD-10-CM | POA: Diagnosis not present

## 2024-01-02 DIAGNOSIS — E113553 Type 2 diabetes mellitus with stable proliferative diabetic retinopathy, bilateral: Secondary | ICD-10-CM | POA: Diagnosis not present

## 2024-01-02 DIAGNOSIS — Z794 Long term (current) use of insulin: Secondary | ICD-10-CM | POA: Diagnosis not present

## 2024-01-02 DIAGNOSIS — H353133 Nonexudative age-related macular degeneration, bilateral, advanced atrophic without subfoveal involvement: Secondary | ICD-10-CM | POA: Diagnosis not present

## 2024-01-02 DIAGNOSIS — S62109A Fracture of unspecified carpal bone, unspecified wrist, initial encounter for closed fracture: Secondary | ICD-10-CM | POA: Diagnosis not present

## 2024-01-02 DIAGNOSIS — H353212 Exudative age-related macular degeneration, right eye, with inactive choroidal neovascularization: Secondary | ICD-10-CM | POA: Diagnosis not present

## 2024-01-02 DIAGNOSIS — H353222 Exudative age-related macular degeneration, left eye, with inactive choroidal neovascularization: Secondary | ICD-10-CM | POA: Diagnosis not present

## 2024-01-02 DIAGNOSIS — H35372 Puckering of macula, left eye: Secondary | ICD-10-CM | POA: Diagnosis not present

## 2024-03-16 ENCOUNTER — Ambulatory Visit

## 2024-03-16 ENCOUNTER — Encounter (HOSPITAL_COMMUNITY)

## 2024-03-16 ENCOUNTER — Other Ambulatory Visit (HOSPITAL_COMMUNITY)
